# Patient Record
Sex: Male | Born: 1940 | Race: White | Hispanic: No | Marital: Married | Smoking: Former smoker
Health system: Southern US, Community
[De-identification: ages and names within clinical notes are randomized; demographics above are authoritative.]

## PROBLEM LIST (undated history)

## (undated) DIAGNOSIS — D751 Secondary polycythemia: Secondary | ICD-10-CM

## (undated) DIAGNOSIS — R600 Localized edema: Secondary | ICD-10-CM

## (undated) DIAGNOSIS — E785 Hyperlipidemia, unspecified: Secondary | ICD-10-CM

## (undated) DIAGNOSIS — E7800 Pure hypercholesterolemia, unspecified: Secondary | ICD-10-CM

## (undated) DIAGNOSIS — R238 Other skin changes: Secondary | ICD-10-CM

## (undated) DIAGNOSIS — I10 Essential (primary) hypertension: Secondary | ICD-10-CM

## (undated) DIAGNOSIS — I701 Atherosclerosis of renal artery: Secondary | ICD-10-CM

## (undated) DIAGNOSIS — C9110 Chronic lymphocytic leukemia of B-cell type not having achieved remission: Secondary | ICD-10-CM

## (undated) DIAGNOSIS — R609 Edema, unspecified: Secondary | ICD-10-CM

## (undated) DIAGNOSIS — R350 Frequency of micturition: Secondary | ICD-10-CM

## (undated) DIAGNOSIS — I219 Acute myocardial infarction, unspecified: Secondary | ICD-10-CM

## (undated) DIAGNOSIS — I509 Heart failure, unspecified: Secondary | ICD-10-CM

## (undated) DIAGNOSIS — I209 Angina pectoris, unspecified: Secondary | ICD-10-CM

## (undated) DIAGNOSIS — Z951 Presence of aortocoronary bypass graft: Secondary | ICD-10-CM

## (undated) DIAGNOSIS — M1990 Unspecified osteoarthritis, unspecified site: Secondary | ICD-10-CM

## (undated) DIAGNOSIS — Z89519 Acquired absence of unspecified leg below knee: Secondary | ICD-10-CM

## (undated) DIAGNOSIS — I739 Peripheral vascular disease, unspecified: Secondary | ICD-10-CM

## (undated) DIAGNOSIS — N400 Enlarged prostate without lower urinary tract symptoms: Secondary | ICD-10-CM

## (undated) DIAGNOSIS — I2510 Atherosclerotic heart disease of native coronary artery without angina pectoris: Secondary | ICD-10-CM

## (undated) DIAGNOSIS — G4700 Insomnia, unspecified: Secondary | ICD-10-CM

## (undated) DIAGNOSIS — F419 Anxiety disorder, unspecified: Secondary | ICD-10-CM

## (undated) DIAGNOSIS — R233 Spontaneous ecchymoses: Secondary | ICD-10-CM

## (undated) DIAGNOSIS — N183 Chronic kidney disease, stage 3 unspecified: Secondary | ICD-10-CM

## (undated) DIAGNOSIS — I4891 Unspecified atrial fibrillation: Secondary | ICD-10-CM

## (undated) DIAGNOSIS — G629 Polyneuropathy, unspecified: Secondary | ICD-10-CM

## (undated) DIAGNOSIS — Z9049 Acquired absence of other specified parts of digestive tract: Secondary | ICD-10-CM

## (undated) DIAGNOSIS — B029 Zoster without complications: Secondary | ICD-10-CM

## (undated) HISTORY — DX: Unspecified osteoarthritis, unspecified site: M19.90

## (undated) HISTORY — DX: Chronic lymphocytic leukemia of B-cell type not having achieved remission: C91.10

## (undated) HISTORY — DX: Pure hypercholesterolemia, unspecified: E78.00

## (undated) HISTORY — PX: PR VEIN BYPASS GRAFT,AORTO-FEM-POP: 35551

## (undated) HISTORY — DX: Heart failure, unspecified: I50.9

## (undated) HISTORY — PX: ESOPHAGOGASTRODUODENOSCOPY: SHX1529

## (undated) HISTORY — DX: Polyneuropathy, unspecified: G62.9

## (undated) HISTORY — DX: Atherosclerosis of renal artery: I70.1

## (undated) HISTORY — DX: Chronic kidney disease, stage 3 (moderate): N18.3

## (undated) HISTORY — DX: Chronic kidney disease, stage 3 unspecified: N18.30

## (undated) HISTORY — PX: TENDON REPAIR: SHX5111

## (undated) HISTORY — DX: Hyperlipidemia, unspecified: E78.5

## (undated) HISTORY — DX: Peripheral vascular disease, unspecified: I73.9

## (undated) HISTORY — DX: Unspecified atrial fibrillation: I48.91

## (undated) HISTORY — PX: CHOLECYSTECTOMY: SHX55

## (undated) HISTORY — DX: Secondary polycythemia: D75.1

## (undated) HISTORY — PX: TOTAL KNEE ARTHROPLASTY: SHX125

## (undated) HISTORY — PX: BELOW KNEE LEG AMPUTATION: SUR23

## (undated) HISTORY — PX: COLONOSCOPY: SHX174

---

## 1989-04-14 DIAGNOSIS — I219 Acute myocardial infarction, unspecified: Secondary | ICD-10-CM

## 1989-04-14 HISTORY — DX: Acute myocardial infarction, unspecified: I21.9

## 1991-04-15 HISTORY — PX: CORONARY ARTERY BYPASS GRAFT: SHX141

## 1992-04-14 HISTORY — PX: CORONARY ARTERY BYPASS GRAFT: SHX141

## 1996-04-14 HISTORY — PX: JOINT REPLACEMENT: SHX530

## 1997-12-04 ENCOUNTER — Ambulatory Visit (HOSPITAL_COMMUNITY): Admission: RE | Admit: 1997-12-04 | Discharge: 1997-12-04 | Payer: Self-pay | Admitting: Internal Medicine

## 1999-03-14 ENCOUNTER — Encounter: Payer: Self-pay | Admitting: Specialist

## 1999-03-14 ENCOUNTER — Ambulatory Visit (HOSPITAL_COMMUNITY): Admission: RE | Admit: 1999-03-14 | Discharge: 1999-03-14 | Payer: Self-pay | Admitting: Specialist

## 1999-03-26 ENCOUNTER — Encounter: Payer: Self-pay | Admitting: Specialist

## 1999-03-26 ENCOUNTER — Ambulatory Visit (HOSPITAL_COMMUNITY): Admission: RE | Admit: 1999-03-26 | Discharge: 1999-03-26 | Payer: Self-pay | Admitting: Specialist

## 1999-04-29 ENCOUNTER — Ambulatory Visit: Admission: RE | Admit: 1999-04-29 | Discharge: 1999-04-29 | Payer: Self-pay | Admitting: Internal Medicine

## 1999-06-03 ENCOUNTER — Ambulatory Visit (HOSPITAL_COMMUNITY): Admission: RE | Admit: 1999-06-03 | Discharge: 1999-06-03 | Payer: Self-pay | Admitting: *Deleted

## 1999-06-19 ENCOUNTER — Encounter: Payer: Self-pay | Admitting: *Deleted

## 1999-06-19 ENCOUNTER — Encounter: Admission: RE | Admit: 1999-06-19 | Discharge: 1999-06-19 | Payer: Self-pay | Admitting: *Deleted

## 2000-10-05 ENCOUNTER — Encounter: Admission: RE | Admit: 2000-10-05 | Discharge: 2000-10-05 | Payer: Self-pay | Admitting: Internal Medicine

## 2000-10-05 ENCOUNTER — Encounter: Payer: Self-pay | Admitting: Internal Medicine

## 2001-03-15 ENCOUNTER — Ambulatory Visit (HOSPITAL_COMMUNITY): Admission: RE | Admit: 2001-03-15 | Discharge: 2001-03-15 | Payer: Self-pay | Admitting: Cardiology

## 2001-03-15 ENCOUNTER — Encounter: Payer: Self-pay | Admitting: Cardiology

## 2001-04-14 HISTORY — PX: DISTAL CLAVICLE EXCISION: SHX1463

## 2001-04-14 HISTORY — PX: RENAL ARTERY STENT: SHX2321

## 2001-05-07 ENCOUNTER — Encounter: Payer: Self-pay | Admitting: Specialist

## 2001-05-07 ENCOUNTER — Ambulatory Visit (HOSPITAL_COMMUNITY): Admission: RE | Admit: 2001-05-07 | Discharge: 2001-05-07 | Payer: Self-pay | Admitting: Specialist

## 2001-08-10 ENCOUNTER — Ambulatory Visit (HOSPITAL_COMMUNITY): Admission: RE | Admit: 2001-08-10 | Discharge: 2001-08-10 | Payer: Self-pay | Admitting: Cardiology

## 2001-08-27 ENCOUNTER — Ambulatory Visit (HOSPITAL_COMMUNITY): Admission: RE | Admit: 2001-08-27 | Discharge: 2001-08-27 | Payer: Self-pay | Admitting: Specialist

## 2001-09-19 ENCOUNTER — Encounter: Payer: Self-pay | Admitting: Orthopaedic Surgery

## 2001-09-19 ENCOUNTER — Inpatient Hospital Stay (HOSPITAL_COMMUNITY): Admission: EM | Admit: 2001-09-19 | Discharge: 2001-09-23 | Payer: Self-pay | Admitting: Specialist

## 2001-09-23 ENCOUNTER — Encounter: Payer: Self-pay | Admitting: Specialist

## 2002-04-14 HISTORY — PX: CORONARY ANGIOPLASTY WITH STENT PLACEMENT: SHX49

## 2002-06-24 ENCOUNTER — Encounter: Payer: Self-pay | Admitting: Emergency Medicine

## 2002-06-24 ENCOUNTER — Inpatient Hospital Stay (HOSPITAL_COMMUNITY): Admission: EM | Admit: 2002-06-24 | Discharge: 2002-06-28 | Payer: Self-pay | Admitting: Emergency Medicine

## 2003-11-21 ENCOUNTER — Ambulatory Visit (HOSPITAL_COMMUNITY): Admission: RE | Admit: 2003-11-21 | Discharge: 2003-11-21 | Payer: Self-pay | Admitting: Internal Medicine

## 2003-11-30 ENCOUNTER — Ambulatory Visit (HOSPITAL_COMMUNITY): Admission: RE | Admit: 2003-11-30 | Discharge: 2003-11-30 | Payer: Self-pay | Admitting: Vascular Surgery

## 2003-12-12 ENCOUNTER — Ambulatory Visit (HOSPITAL_COMMUNITY): Admission: RE | Admit: 2003-12-12 | Discharge: 2003-12-12 | Payer: Self-pay | Admitting: Vascular Surgery

## 2003-12-21 ENCOUNTER — Ambulatory Visit (HOSPITAL_COMMUNITY): Admission: RE | Admit: 2003-12-21 | Discharge: 2003-12-21 | Payer: Self-pay | Admitting: Vascular Surgery

## 2004-03-03 ENCOUNTER — Ambulatory Visit: Payer: Self-pay | Admitting: Oncology

## 2004-04-14 HISTORY — PX: CARDIAC CATHETERIZATION: SHX172

## 2004-04-29 ENCOUNTER — Ambulatory Visit: Payer: Self-pay | Admitting: Oncology

## 2004-08-01 ENCOUNTER — Ambulatory Visit: Payer: Self-pay | Admitting: Oncology

## 2004-09-26 ENCOUNTER — Ambulatory Visit: Payer: Self-pay | Admitting: Oncology

## 2004-11-21 ENCOUNTER — Ambulatory Visit: Payer: Self-pay | Admitting: Oncology

## 2005-01-29 ENCOUNTER — Inpatient Hospital Stay (HOSPITAL_COMMUNITY): Admission: EM | Admit: 2005-01-29 | Discharge: 2005-01-31 | Payer: Self-pay | Admitting: Emergency Medicine

## 2005-02-21 ENCOUNTER — Ambulatory Visit: Payer: Self-pay | Admitting: Oncology

## 2005-03-28 ENCOUNTER — Ambulatory Visit (HOSPITAL_COMMUNITY): Admission: RE | Admit: 2005-03-28 | Discharge: 2005-03-28 | Payer: Self-pay | Admitting: Cardiovascular Disease

## 2005-04-10 ENCOUNTER — Ambulatory Visit: Payer: Self-pay | Admitting: Oncology

## 2005-05-08 ENCOUNTER — Ambulatory Visit: Payer: Self-pay | Admitting: Oncology

## 2005-05-24 ENCOUNTER — Inpatient Hospital Stay (HOSPITAL_COMMUNITY): Admission: EM | Admit: 2005-05-24 | Discharge: 2005-05-27 | Payer: Self-pay | Admitting: Emergency Medicine

## 2005-05-26 ENCOUNTER — Encounter: Payer: Self-pay | Admitting: Cardiovascular Disease

## 2005-06-23 ENCOUNTER — Ambulatory Visit: Payer: Self-pay | Admitting: Oncology

## 2005-08-04 LAB — CBC WITH DIFFERENTIAL/PLATELET
BASO%: 0.5 % (ref 0.0–2.0)
HCT: 39.2 % (ref 38.7–49.9)
MCHC: 30.4 g/dL — ABNORMAL LOW (ref 32.0–35.9)
MONO#: 1.1 10*3/uL — ABNORMAL HIGH (ref 0.1–0.9)
NEUT%: 80.7 % — ABNORMAL HIGH (ref 40.0–75.0)
RBC: 5.6 10*6/uL (ref 4.20–5.71)
RDW: 19.4 % — ABNORMAL HIGH (ref 11.2–14.6)
WBC: 11.5 10*3/uL — ABNORMAL HIGH (ref 4.0–10.0)
lymph#: 0.8 10*3/uL — ABNORMAL LOW (ref 0.9–3.3)

## 2005-09-15 ENCOUNTER — Ambulatory Visit: Payer: Self-pay | Admitting: Oncology

## 2005-09-15 LAB — CBC WITH DIFFERENTIAL/PLATELET
BASO%: 0.6 % (ref 0.0–2.0)
LYMPH%: 4.7 % — ABNORMAL LOW (ref 14.0–48.0)
MCHC: 30.3 g/dL — ABNORMAL LOW (ref 32.0–35.9)
MONO#: 1.3 10*3/uL — ABNORMAL HIGH (ref 0.1–0.9)
Platelets: 364 10*3/uL (ref 145–400)
RBC: 5.55 10*6/uL (ref 4.20–5.71)
WBC: 14.2 10*3/uL — ABNORMAL HIGH (ref 4.0–10.0)
lymph#: 0.7 10*3/uL — ABNORMAL LOW (ref 0.9–3.3)

## 2005-10-13 ENCOUNTER — Encounter: Admission: RE | Admit: 2005-10-13 | Discharge: 2005-10-13 | Payer: Self-pay | Admitting: Internal Medicine

## 2005-10-16 ENCOUNTER — Encounter: Admission: RE | Admit: 2005-10-16 | Discharge: 2005-10-16 | Payer: Self-pay | Admitting: Internal Medicine

## 2005-10-17 ENCOUNTER — Ambulatory Visit (HOSPITAL_COMMUNITY): Admission: RE | Admit: 2005-10-17 | Discharge: 2005-10-17 | Payer: Self-pay | Admitting: Internal Medicine

## 2005-10-27 ENCOUNTER — Ambulatory Visit: Payer: Self-pay | Admitting: Oncology

## 2005-10-27 LAB — CBC WITH DIFFERENTIAL/PLATELET
BASO%: 0.7 % (ref 0.0–2.0)
HCT: 42.2 % (ref 38.7–49.9)
HGB: 12.9 g/dL — ABNORMAL LOW (ref 13.0–17.1)
LYMPH%: 6.7 % — ABNORMAL LOW (ref 14.0–48.0)
MONO%: 11.2 % (ref 0.0–13.0)
NEUT#: 9.7 10*3/uL — ABNORMAL HIGH (ref 1.5–6.5)
RBC: 5.85 10*6/uL — ABNORMAL HIGH (ref 4.20–5.71)
WBC: 12.2 10*3/uL — ABNORMAL HIGH (ref 4.0–10.0)

## 2005-12-02 ENCOUNTER — Ambulatory Visit: Payer: Self-pay | Admitting: Oncology

## 2005-12-08 LAB — CBC WITH DIFFERENTIAL/PLATELET
BASO%: 0.1 % (ref 0.0–2.0)
EOS%: 2.2 % (ref 0.0–7.0)
HCT: 41.4 % (ref 38.7–49.9)
MCH: 22.6 pg — ABNORMAL LOW (ref 28.0–33.4)
MCHC: 31 g/dL — ABNORMAL LOW (ref 32.0–35.9)
MONO#: 1.4 10*3/uL — ABNORMAL HIGH (ref 0.1–0.9)
NEUT%: 85 % — ABNORMAL HIGH (ref 40.0–75.0)
RBC: 5.7 10*6/uL (ref 4.20–5.71)
RDW: 19.2 % — ABNORMAL HIGH (ref 11.2–14.6)
WBC: 17.1 10*3/uL — ABNORMAL HIGH (ref 4.0–10.0)
lymph#: 0.7 10*3/uL — ABNORMAL LOW (ref 0.9–3.3)

## 2006-01-12 ENCOUNTER — Inpatient Hospital Stay (HOSPITAL_COMMUNITY): Admission: EM | Admit: 2006-01-12 | Discharge: 2006-01-18 | Payer: Self-pay | Admitting: Emergency Medicine

## 2006-01-15 ENCOUNTER — Ambulatory Visit: Payer: Self-pay | Admitting: Oncology

## 2006-01-23 ENCOUNTER — Inpatient Hospital Stay (HOSPITAL_COMMUNITY): Admission: EM | Admit: 2006-01-23 | Discharge: 2006-01-25 | Payer: Self-pay | Admitting: Emergency Medicine

## 2006-01-24 ENCOUNTER — Encounter (INDEPENDENT_AMBULATORY_CARE_PROVIDER_SITE_OTHER): Payer: Self-pay | Admitting: *Deleted

## 2006-03-20 ENCOUNTER — Ambulatory Visit: Payer: Self-pay

## 2006-03-26 ENCOUNTER — Ambulatory Visit (HOSPITAL_COMMUNITY): Admission: RE | Admit: 2006-03-26 | Discharge: 2006-03-26 | Payer: Self-pay | Admitting: Vascular Surgery

## 2006-03-31 ENCOUNTER — Ambulatory Visit: Payer: Self-pay | Admitting: Oncology

## 2006-04-03 LAB — CBC WITH DIFFERENTIAL/PLATELET
BASO%: 0.1 % (ref 0.0–2.0)
EOS%: 4.5 % (ref 0.0–7.0)
LYMPH%: 7.2 % — ABNORMAL LOW (ref 14.0–48.0)
MCH: 22.1 pg — ABNORMAL LOW (ref 28.0–33.4)
MCHC: 30.5 g/dL — ABNORMAL LOW (ref 32.0–35.9)
MONO#: 1.3 10*3/uL — ABNORMAL HIGH (ref 0.1–0.9)
Platelets: 379 10*3/uL (ref 145–400)
RBC: 6.15 10*6/uL — ABNORMAL HIGH (ref 4.20–5.71)
WBC: 13.9 10*3/uL — ABNORMAL HIGH (ref 4.0–10.0)

## 2006-06-02 ENCOUNTER — Ambulatory Visit: Payer: Self-pay | Admitting: Oncology

## 2006-06-05 LAB — COMPREHENSIVE METABOLIC PANEL
ALT: 16 U/L (ref 0–53)
AST: 15 U/L (ref 0–37)
Albumin: 4.1 g/dL (ref 3.5–5.2)
CO2: 29 mEq/L (ref 19–32)
Calcium: 9.5 mg/dL (ref 8.4–10.5)
Chloride: 98 mEq/L (ref 96–112)
Creatinine, Ser: 2.17 mg/dL — ABNORMAL HIGH (ref 0.40–1.50)
Potassium: 4.1 mEq/L (ref 3.5–5.3)
Total Protein: 7.2 g/dL (ref 6.0–8.3)

## 2006-06-05 LAB — CBC WITH DIFFERENTIAL/PLATELET
Basophils Absolute: 0 10*3/uL (ref 0.0–0.1)
Eosinophils Absolute: 0.4 10*3/uL (ref 0.0–0.5)
HCT: 42.1 % (ref 38.7–49.9)
HGB: 13 g/dL (ref 13.0–17.1)
MONO#: 1.1 10*3/uL — ABNORMAL HIGH (ref 0.1–0.9)
NEUT#: 11.2 10*3/uL — ABNORMAL HIGH (ref 1.5–6.5)
NEUT%: 83.5 % — ABNORMAL HIGH (ref 40.0–75.0)
WBC: 13.4 10*3/uL — ABNORMAL HIGH (ref 4.0–10.0)
lymph#: 0.7 10*3/uL — ABNORMAL LOW (ref 0.9–3.3)

## 2006-09-02 ENCOUNTER — Ambulatory Visit: Payer: Self-pay | Admitting: Oncology

## 2006-09-04 LAB — CBC WITH DIFFERENTIAL/PLATELET
BASO%: 1.1 % (ref 0.0–2.0)
Basophils Absolute: 0.1 10*3/uL (ref 0.0–0.1)
EOS%: 2.6 % (ref 0.0–7.0)
Eosinophils Absolute: 0.3 10*3/uL (ref 0.0–0.5)
HCT: 42.9 % (ref 38.7–49.9)
HGB: 13.3 g/dL (ref 13.0–17.1)
LYMPH%: 4.9 % — ABNORMAL LOW (ref 14.0–48.0)
MCH: 21.9 pg — ABNORMAL LOW (ref 28.0–33.4)
MCHC: 31 g/dL — ABNORMAL LOW (ref 32.0–35.9)
MCV: 70.7 fL — ABNORMAL LOW (ref 81.6–98.0)
MONO#: 1.5 10*3/uL — ABNORMAL HIGH (ref 0.1–0.9)
MONO%: 11.5 % (ref 0.0–13.0)
NEUT#: 10.5 10*3/uL — ABNORMAL HIGH (ref 1.5–6.5)
NEUT%: 79.9 % — ABNORMAL HIGH (ref 40.0–75.0)
Platelets: 326 10*3/uL (ref 145–400)
RBC: 6.06 10*6/uL — ABNORMAL HIGH (ref 4.20–5.71)
RDW: 20.1 % — ABNORMAL HIGH (ref 11.2–14.6)
WBC: 13.2 10*3/uL — ABNORMAL HIGH (ref 4.0–10.0)
lymph#: 0.6 10*3/uL — ABNORMAL LOW (ref 0.9–3.3)

## 2006-09-09 ENCOUNTER — Encounter: Admission: RE | Admit: 2006-09-09 | Discharge: 2006-09-09 | Payer: Self-pay | Admitting: Internal Medicine

## 2006-09-16 ENCOUNTER — Encounter: Admission: RE | Admit: 2006-09-16 | Discharge: 2006-09-16 | Payer: Self-pay | Admitting: Cardiovascular Disease

## 2006-09-16 ENCOUNTER — Ambulatory Visit: Payer: Self-pay | Admitting: Vascular Surgery

## 2006-12-02 ENCOUNTER — Ambulatory Visit: Payer: Self-pay | Admitting: Oncology

## 2006-12-04 LAB — CBC WITH DIFFERENTIAL/PLATELET
Basophils Absolute: 0.1 10*3/uL (ref 0.0–0.1)
EOS%: 2.3 % (ref 0.0–7.0)
HCT: 46.8 % (ref 38.7–49.9)
HGB: 14.5 g/dL (ref 13.0–17.1)
MCH: 22.2 pg — ABNORMAL LOW (ref 28.0–33.4)
MONO#: 1.5 10*3/uL — ABNORMAL HIGH (ref 0.1–0.9)
NEUT#: 11.9 10*3/uL — ABNORMAL HIGH (ref 1.5–6.5)
NEUT%: 81.2 % — ABNORMAL HIGH (ref 40.0–75.0)
RDW: 19.4 % — ABNORMAL HIGH (ref 11.2–14.6)
WBC: 14.6 10*3/uL — ABNORMAL HIGH (ref 4.0–10.0)
lymph#: 0.9 10*3/uL (ref 0.9–3.3)

## 2007-03-02 ENCOUNTER — Ambulatory Visit: Payer: Self-pay | Admitting: Oncology

## 2007-03-04 LAB — CBC WITH DIFFERENTIAL/PLATELET
Basophils Absolute: 0 10*3/uL (ref 0.0–0.1)
Eosinophils Absolute: 0.3 10*3/uL (ref 0.0–0.5)
HCT: 41.5 % (ref 38.7–49.9)
HGB: 13 g/dL (ref 13.0–17.1)
MCH: 21.7 pg — ABNORMAL LOW (ref 28.0–33.4)
MCV: 69.6 fL — ABNORMAL LOW (ref 81.6–98.0)
MONO%: 8.9 % (ref 0.0–13.0)
NEUT#: 13.6 10*3/uL — ABNORMAL HIGH (ref 1.5–6.5)
NEUT%: 85.7 % — ABNORMAL HIGH (ref 40.0–75.0)
RDW: 19.7 % — ABNORMAL HIGH (ref 11.2–14.6)
lymph#: 0.6 10*3/uL — ABNORMAL LOW (ref 0.9–3.3)

## 2007-04-21 ENCOUNTER — Encounter: Admission: RE | Admit: 2007-04-21 | Discharge: 2007-04-21 | Payer: Self-pay | Admitting: Orthopedic Surgery

## 2007-06-01 ENCOUNTER — Ambulatory Visit: Payer: Self-pay | Admitting: Oncology

## 2007-06-02 LAB — CBC WITH DIFFERENTIAL/PLATELET
BASO%: 0 % (ref 0.0–2.0)
LYMPH%: 2.9 % — ABNORMAL LOW (ref 14.0–48.0)
MCH: 22.8 pg — ABNORMAL LOW (ref 28.0–33.4)
MCHC: 31.5 g/dL — ABNORMAL LOW (ref 32.0–35.9)
MCV: 72.4 fL — ABNORMAL LOW (ref 81.6–98.0)
MONO%: 10.2 % (ref 0.0–13.0)
Platelets: 596 10*3/uL — ABNORMAL HIGH (ref 145–400)
RBC: 6.41 10*6/uL — ABNORMAL HIGH (ref 4.20–5.71)

## 2007-06-29 ENCOUNTER — Inpatient Hospital Stay (HOSPITAL_COMMUNITY): Admission: EM | Admit: 2007-06-29 | Discharge: 2007-07-02 | Payer: Self-pay | Admitting: Emergency Medicine

## 2007-07-09 ENCOUNTER — Ambulatory Visit: Payer: Self-pay | Admitting: Oncology

## 2007-07-13 LAB — CBC WITH DIFFERENTIAL/PLATELET
BASO%: 0.5 % (ref 0.0–2.0)
Basophils Absolute: 0.1 10*3/uL (ref 0.0–0.1)
EOS%: 1.3 % (ref 0.0–7.0)
Eosinophils Absolute: 0.2 10*3/uL (ref 0.0–0.5)
HCT: 39.2 % (ref 38.7–49.9)
HGB: 12.2 g/dL — ABNORMAL LOW (ref 13.0–17.1)
LYMPH%: 3.5 % — ABNORMAL LOW (ref 14.0–48.0)
MCH: 23.6 pg — ABNORMAL LOW (ref 28.0–33.4)
MCHC: 31.3 g/dL — ABNORMAL LOW (ref 32.0–35.9)
MCV: 75.3 fL — ABNORMAL LOW (ref 81.6–98.0)
MONO#: 1.5 10*3/uL — ABNORMAL HIGH (ref 0.1–0.9)
MONO%: 8.4 % (ref 0.0–13.0)
NEUT#: 14.9 10*3/uL — ABNORMAL HIGH (ref 1.5–6.5)
NEUT%: 86.3 % — ABNORMAL HIGH (ref 40.0–75.0)
Platelets: 541 10*3/uL — ABNORMAL HIGH (ref 145–400)
RBC: 5.2 10*6/uL (ref 4.20–5.71)
RDW: 20.2 % — ABNORMAL HIGH (ref 11.2–14.6)
WBC: 17.2 10*3/uL — ABNORMAL HIGH (ref 4.0–10.0)
lymph#: 0.6 10*3/uL — ABNORMAL LOW (ref 0.9–3.3)

## 2007-07-20 ENCOUNTER — Ambulatory Visit: Payer: Self-pay | Admitting: Vascular Surgery

## 2007-08-31 ENCOUNTER — Ambulatory Visit: Payer: Self-pay | Admitting: Oncology

## 2007-09-02 LAB — CBC WITH DIFFERENTIAL/PLATELET
Basophils Absolute: 0 10*3/uL (ref 0.0–0.1)
Eosinophils Absolute: 0.5 10*3/uL (ref 0.0–0.5)
HGB: 13.5 g/dL (ref 13.0–17.1)
MCV: 70.7 fL — ABNORMAL LOW (ref 81.6–98.0)
MONO%: 7.5 % (ref 0.0–13.0)
NEUT#: 18.4 10*3/uL — ABNORMAL HIGH (ref 1.5–6.5)
Platelets: 268 10*3/uL (ref 145–400)
RDW: 19.8 % — ABNORMAL HIGH (ref 11.2–14.6)

## 2007-12-31 ENCOUNTER — Ambulatory Visit: Payer: Self-pay | Admitting: Oncology

## 2008-01-03 ENCOUNTER — Ambulatory Visit: Payer: Self-pay | Admitting: Vascular Surgery

## 2008-01-04 LAB — CBC WITH DIFFERENTIAL/PLATELET
Basophils Absolute: 0.1 10*3/uL (ref 0.0–0.1)
EOS%: 2.3 % (ref 0.0–7.0)
Eosinophils Absolute: 0.4 10*3/uL (ref 0.0–0.5)
HGB: 14.1 g/dL (ref 13.0–17.1)
MONO#: 1.3 10*3/uL — ABNORMAL HIGH (ref 0.1–0.9)
NEUT#: 15.6 10*3/uL — ABNORMAL HIGH (ref 1.5–6.5)
RDW: 19.9 % — ABNORMAL HIGH (ref 11.2–14.6)
WBC: 17.9 10*3/uL — ABNORMAL HIGH (ref 4.0–10.0)
lymph#: 0.6 10*3/uL — ABNORMAL LOW (ref 0.9–3.3)

## 2008-01-18 ENCOUNTER — Ambulatory Visit: Payer: Self-pay | Admitting: Vascular Surgery

## 2008-02-29 ENCOUNTER — Ambulatory Visit: Payer: Self-pay | Admitting: Oncology

## 2008-03-02 LAB — CBC WITH DIFFERENTIAL/PLATELET
BASO%: 0.2 % (ref 0.0–2.0)
EOS%: 2.1 % (ref 0.0–7.0)
MCH: 21.6 pg — ABNORMAL LOW (ref 28.0–33.4)
MCHC: 29.8 g/dL — ABNORMAL LOW (ref 32.0–35.9)
RDW: 20.2 % — ABNORMAL HIGH (ref 11.2–14.6)
lymph#: 0.7 10*3/uL — ABNORMAL LOW (ref 0.9–3.3)

## 2008-05-30 ENCOUNTER — Ambulatory Visit: Payer: Self-pay | Admitting: Oncology

## 2008-06-02 LAB — CBC WITH DIFFERENTIAL/PLATELET
Basophils Absolute: 0.1 10*3/uL (ref 0.0–0.1)
Eosinophils Absolute: 0.3 10*3/uL (ref 0.0–0.5)
HGB: 13.5 g/dL (ref 13.0–17.1)
LYMPH%: 3.9 % — ABNORMAL LOW (ref 14.0–49.0)
MCV: 77.9 fL — ABNORMAL LOW (ref 79.3–98.0)
MONO%: 9.2 % (ref 0.0–14.0)
NEUT#: 13.7 10*3/uL — ABNORMAL HIGH (ref 1.5–6.5)
Platelets: 156 10*3/uL (ref 140–400)

## 2008-07-14 LAB — CBC WITH DIFFERENTIAL/PLATELET
Basophils Absolute: 0 10*3/uL (ref 0.0–0.1)
Eosinophils Absolute: 0.3 10*3/uL (ref 0.0–0.5)
HGB: 13.7 g/dL (ref 13.0–17.1)
MONO#: 1.6 10*3/uL — ABNORMAL HIGH (ref 0.1–0.9)
NEUT#: 13.2 10*3/uL — ABNORMAL HIGH (ref 1.5–6.5)
RDW: 18.7 % — ABNORMAL HIGH (ref 11.0–14.6)
lymph#: 0.9 10*3/uL (ref 0.9–3.3)

## 2008-08-25 ENCOUNTER — Ambulatory Visit: Payer: Self-pay | Admitting: Oncology

## 2008-08-29 LAB — CBC WITH DIFFERENTIAL/PLATELET
BASO%: 0.2 % (ref 0.0–2.0)
Basophils Absolute: 0 10*3/uL (ref 0.0–0.1)
EOS%: 0.8 % (ref 0.0–7.0)
HGB: 15.8 g/dL (ref 13.0–17.1)
MCH: 22.4 pg — ABNORMAL LOW (ref 27.2–33.4)
RDW: 19.2 % — ABNORMAL HIGH (ref 11.0–14.6)
lymph#: 0.7 10*3/uL — ABNORMAL LOW (ref 0.9–3.3)

## 2008-09-20 LAB — CBC WITH DIFFERENTIAL/PLATELET
BASO%: 0.6 % (ref 0.0–2.0)
EOS%: 1.2 % (ref 0.0–7.0)
LYMPH%: 4.8 % — ABNORMAL LOW (ref 14.0–49.0)
MCH: 23 pg — ABNORMAL LOW (ref 27.2–33.4)
MCHC: 29.2 g/dL — ABNORMAL LOW (ref 32.0–36.0)
MCV: 78.5 fL — ABNORMAL LOW (ref 79.3–98.0)
MONO%: 8.7 % (ref 0.0–14.0)
Platelets: 312 10*3/uL (ref 140–400)
RBC: 6.23 10*6/uL — ABNORMAL HIGH (ref 4.20–5.82)
nRBC: 0 % (ref 0–0)

## 2008-10-02 ENCOUNTER — Ambulatory Visit: Payer: Self-pay | Admitting: Oncology

## 2008-10-04 LAB — CBC WITH DIFFERENTIAL/PLATELET
BASO%: 0.5 % (ref 0.0–2.0)
EOS%: 1.1 % (ref 0.0–7.0)
HCT: 44 % (ref 38.4–49.9)
MCH: 23.1 pg — ABNORMAL LOW (ref 27.2–33.4)
MCHC: 29.3 g/dL — ABNORMAL LOW (ref 32.0–36.0)
NEUT%: 85.8 % — ABNORMAL HIGH (ref 39.0–75.0)
RBC: 5.59 10*6/uL (ref 4.20–5.82)
RDW: 18.5 % — ABNORMAL HIGH (ref 11.0–14.6)
lymph#: 0.6 10*3/uL — ABNORMAL LOW (ref 0.9–3.3)

## 2008-10-18 LAB — CBC WITH DIFFERENTIAL/PLATELET
Basophils Absolute: 0.1 10*3/uL (ref 0.0–0.1)
EOS%: 1.7 % (ref 0.0–7.0)
Eosinophils Absolute: 0.3 10*3/uL (ref 0.0–0.5)
HGB: 11.7 g/dL — ABNORMAL LOW (ref 13.0–17.1)
LYMPH%: 4.8 % — ABNORMAL LOW (ref 14.0–49.0)
MCH: 22.4 pg — ABNORMAL LOW (ref 27.2–33.4)
MCV: 77.8 fL — ABNORMAL LOW (ref 79.3–98.0)
MONO%: 7.3 % (ref 0.0–14.0)
NEUT#: 14.9 10*3/uL — ABNORMAL HIGH (ref 1.5–6.5)
Platelets: 264 10*3/uL (ref 140–400)

## 2008-12-19 ENCOUNTER — Ambulatory Visit: Payer: Self-pay | Admitting: Oncology

## 2008-12-21 LAB — CBC WITH DIFFERENTIAL/PLATELET
BASO%: 0 % (ref 0.0–2.0)
Basophils Absolute: 0 10*3/uL (ref 0.0–0.1)
EOS%: 1.7 % (ref 0.0–7.0)
HCT: 40.7 % (ref 38.4–49.9)
HGB: 12 g/dL — ABNORMAL LOW (ref 13.0–17.1)
LYMPH%: 5.1 % — ABNORMAL LOW (ref 14.0–49.0)
MCH: 21.4 pg — ABNORMAL LOW (ref 27.2–33.4)
MCHC: 29.4 g/dL — ABNORMAL LOW (ref 32.0–36.0)
MCV: 72.9 fL — ABNORMAL LOW (ref 79.3–98.0)
MONO%: 5.7 % (ref 0.0–14.0)
NEUT%: 87.5 % — ABNORMAL HIGH (ref 39.0–75.0)
lymph#: 0.9 10*3/uL (ref 0.9–3.3)

## 2009-02-05 ENCOUNTER — Inpatient Hospital Stay (HOSPITAL_COMMUNITY): Admission: EM | Admit: 2009-02-05 | Discharge: 2009-02-08 | Payer: Self-pay | Admitting: Emergency Medicine

## 2009-05-04 ENCOUNTER — Ambulatory Visit: Payer: Self-pay | Admitting: Oncology

## 2009-05-04 LAB — CBC WITH DIFFERENTIAL/PLATELET
Basophils Absolute: 0 10*3/uL (ref 0.0–0.1)
Eosinophils Absolute: 0.4 10*3/uL (ref 0.0–0.5)
HCT: 46.3 % (ref 38.4–49.9)
HGB: 14 g/dL (ref 13.0–17.1)
LYMPH%: 3.2 % — ABNORMAL LOW (ref 14.0–49.0)
MONO#: 1.8 10*3/uL — ABNORMAL HIGH (ref 0.1–0.9)
NEUT#: 22.7 10*3/uL — ABNORMAL HIGH (ref 1.5–6.5)
NEUT%: 88.3 % — ABNORMAL HIGH (ref 39.0–75.0)
Platelets: 349 10*3/uL (ref 140–400)
WBC: 25.7 10*3/uL — ABNORMAL HIGH (ref 4.0–10.3)
lymph#: 0.8 10*3/uL — ABNORMAL LOW (ref 0.9–3.3)

## 2009-05-31 LAB — CBC WITH DIFFERENTIAL/PLATELET
BASO%: 0 % (ref 0.0–2.0)
Basophils Absolute: 0 10*3/uL (ref 0.0–0.1)
EOS%: 1.5 % (ref 0.0–7.0)
HCT: 47.5 % (ref 38.4–49.9)
HGB: 14.1 g/dL (ref 13.0–17.1)
MCH: 23.1 pg — ABNORMAL LOW (ref 27.2–33.4)
MCHC: 29.7 g/dL — ABNORMAL LOW (ref 32.0–36.0)
MCV: 77.9 fL — ABNORMAL LOW (ref 79.3–98.0)
MONO%: 5 % (ref 0.0–14.0)
NEUT%: 91.1 % — ABNORMAL HIGH (ref 39.0–75.0)
lymph#: 0.7 10*3/uL — ABNORMAL LOW (ref 0.9–3.3)

## 2009-06-26 ENCOUNTER — Ambulatory Visit: Payer: Self-pay | Admitting: Oncology

## 2009-06-28 LAB — CBC WITH DIFFERENTIAL/PLATELET
BASO%: 0.1 % (ref 0.0–2.0)
Basophils Absolute: 0 10*3/uL (ref 0.0–0.1)
EOS%: 1.6 % (ref 0.0–7.0)
HCT: 46.3 % (ref 38.4–49.9)
HGB: 14 g/dL (ref 13.0–17.1)
LYMPH%: 4.2 % — ABNORMAL LOW (ref 14.0–49.0)
MCH: 24.3 pg — ABNORMAL LOW (ref 27.2–33.4)
MCHC: 30.1 g/dL — ABNORMAL LOW (ref 32.0–36.0)
MONO#: 1.3 10*3/uL — ABNORMAL HIGH (ref 0.1–0.9)
NEUT%: 87.2 % — ABNORMAL HIGH (ref 39.0–75.0)
Platelets: 231 10*3/uL (ref 140–400)

## 2009-07-17 ENCOUNTER — Inpatient Hospital Stay (HOSPITAL_COMMUNITY): Admission: RE | Admit: 2009-07-17 | Discharge: 2009-07-24 | Payer: Self-pay | Admitting: Orthopedic Surgery

## 2009-09-24 ENCOUNTER — Encounter: Admission: RE | Admit: 2009-09-24 | Discharge: 2009-09-24 | Payer: Self-pay | Admitting: Internal Medicine

## 2009-09-26 ENCOUNTER — Ambulatory Visit: Payer: Self-pay | Admitting: Vascular Surgery

## 2009-10-05 ENCOUNTER — Ambulatory Visit (HOSPITAL_COMMUNITY): Admission: RE | Admit: 2009-10-05 | Discharge: 2009-10-05 | Payer: Self-pay | Admitting: Vascular Surgery

## 2009-10-05 ENCOUNTER — Ambulatory Visit: Payer: Self-pay | Admitting: Vascular Surgery

## 2009-10-08 ENCOUNTER — Encounter: Admission: RE | Admit: 2009-10-08 | Discharge: 2009-10-08 | Payer: Self-pay | Admitting: Orthopedic Surgery

## 2009-10-10 ENCOUNTER — Ambulatory Visit: Payer: Self-pay | Admitting: Vascular Surgery

## 2009-10-12 ENCOUNTER — Encounter: Payer: Self-pay | Admitting: Vascular Surgery

## 2009-10-12 ENCOUNTER — Inpatient Hospital Stay (HOSPITAL_COMMUNITY): Admission: RE | Admit: 2009-10-12 | Discharge: 2009-10-17 | Payer: Self-pay | Admitting: Vascular Surgery

## 2009-10-16 ENCOUNTER — Ambulatory Visit: Payer: Self-pay | Admitting: Physical Medicine & Rehabilitation

## 2009-10-17 ENCOUNTER — Inpatient Hospital Stay (HOSPITAL_COMMUNITY)
Admission: RE | Admit: 2009-10-17 | Discharge: 2009-10-27 | Payer: Self-pay | Admitting: Physical Medicine & Rehabilitation

## 2009-10-31 ENCOUNTER — Ambulatory Visit: Payer: Self-pay | Admitting: Vascular Surgery

## 2009-11-06 ENCOUNTER — Inpatient Hospital Stay (HOSPITAL_COMMUNITY): Admission: EM | Admit: 2009-11-06 | Discharge: 2009-11-19 | Payer: Self-pay | Admitting: Emergency Medicine

## 2009-11-07 ENCOUNTER — Ambulatory Visit: Payer: Self-pay | Admitting: Surgery

## 2009-11-09 ENCOUNTER — Ambulatory Visit: Payer: Self-pay | Admitting: Infectious Diseases

## 2009-12-03 ENCOUNTER — Encounter
Admission: RE | Admit: 2009-12-03 | Discharge: 2010-03-03 | Payer: Self-pay | Admitting: Physical Medicine & Rehabilitation

## 2009-12-06 ENCOUNTER — Ambulatory Visit: Payer: Self-pay | Admitting: Vascular Surgery

## 2009-12-20 ENCOUNTER — Ambulatory Visit: Payer: Self-pay | Admitting: Vascular Surgery

## 2010-01-03 ENCOUNTER — Ambulatory Visit: Payer: Self-pay | Admitting: Vascular Surgery

## 2010-01-24 ENCOUNTER — Ambulatory Visit: Payer: Self-pay | Admitting: Vascular Surgery

## 2010-03-06 ENCOUNTER — Encounter
Admission: RE | Admit: 2010-03-06 | Discharge: 2010-04-10 | Payer: Self-pay | Source: Home / Self Care | Attending: Vascular Surgery | Admitting: Vascular Surgery

## 2010-04-03 ENCOUNTER — Encounter: Admission: RE | Admit: 2010-04-03 | Payer: Self-pay | Source: Home / Self Care | Admitting: Vascular Surgery

## 2010-04-15 ENCOUNTER — Encounter
Admission: RE | Admit: 2010-04-15 | Discharge: 2010-05-14 | Payer: Self-pay | Source: Home / Self Care | Attending: Vascular Surgery | Admitting: Vascular Surgery

## 2010-05-13 ENCOUNTER — Encounter: Admit: 2010-05-13 | Payer: Self-pay | Admitting: Vascular Surgery

## 2010-05-13 ENCOUNTER — Encounter: Admit: 2010-05-13 | Discharge: 2010-05-14 | Payer: Self-pay | Attending: Vascular Surgery | Admitting: Vascular Surgery

## 2010-05-15 ENCOUNTER — Ambulatory Visit: Payer: MEDICARE | Attending: Vascular Surgery | Admitting: Physical Therapy

## 2010-05-15 DIAGNOSIS — G20A1 Parkinson's disease without dyskinesia, without mention of fluctuations: Secondary | ICD-10-CM | POA: Insufficient documentation

## 2010-05-15 DIAGNOSIS — G20 Parkinson's disease: Secondary | ICD-10-CM | POA: Insufficient documentation

## 2010-05-15 DIAGNOSIS — IMO0001 Reserved for inherently not codable concepts without codable children: Secondary | ICD-10-CM | POA: Insufficient documentation

## 2010-05-15 DIAGNOSIS — R269 Unspecified abnormalities of gait and mobility: Secondary | ICD-10-CM | POA: Insufficient documentation

## 2010-05-20 ENCOUNTER — Ambulatory Visit: Payer: MEDICARE | Admitting: Physical Therapy

## 2010-05-22 ENCOUNTER — Ambulatory Visit: Payer: MEDICARE | Admitting: Physical Therapy

## 2010-05-27 ENCOUNTER — Ambulatory Visit: Payer: MEDICARE | Admitting: Physical Therapy

## 2010-05-29 ENCOUNTER — Encounter: Payer: Self-pay | Admitting: Physical Therapy

## 2010-06-03 ENCOUNTER — Encounter: Payer: Self-pay | Admitting: Physical Therapy

## 2010-06-05 ENCOUNTER — Encounter: Payer: Self-pay | Admitting: Physical Therapy

## 2010-06-28 LAB — BASIC METABOLIC PANEL
BUN: 31 mg/dL — ABNORMAL HIGH (ref 6–23)
BUN: 37 mg/dL — ABNORMAL HIGH (ref 6–23)
BUN: 39 mg/dL — ABNORMAL HIGH (ref 6–23)
CO2: 29 mEq/L (ref 19–32)
Calcium: 9 mg/dL (ref 8.4–10.5)
Calcium: 9.1 mg/dL (ref 8.4–10.5)
Chloride: 96 mEq/L (ref 96–112)
Chloride: 96 mEq/L (ref 96–112)
Creatinine, Ser: 1.38 mg/dL (ref 0.4–1.5)
Creatinine, Ser: 1.5 mg/dL (ref 0.4–1.5)
Creatinine, Ser: 1.74 mg/dL — ABNORMAL HIGH (ref 0.4–1.5)
GFR calc Af Amer: 56 mL/min — ABNORMAL LOW (ref >60)
GFR calc Af Amer: >60 mL/min (ref >60)
GFR calc non Af Amer: 51 mL/min — ABNORMAL LOW (ref >60)

## 2010-06-28 LAB — MAGNESIUM: Magnesium: 2.1 mg/dL (ref 1.5–2.5)

## 2010-06-28 LAB — GLUCOSE, CAPILLARY
Glucose-Capillary: 101 mg/dL — ABNORMAL HIGH (ref 70–99)
Glucose-Capillary: 113 mg/dL — ABNORMAL HIGH (ref 70–99)
Glucose-Capillary: 116 mg/dL — ABNORMAL HIGH (ref 70–99)
Glucose-Capillary: 120 mg/dL — ABNORMAL HIGH (ref 70–99)
Glucose-Capillary: 122 mg/dL — ABNORMAL HIGH (ref 70–99)
Glucose-Capillary: 130 mg/dL — ABNORMAL HIGH (ref 70–99)
Glucose-Capillary: 133 mg/dL — ABNORMAL HIGH (ref 70–99)
Glucose-Capillary: 135 mg/dL — ABNORMAL HIGH (ref 70–99)
Glucose-Capillary: 136 mg/dL — ABNORMAL HIGH (ref 70–99)
Glucose-Capillary: 142 mg/dL — ABNORMAL HIGH (ref 70–99)
Glucose-Capillary: 149 mg/dL — ABNORMAL HIGH (ref 70–99)
Glucose-Capillary: 159 mg/dL — ABNORMAL HIGH (ref 70–99)
Glucose-Capillary: 160 mg/dL — ABNORMAL HIGH (ref 70–99)
Glucose-Capillary: 161 mg/dL — ABNORMAL HIGH (ref 70–99)
Glucose-Capillary: 163 mg/dL — ABNORMAL HIGH (ref 70–99)
Glucose-Capillary: 164 mg/dL — ABNORMAL HIGH (ref 70–99)
Glucose-Capillary: 165 mg/dL — ABNORMAL HIGH (ref 70–99)
Glucose-Capillary: 192 mg/dL — ABNORMAL HIGH (ref 70–99)
Glucose-Capillary: 195 mg/dL — ABNORMAL HIGH (ref 70–99)
Glucose-Capillary: 198 mg/dL — ABNORMAL HIGH (ref 70–99)
Glucose-Capillary: 227 mg/dL — ABNORMAL HIGH (ref 70–99)

## 2010-06-28 LAB — COMPREHENSIVE METABOLIC PANEL
ALT: 11 U/L (ref 0–53)
AST: 15 U/L (ref 0–37)
Albumin: 2.1 g/dL — ABNORMAL LOW (ref 3.5–5.2)
CO2: 32 mEq/L (ref 19–32)
Calcium: 8.8 mg/dL (ref 8.4–10.5)
Creatinine, Ser: 1.5 mg/dL (ref 0.4–1.5)
GFR calc Af Amer: 56 mL/min — ABNORMAL LOW (ref >60)
GFR calc non Af Amer: 46 mL/min — ABNORMAL LOW (ref >60)
Sodium: 137 mEq/L (ref 135–145)
Total Protein: 5.7 g/dL — ABNORMAL LOW (ref 6.0–8.3)

## 2010-06-28 LAB — WOUND CULTURE

## 2010-06-28 LAB — CBC
Hemoglobin: 10.8 g/dL — ABNORMAL LOW (ref 13.0–17.0)
MCH: 21.5 pg — ABNORMAL LOW (ref 26.0–34.0)
MCHC: 27.8 g/dL — ABNORMAL LOW (ref 30.0–36.0)
Platelets: 408 10*3/uL — ABNORMAL HIGH (ref 150–400)
Platelets: 437 10*3/uL — ABNORMAL HIGH (ref 150–400)
RBC: 4.97 MIL/uL (ref 4.22–5.81)
RDW: 19.1 % — ABNORMAL HIGH (ref 11.5–15.5)
RDW: 19.3 % — ABNORMAL HIGH (ref 11.5–15.5)
WBC: 22.1 10*3/uL — ABNORMAL HIGH (ref 4.0–10.5)

## 2010-06-28 LAB — TISSUE CULTURE

## 2010-06-28 LAB — TYPE AND SCREEN

## 2010-06-29 LAB — DIFFERENTIAL
Basophils Absolute: 0 10*3/uL (ref 0.0–0.1)
Basophils Absolute: 0 10*3/uL (ref 0.0–0.1)
Eosinophils Absolute: 0.3 10*3/uL (ref 0.0–0.7)
Eosinophils Absolute: 0.6 10*3/uL (ref 0.0–0.7)
Eosinophils Relative: 2 % (ref 0–5)
Lymphocytes Relative: 3 % — ABNORMAL LOW (ref 12–46)
Monocytes Absolute: 2.2 10*3/uL — ABNORMAL HIGH (ref 0.1–1.0)
Monocytes Relative: 6 % (ref 3–12)
Neutro Abs: 23.3 10*3/uL — ABNORMAL HIGH (ref 1.7–7.7)
Neutrophils Relative %: 88 % — ABNORMAL HIGH (ref 43–77)
Neutrophils Relative %: 90 % — ABNORMAL HIGH (ref 43–77)

## 2010-06-29 LAB — BASIC METABOLIC PANEL
BUN: 24 mg/dL — ABNORMAL HIGH (ref 6–23)
BUN: 36 mg/dL — ABNORMAL HIGH (ref 6–23)
CO2: 28 mEq/L (ref 19–32)
Calcium: 8.5 mg/dL (ref 8.4–10.5)
Calcium: 8.7 mg/dL (ref 8.4–10.5)
Calcium: 9.2 mg/dL (ref 8.4–10.5)
Chloride: 94 mEq/L — ABNORMAL LOW (ref 96–112)
Chloride: 95 mEq/L — ABNORMAL LOW (ref 96–112)
Chloride: 97 mEq/L (ref 96–112)
Creatinine, Ser: 1.46 mg/dL (ref 0.4–1.5)
Creatinine, Ser: 1.55 mg/dL — ABNORMAL HIGH (ref 0.4–1.5)
Creatinine, Ser: 1.66 mg/dL — ABNORMAL HIGH (ref 0.4–1.5)
GFR calc Af Amer: 50 mL/min — ABNORMAL LOW (ref >60)
GFR calc Af Amer: 54 mL/min — ABNORMAL LOW (ref >60)
GFR calc Af Amer: 58 mL/min — ABNORMAL LOW (ref >60)
GFR calc Af Amer: >60 mL/min (ref >60)
GFR calc non Af Amer: 41 mL/min — ABNORMAL LOW (ref >60)
GFR calc non Af Amer: 44 mL/min — ABNORMAL LOW (ref >60)
GFR calc non Af Amer: 55 mL/min — ABNORMAL LOW (ref >60)
Glucose, Bld: 125 mg/dL — ABNORMAL HIGH (ref 70–99)
Glucose, Bld: 127 mg/dL — ABNORMAL HIGH (ref 70–99)
Sodium: 136 mEq/L (ref 135–145)

## 2010-06-29 LAB — GLUCOSE, CAPILLARY
Glucose-Capillary: 120 mg/dL — ABNORMAL HIGH (ref 70–99)
Glucose-Capillary: 121 mg/dL — ABNORMAL HIGH (ref 70–99)
Glucose-Capillary: 122 mg/dL — ABNORMAL HIGH (ref 70–99)
Glucose-Capillary: 127 mg/dL — ABNORMAL HIGH (ref 70–99)
Glucose-Capillary: 131 mg/dL — ABNORMAL HIGH (ref 70–99)
Glucose-Capillary: 134 mg/dL — ABNORMAL HIGH (ref 70–99)
Glucose-Capillary: 136 mg/dL — ABNORMAL HIGH (ref 70–99)
Glucose-Capillary: 143 mg/dL — ABNORMAL HIGH (ref 70–99)
Glucose-Capillary: 145 mg/dL — ABNORMAL HIGH (ref 70–99)
Glucose-Capillary: 159 mg/dL — ABNORMAL HIGH (ref 70–99)
Glucose-Capillary: 167 mg/dL — ABNORMAL HIGH (ref 70–99)
Glucose-Capillary: 169 mg/dL — ABNORMAL HIGH (ref 70–99)
Glucose-Capillary: 172 mg/dL — ABNORMAL HIGH (ref 70–99)
Glucose-Capillary: 86 mg/dL (ref 70–99)
Glucose-Capillary: 87 mg/dL (ref 70–99)
Glucose-Capillary: 99 mg/dL (ref 70–99)

## 2010-06-29 LAB — CBC
Hemoglobin: 10.3 g/dL — ABNORMAL LOW (ref 13.0–17.0)
Hemoglobin: 11.3 g/dL — ABNORMAL LOW (ref 13.0–17.0)
MCH: 22.6 pg — ABNORMAL LOW (ref 26.0–34.0)
MCHC: 30.2 g/dL (ref 30.0–36.0)
MCV: 75.4 fL — ABNORMAL LOW (ref 78.0–100.0)
MCV: 75.6 fL — ABNORMAL LOW (ref 78.0–100.0)
Platelets: 413 10*3/uL — ABNORMAL HIGH (ref 150–400)
Platelets: 419 10*3/uL — ABNORMAL HIGH (ref 150–400)
Platelets: 453 10*3/uL — ABNORMAL HIGH (ref 150–400)
Platelets: 456 10*3/uL — ABNORMAL HIGH (ref 150–400)
RBC: 5.01 MIL/uL (ref 4.22–5.81)
RBC: 5.12 MIL/uL (ref 4.22–5.81)
RDW: 21.1 % — ABNORMAL HIGH (ref 11.5–15.5)
RDW: 22 % — ABNORMAL HIGH (ref 11.5–15.5)
WBC: 26 10*3/uL — ABNORMAL HIGH (ref 4.0–10.5)
WBC: 27.3 10*3/uL — ABNORMAL HIGH (ref 4.0–10.5)

## 2010-06-29 LAB — URINE CULTURE: Culture: NO GROWTH

## 2010-06-29 LAB — CULTURE, BLOOD (ROUTINE X 2)
Culture: NO GROWTH
Culture: NO GROWTH

## 2010-06-29 LAB — WOUND CULTURE

## 2010-06-29 LAB — URINALYSIS, ROUTINE W REFLEX MICROSCOPIC
Nitrite: NEGATIVE
Specific Gravity, Urine: 1.021 (ref 1.005–1.030)
Urobilinogen, UA: 1 mg/dL (ref 0.0–1.0)

## 2010-06-29 LAB — URINE MICROSCOPIC-ADD ON

## 2010-06-29 LAB — CROSSMATCH
ABO/RH(D): A POS
Antibody Screen: NEGATIVE

## 2010-06-29 LAB — PROTIME-INR
INR: 1.19 (ref 0.00–1.49)
Prothrombin Time: 15 seconds (ref 11.6–15.2)

## 2010-06-30 LAB — BASIC METABOLIC PANEL
BUN: 31 mg/dL — ABNORMAL HIGH (ref 6–23)
BUN: 52 mg/dL — ABNORMAL HIGH (ref 6–23)
BUN: 54 mg/dL — ABNORMAL HIGH (ref 6–23)
BUN: 58 mg/dL — ABNORMAL HIGH (ref 6–23)
CO2: 34 mEq/L — ABNORMAL HIGH (ref 19–32)
CO2: 35 mEq/L — ABNORMAL HIGH (ref 19–32)
CO2: 37 mEq/L — ABNORMAL HIGH (ref 19–32)
CO2: 37 mEq/L — ABNORMAL HIGH (ref 19–32)
Calcium: 8.5 mg/dL (ref 8.4–10.5)
Calcium: 8.5 mg/dL (ref 8.4–10.5)
Chloride: 87 mEq/L — ABNORMAL LOW (ref 96–112)
Chloride: 92 mEq/L — ABNORMAL LOW (ref 96–112)
Chloride: 94 mEq/L — ABNORMAL LOW (ref 96–112)
Chloride: 98 mEq/L (ref 96–112)
Creatinine, Ser: 1.44 mg/dL (ref 0.4–1.5)
Creatinine, Ser: 1.47 mg/dL (ref 0.4–1.5)
Creatinine, Ser: 1.55 mg/dL — ABNORMAL HIGH (ref 0.4–1.5)
GFR calc Af Amer: 57 mL/min — ABNORMAL LOW (ref >60)
GFR calc Af Amer: 59 mL/min — ABNORMAL LOW (ref >60)
GFR calc non Af Amer: 40 mL/min — ABNORMAL LOW (ref >60)
GFR calc non Af Amer: 46 mL/min — ABNORMAL LOW (ref >60)
Glucose, Bld: 111 mg/dL — ABNORMAL HIGH (ref 70–99)
Glucose, Bld: 130 mg/dL — ABNORMAL HIGH (ref 70–99)
Glucose, Bld: 136 mg/dL — ABNORMAL HIGH (ref 70–99)
Potassium: 3.1 mEq/L — ABNORMAL LOW (ref 3.5–5.1)
Potassium: 3.7 mEq/L (ref 3.5–5.1)
Potassium: 3.8 mEq/L (ref 3.5–5.1)
Potassium: 3.8 mEq/L (ref 3.5–5.1)
Sodium: 133 mEq/L — ABNORMAL LOW (ref 135–145)
Sodium: 134 mEq/L — ABNORMAL LOW (ref 135–145)
Sodium: 135 mEq/L (ref 135–145)
Sodium: 139 mEq/L (ref 135–145)
Sodium: 140 mEq/L (ref 135–145)

## 2010-06-30 LAB — GLUCOSE, CAPILLARY
Glucose-Capillary: 101 mg/dL — ABNORMAL HIGH (ref 70–99)
Glucose-Capillary: 105 mg/dL — ABNORMAL HIGH (ref 70–99)
Glucose-Capillary: 107 mg/dL — ABNORMAL HIGH (ref 70–99)
Glucose-Capillary: 107 mg/dL — ABNORMAL HIGH (ref 70–99)
Glucose-Capillary: 108 mg/dL — ABNORMAL HIGH (ref 70–99)
Glucose-Capillary: 110 mg/dL — ABNORMAL HIGH (ref 70–99)
Glucose-Capillary: 115 mg/dL — ABNORMAL HIGH (ref 70–99)
Glucose-Capillary: 119 mg/dL — ABNORMAL HIGH (ref 70–99)
Glucose-Capillary: 120 mg/dL — ABNORMAL HIGH (ref 70–99)
Glucose-Capillary: 124 mg/dL — ABNORMAL HIGH (ref 70–99)
Glucose-Capillary: 125 mg/dL — ABNORMAL HIGH (ref 70–99)
Glucose-Capillary: 128 mg/dL — ABNORMAL HIGH (ref 70–99)
Glucose-Capillary: 129 mg/dL — ABNORMAL HIGH (ref 70–99)
Glucose-Capillary: 129 mg/dL — ABNORMAL HIGH (ref 70–99)
Glucose-Capillary: 130 mg/dL — ABNORMAL HIGH (ref 70–99)
Glucose-Capillary: 134 mg/dL — ABNORMAL HIGH (ref 70–99)
Glucose-Capillary: 134 mg/dL — ABNORMAL HIGH (ref 70–99)
Glucose-Capillary: 136 mg/dL — ABNORMAL HIGH (ref 70–99)
Glucose-Capillary: 139 mg/dL — ABNORMAL HIGH (ref 70–99)
Glucose-Capillary: 139 mg/dL — ABNORMAL HIGH (ref 70–99)
Glucose-Capillary: 146 mg/dL — ABNORMAL HIGH (ref 70–99)
Glucose-Capillary: 159 mg/dL — ABNORMAL HIGH (ref 70–99)
Glucose-Capillary: 160 mg/dL — ABNORMAL HIGH (ref 70–99)
Glucose-Capillary: 161 mg/dL — ABNORMAL HIGH (ref 70–99)
Glucose-Capillary: 161 mg/dL — ABNORMAL HIGH (ref 70–99)
Glucose-Capillary: 161 mg/dL — ABNORMAL HIGH (ref 70–99)
Glucose-Capillary: 168 mg/dL — ABNORMAL HIGH (ref 70–99)
Glucose-Capillary: 171 mg/dL — ABNORMAL HIGH (ref 70–99)
Glucose-Capillary: 174 mg/dL — ABNORMAL HIGH (ref 70–99)
Glucose-Capillary: 177 mg/dL — ABNORMAL HIGH (ref 70–99)
Glucose-Capillary: 197 mg/dL — ABNORMAL HIGH (ref 70–99)
Glucose-Capillary: 219 mg/dL — ABNORMAL HIGH (ref 70–99)
Glucose-Capillary: 302 mg/dL — ABNORMAL HIGH (ref 70–99)
Glucose-Capillary: 35 mg/dL — CL (ref 70–99)
Glucose-Capillary: 60 mg/dL — ABNORMAL LOW (ref 70–99)
Glucose-Capillary: 61 mg/dL — ABNORMAL LOW (ref 70–99)
Glucose-Capillary: 65 mg/dL — ABNORMAL LOW (ref 70–99)
Glucose-Capillary: 84 mg/dL (ref 70–99)
Glucose-Capillary: 89 mg/dL (ref 70–99)
Glucose-Capillary: 91 mg/dL (ref 70–99)

## 2010-06-30 LAB — URINE CULTURE
Colony Count: 4000
Special Requests: NEGATIVE

## 2010-06-30 LAB — DIFFERENTIAL
Basophils Absolute: 0 10*3/uL (ref 0.0–0.1)
Basophils Relative: 0 % (ref 0–1)
Basophils Relative: 0 % (ref 0–1)
Eosinophils Relative: 1 % (ref 0–5)
Eosinophils Relative: 1 % (ref 0–5)
Lymphocytes Relative: 2 % — ABNORMAL LOW (ref 12–46)
Lymphs Abs: 0.8 10*3/uL (ref 0.7–4.0)
Lymphs Abs: 0.9 10*3/uL (ref 0.7–4.0)
Monocytes Absolute: 3.2 10*3/uL — ABNORMAL HIGH (ref 0.1–1.0)
Monocytes Relative: 10 % (ref 3–12)
Neutro Abs: 35.7 10*3/uL — ABNORMAL HIGH (ref 1.7–7.7)
Neutro Abs: 40.2 10*3/uL — ABNORMAL HIGH (ref 1.7–7.7)

## 2010-06-30 LAB — URINALYSIS, MICROSCOPIC ONLY
Bilirubin Urine: NEGATIVE
Hgb urine dipstick: NEGATIVE
Ketones, ur: NEGATIVE mg/dL
Nitrite: NEGATIVE
Protein, ur: NEGATIVE mg/dL
Urobilinogen, UA: 1 mg/dL (ref 0.0–1.0)

## 2010-06-30 LAB — CBC
HCT: 37.2 % — ABNORMAL LOW (ref 39.0–52.0)
HCT: 41.3 % (ref 39.0–52.0)
Hemoglobin: 11.4 g/dL — ABNORMAL LOW (ref 13.0–17.0)
Hemoglobin: 11.4 g/dL — ABNORMAL LOW (ref 13.0–17.0)
Hemoglobin: 11.9 g/dL — ABNORMAL LOW (ref 13.0–17.0)
MCH: 23.9 pg — ABNORMAL LOW (ref 26.0–34.0)
MCH: 24.1 pg — ABNORMAL LOW (ref 26.0–34.0)
MCHC: 30.8 g/dL (ref 30.0–36.0)
MCHC: 31.3 g/dL (ref 30.0–36.0)
MCV: 78.6 fL (ref 78.0–100.0)
MCV: 79.5 fL (ref 78.0–100.0)
Platelets: 380 10*3/uL (ref 150–400)
Platelets: 438 10*3/uL — ABNORMAL HIGH (ref 150–400)
RBC: 4.73 MIL/uL (ref 4.22–5.81)
RBC: 4.79 MIL/uL (ref 4.22–5.81)
RDW: 21.3 % — ABNORMAL HIGH (ref 11.5–15.5)

## 2010-06-30 LAB — COMPREHENSIVE METABOLIC PANEL
ALT: 18 U/L (ref 0–53)
AST: 24 U/L (ref 0–37)
Albumin: 2.6 g/dL — ABNORMAL LOW (ref 3.5–5.2)
CO2: 36 mEq/L — ABNORMAL HIGH (ref 19–32)
Calcium: 8.4 mg/dL (ref 8.4–10.5)
GFR calc Af Amer: 47 mL/min — ABNORMAL LOW (ref >60)
GFR calc non Af Amer: 39 mL/min — ABNORMAL LOW (ref >60)
Sodium: 129 mEq/L — ABNORMAL LOW (ref 135–145)

## 2010-07-01 LAB — POCT I-STAT, CHEM 8
Chloride: 97 mEq/L (ref 96–112)
Glucose, Bld: 121 mg/dL — ABNORMAL HIGH (ref 70–99)
HCT: 51 % (ref 39.0–52.0)
Hemoglobin: 17.3 g/dL — ABNORMAL HIGH (ref 13.0–17.0)
Potassium: 3.4 mEq/L — ABNORMAL LOW (ref 3.5–5.1)
Sodium: 139 mEq/L (ref 135–145)

## 2010-07-01 LAB — GLUCOSE, CAPILLARY
Glucose-Capillary: 103 mg/dL — ABNORMAL HIGH (ref 70–99)
Glucose-Capillary: 108 mg/dL — ABNORMAL HIGH (ref 70–99)

## 2010-07-01 LAB — SURGICAL PCR SCREEN
MRSA, PCR: NEGATIVE
Staphylococcus aureus: NEGATIVE

## 2010-07-01 LAB — POCT I-STAT 4, (NA,K, GLUC, HGB,HCT): Glucose, Bld: 96 mg/dL (ref 70–99)

## 2010-07-03 LAB — GLUCOSE, CAPILLARY
Glucose-Capillary: 101 mg/dL — ABNORMAL HIGH (ref 70–99)
Glucose-Capillary: 107 mg/dL — ABNORMAL HIGH (ref 70–99)
Glucose-Capillary: 111 mg/dL — ABNORMAL HIGH (ref 70–99)
Glucose-Capillary: 121 mg/dL — ABNORMAL HIGH (ref 70–99)
Glucose-Capillary: 121 mg/dL — ABNORMAL HIGH (ref 70–99)
Glucose-Capillary: 136 mg/dL — ABNORMAL HIGH (ref 70–99)
Glucose-Capillary: 138 mg/dL — ABNORMAL HIGH (ref 70–99)
Glucose-Capillary: 148 mg/dL — ABNORMAL HIGH (ref 70–99)
Glucose-Capillary: 149 mg/dL — ABNORMAL HIGH (ref 70–99)
Glucose-Capillary: 167 mg/dL — ABNORMAL HIGH (ref 70–99)
Glucose-Capillary: 170 mg/dL — ABNORMAL HIGH (ref 70–99)
Glucose-Capillary: 172 mg/dL — ABNORMAL HIGH (ref 70–99)
Glucose-Capillary: 234 mg/dL — ABNORMAL HIGH (ref 70–99)
Glucose-Capillary: 91 mg/dL (ref 70–99)
Glucose-Capillary: 93 mg/dL (ref 70–99)
Glucose-Capillary: 94 mg/dL (ref 70–99)

## 2010-07-03 LAB — WOUND CULTURE

## 2010-07-03 LAB — BASIC METABOLIC PANEL
CO2: 32 mEq/L (ref 19–32)
CO2: 34 mEq/L — ABNORMAL HIGH (ref 19–32)
Calcium: 10.1 mg/dL (ref 8.4–10.5)
Calcium: 9 mg/dL (ref 8.4–10.5)
Chloride: 89 mEq/L — ABNORMAL LOW (ref 96–112)
Chloride: 91 mEq/L — ABNORMAL LOW (ref 96–112)
Chloride: 92 mEq/L — ABNORMAL LOW (ref 96–112)
Creatinine, Ser: 1.74 mg/dL — ABNORMAL HIGH (ref 0.4–1.5)
GFR calc Af Amer: 36 mL/min — ABNORMAL LOW (ref >60)
GFR calc Af Amer: 47 mL/min — ABNORMAL LOW (ref >60)
GFR calc Af Amer: 53 mL/min — ABNORMAL LOW (ref >60)
GFR calc non Af Amer: 39 mL/min — ABNORMAL LOW (ref >60)
Glucose, Bld: 147 mg/dL — ABNORMAL HIGH (ref 70–99)
Potassium: 3.9 mEq/L (ref 3.5–5.1)
Potassium: 4.1 mEq/L (ref 3.5–5.1)
Sodium: 137 mEq/L (ref 135–145)
Sodium: 138 mEq/L (ref 135–145)

## 2010-07-03 LAB — CBC
HCT: 44.5 % (ref 39.0–52.0)
Hemoglobin: 13.9 g/dL (ref 13.0–17.0)
MCHC: 31.1 g/dL (ref 30.0–36.0)
RBC: 5.4 MIL/uL (ref 4.22–5.81)
RDW: 26 % — ABNORMAL HIGH (ref 11.5–15.5)

## 2010-07-03 LAB — ANAEROBIC CULTURE

## 2010-07-03 LAB — DIFFERENTIAL
Basophils Relative: 0 % (ref 0–1)
Eosinophils Absolute: 0.4 10*3/uL (ref 0.0–0.7)
Eosinophils Relative: 1 % (ref 0–5)
Monocytes Absolute: 1.1 10*3/uL — ABNORMAL HIGH (ref 0.1–1.0)
Neutro Abs: 33.6 10*3/uL — ABNORMAL HIGH (ref 1.7–7.7)
Neutrophils Relative %: 94 % — ABNORMAL HIGH (ref 43–77)

## 2010-07-03 LAB — HEMOGLOBIN AND HEMATOCRIT, BLOOD
HCT: 39.9 % (ref 39.0–52.0)
Hemoglobin: 12.3 g/dL — ABNORMAL LOW (ref 13.0–17.0)

## 2010-07-03 LAB — GRAM STAIN

## 2010-07-03 LAB — VANCOMYCIN, TROUGH: Vancomycin Tr: 13.9 ug/mL (ref 10.0–20.0)

## 2010-07-09 ENCOUNTER — Encounter (HOSPITAL_COMMUNITY): Payer: Self-pay | Admitting: Radiology

## 2010-07-09 ENCOUNTER — Emergency Department (HOSPITAL_COMMUNITY): Payer: Medicare Other

## 2010-07-09 ENCOUNTER — Inpatient Hospital Stay (HOSPITAL_COMMUNITY)
Admission: EM | Admit: 2010-07-09 | Discharge: 2010-07-22 | DRG: 074 | Disposition: A | Discharge status: Skilled Nursing Facility | Payer: Medicare Other | Source: Ambulatory Visit | Attending: Internal Medicine | Admitting: Internal Medicine

## 2010-07-09 DIAGNOSIS — I4891 Unspecified atrial fibrillation: Secondary | ICD-10-CM | POA: Diagnosis present

## 2010-07-09 DIAGNOSIS — N400 Enlarged prostate without lower urinary tract symptoms: Secondary | ICD-10-CM | POA: Diagnosis present

## 2010-07-09 DIAGNOSIS — I509 Heart failure, unspecified: Secondary | ICD-10-CM | POA: Diagnosis present

## 2010-07-09 DIAGNOSIS — Z951 Presence of aortocoronary bypass graft: Secondary | ICD-10-CM

## 2010-07-09 DIAGNOSIS — F329 Major depressive disorder, single episode, unspecified: Secondary | ICD-10-CM | POA: Diagnosis present

## 2010-07-09 DIAGNOSIS — I2510 Atherosclerotic heart disease of native coronary artery without angina pectoris: Secondary | ICD-10-CM | POA: Diagnosis present

## 2010-07-09 DIAGNOSIS — E1142 Type 2 diabetes mellitus with diabetic polyneuropathy: Secondary | ICD-10-CM | POA: Diagnosis present

## 2010-07-09 DIAGNOSIS — E1149 Type 2 diabetes mellitus with other diabetic neurological complication: Secondary | ICD-10-CM | POA: Diagnosis present

## 2010-07-09 DIAGNOSIS — C9110 Chronic lymphocytic leukemia of B-cell type not having achieved remission: Secondary | ICD-10-CM | POA: Diagnosis present

## 2010-07-09 DIAGNOSIS — Z66 Do not resuscitate: Secondary | ICD-10-CM | POA: Diagnosis present

## 2010-07-09 DIAGNOSIS — Z794 Long term (current) use of insulin: Secondary | ICD-10-CM

## 2010-07-09 DIAGNOSIS — M7989 Other specified soft tissue disorders: Secondary | ICD-10-CM

## 2010-07-09 DIAGNOSIS — M79609 Pain in unspecified limb: Secondary | ICD-10-CM

## 2010-07-09 DIAGNOSIS — F3289 Other specified depressive episodes: Secondary | ICD-10-CM | POA: Diagnosis present

## 2010-07-09 DIAGNOSIS — S88119A Complete traumatic amputation at level between knee and ankle, unspecified lower leg, initial encounter: Secondary | ICD-10-CM

## 2010-07-09 DIAGNOSIS — I739 Peripheral vascular disease, unspecified: Secondary | ICD-10-CM | POA: Diagnosis present

## 2010-07-09 DIAGNOSIS — Z79899 Other long term (current) drug therapy: Secondary | ICD-10-CM

## 2010-07-09 DIAGNOSIS — E876 Hypokalemia: Secondary | ICD-10-CM | POA: Diagnosis present

## 2010-07-09 DIAGNOSIS — G541 Lumbosacral plexus disorders: Principal | ICD-10-CM | POA: Diagnosis present

## 2010-07-09 DIAGNOSIS — D72829 Elevated white blood cell count, unspecified: Secondary | ICD-10-CM | POA: Diagnosis present

## 2010-07-09 DIAGNOSIS — M109 Gout, unspecified: Secondary | ICD-10-CM | POA: Diagnosis not present

## 2010-07-09 DIAGNOSIS — N183 Chronic kidney disease, stage 3 unspecified: Secondary | ICD-10-CM | POA: Diagnosis present

## 2010-07-09 DIAGNOSIS — D45 Polycythemia vera: Secondary | ICD-10-CM | POA: Diagnosis present

## 2010-07-09 DIAGNOSIS — R5381 Other malaise: Secondary | ICD-10-CM | POA: Diagnosis present

## 2010-07-09 DIAGNOSIS — I129 Hypertensive chronic kidney disease with stage 1 through stage 4 chronic kidney disease, or unspecified chronic kidney disease: Secondary | ICD-10-CM | POA: Diagnosis present

## 2010-07-09 HISTORY — DX: Atherosclerotic heart disease of native coronary artery without angina pectoris: I25.10

## 2010-07-09 HISTORY — DX: Acquired absence of unspecified leg below knee: Z89.519

## 2010-07-09 HISTORY — DX: Essential (primary) hypertension: I10

## 2010-07-09 HISTORY — DX: Acquired absence of other specified parts of digestive tract: Z90.49

## 2010-07-09 HISTORY — DX: Presence of aortocoronary bypass graft: Z95.1

## 2010-07-09 HISTORY — DX: Acute myocardial infarction, unspecified: I21.9

## 2010-07-09 LAB — BASIC METABOLIC PANEL
CO2: 32 mEq/L (ref 19–32)
Chloride: 99 mEq/L (ref 96–112)
Creatinine, Ser: 1.86 mg/dL — ABNORMAL HIGH (ref 0.4–1.5)
GFR calc Af Amer: 44 mL/min — ABNORMAL LOW (ref >60)
Potassium: 3.4 mEq/L — ABNORMAL LOW (ref 3.5–5.1)

## 2010-07-09 LAB — APTT: aPTT: 35 seconds (ref 24–37)

## 2010-07-09 LAB — DIFFERENTIAL
Basophils Absolute: 0 10*3/uL (ref 0.0–0.1)
Basophils Relative: 0 % (ref 0–1)
Eosinophils Absolute: 0.3 10*3/uL (ref 0.0–0.7)
Lymphs Abs: 0.8 10*3/uL (ref 0.7–4.0)
Monocytes Relative: 5 % (ref 3–12)
Neutro Abs: 25.4 10*3/uL — ABNORMAL HIGH (ref 1.7–7.7)

## 2010-07-09 LAB — CBC
HCT: 44.9 % (ref 39.0–52.0)
MCH: 19.3 pg — ABNORMAL LOW (ref 26.0–34.0)
MCV: 70 fL — ABNORMAL LOW (ref 78.0–100.0)
Platelets: 412 10*3/uL — ABNORMAL HIGH (ref 150–400)
RBC: 6.41 MIL/uL — ABNORMAL HIGH (ref 4.22–5.81)
WBC: 27.9 10*3/uL — ABNORMAL HIGH (ref 4.0–10.5)

## 2010-07-09 LAB — URINALYSIS, ROUTINE W REFLEX MICROSCOPIC
Glucose, UA: NEGATIVE mg/dL
Hgb urine dipstick: NEGATIVE
Protein, ur: NEGATIVE mg/dL
Specific Gravity, Urine: 1.013 (ref 1.005–1.030)
pH: 7.5 (ref 5.0–8.0)

## 2010-07-10 ENCOUNTER — Other Ambulatory Visit (HOSPITAL_COMMUNITY): Payer: MEDICARE

## 2010-07-10 LAB — CBC
HCT: 46.7 % (ref 39.0–52.0)
MCHC: 28.1 g/dL — ABNORMAL LOW (ref 30.0–36.0)
Platelets: 413 10*3/uL — ABNORMAL HIGH (ref 150–400)
RDW: 19.6 % — ABNORMAL HIGH (ref 11.5–15.5)
WBC: 38.8 10*3/uL — ABNORMAL HIGH (ref 4.0–10.5)

## 2010-07-10 LAB — DIFFERENTIAL
Basophils Absolute: 0 10*3/uL (ref 0.0–0.1)
Basophils Relative: 0 % (ref 0–1)
Eosinophils Absolute: 0 10*3/uL (ref 0.0–0.7)
Lymphocytes Relative: 2 % — ABNORMAL LOW (ref 12–46)
Monocytes Absolute: 3.9 10*3/uL — ABNORMAL HIGH (ref 0.1–1.0)
Neutrophils Relative %: 88 % — ABNORMAL HIGH (ref 43–77)

## 2010-07-10 LAB — COMPREHENSIVE METABOLIC PANEL
ALT: 17 U/L (ref 0–53)
AST: 26 U/L (ref 0–37)
CO2: 20 mEq/L (ref 19–32)
Chloride: 110 mEq/L (ref 96–112)
Creatinine, Ser: 2.03 mg/dL — ABNORMAL HIGH (ref 0.4–1.5)
GFR calc Af Amer: 39 mL/min — ABNORMAL LOW (ref >60)
GFR calc non Af Amer: 33 mL/min — ABNORMAL LOW (ref >60)
Glucose, Bld: 108 mg/dL — ABNORMAL HIGH (ref 70–99)
Sodium: 152 mEq/L — ABNORMAL HIGH (ref 135–145)
Total Bilirubin: 2.4 mg/dL — ABNORMAL HIGH (ref 0.3–1.2)

## 2010-07-10 LAB — CARDIAC PANEL(CRET KIN+CKTOT+MB+TROPI)
CK, MB: 6.3 ng/mL (ref 0.3–4.0)
Relative Index: INVALID (ref 0.0–2.5)
Troponin I: 0.13 ng/mL — ABNORMAL HIGH (ref 0.00–0.06)

## 2010-07-10 LAB — MAGNESIUM: Magnesium: 2.2 mg/dL (ref 1.5–2.5)

## 2010-07-10 LAB — GLUCOSE, CAPILLARY
Glucose-Capillary: 138 mg/dL — ABNORMAL HIGH (ref 70–99)
Glucose-Capillary: 137 mg/dL — ABNORMAL HIGH (ref 70–99)
Glucose-Capillary: 122 mg/dL — ABNORMAL HIGH (ref 70–99)

## 2010-07-10 LAB — PHOSPHORUS: Phosphorus: 3.5 mg/dL (ref 2.3–4.6)

## 2010-07-10 LAB — URINE CULTURE: Culture  Setup Time: 201203270021

## 2010-07-10 LAB — MRSA PCR SCREENING: MRSA by PCR: NEGATIVE

## 2010-07-10 LAB — LIPID PANEL: Cholesterol: 120 mg/dL (ref 0–200)

## 2010-07-11 LAB — GLUCOSE, CAPILLARY
Glucose-Capillary: 133 mg/dL — ABNORMAL HIGH (ref 70–99)
Glucose-Capillary: 186 mg/dL — ABNORMAL HIGH (ref 70–99)

## 2010-07-11 LAB — CBC
HCT: 43.5 % (ref 39.0–52.0)
Hemoglobin: 11.9 g/dL — ABNORMAL LOW (ref 13.0–17.0)
MCH: 19.6 pg — ABNORMAL LOW (ref 26.0–34.0)
MCHC: 27.4 g/dL — ABNORMAL LOW (ref 30.0–36.0)
MCV: 71.5 fL — ABNORMAL LOW (ref 78.0–100.0)

## 2010-07-11 LAB — C-REACTIVE PROTEIN: CRP: 12.3 mg/dL — ABNORMAL HIGH (ref <0.6)

## 2010-07-11 LAB — BASIC METABOLIC PANEL
CO2: 28 mEq/L (ref 19–32)
Calcium: 9.1 mg/dL (ref 8.4–10.5)
GFR calc Af Amer: 42 mL/min — ABNORMAL LOW (ref >60)
Potassium: 3.9 mEq/L (ref 3.5–5.1)
Sodium: 142 mEq/L (ref 135–145)

## 2010-07-11 LAB — URIC ACID: Uric Acid, Serum: 9.1 mg/dL — ABNORMAL HIGH (ref 4.0–7.8)

## 2010-07-12 ENCOUNTER — Inpatient Hospital Stay (HOSPITAL_COMMUNITY): Payer: Medicare Other

## 2010-07-12 LAB — GLUCOSE, CAPILLARY
Glucose-Capillary: 187 mg/dL — ABNORMAL HIGH (ref 70–99)
Glucose-Capillary: 184 mg/dL — ABNORMAL HIGH (ref 70–99)
Glucose-Capillary: 165 mg/dL — ABNORMAL HIGH (ref 70–99)

## 2010-07-13 LAB — BASIC METABOLIC PANEL
BUN: 49 mg/dL — ABNORMAL HIGH (ref 6–23)
Calcium: 9.1 mg/dL (ref 8.4–10.5)
Chloride: 102 mEq/L (ref 96–112)
Creatinine, Ser: 1.73 mg/dL — ABNORMAL HIGH (ref 0.4–1.5)
GFR calc Af Amer: 47 mL/min — ABNORMAL LOW (ref >60)

## 2010-07-13 LAB — CBC
Hemoglobin: 12.7 g/dL — ABNORMAL LOW (ref 13.0–17.0)
MCH: 19.8 pg — ABNORMAL LOW (ref 26.0–34.0)
MCHC: 28 g/dL — ABNORMAL LOW (ref 30.0–36.0)
Platelets: 439 10*3/uL — ABNORMAL HIGH (ref 150–400)
RDW: 19.9 % — ABNORMAL HIGH (ref 11.5–15.5)

## 2010-07-13 LAB — ALDOLASE: Aldolase: 14.1 U/L — ABNORMAL HIGH (ref <=8.1)

## 2010-07-13 LAB — GLUCOSE, CAPILLARY
Glucose-Capillary: 148 mg/dL — ABNORMAL HIGH (ref 70–99)
Glucose-Capillary: 243 mg/dL — ABNORMAL HIGH (ref 70–99)

## 2010-07-14 LAB — RENAL FUNCTION PANEL
CO2: 29 mEq/L (ref 19–32)
Calcium: 8.6 mg/dL (ref 8.4–10.5)
Chloride: 100 mEq/L (ref 96–112)
Creatinine, Ser: 1.7 mg/dL — ABNORMAL HIGH (ref 0.4–1.5)
GFR calc Af Amer: 48 mL/min — ABNORMAL LOW (ref >60)
GFR calc non Af Amer: 40 mL/min — ABNORMAL LOW (ref >60)
Glucose, Bld: 190 mg/dL — ABNORMAL HIGH (ref 70–99)

## 2010-07-14 LAB — GLUCOSE, CAPILLARY
Glucose-Capillary: 175 mg/dL — ABNORMAL HIGH (ref 70–99)
Glucose-Capillary: 255 mg/dL — ABNORMAL HIGH (ref 70–99)

## 2010-07-15 LAB — BASIC METABOLIC PANEL
CO2: 31 mEq/L (ref 19–32)
Calcium: 8.7 mg/dL (ref 8.4–10.5)
Creatinine, Ser: 1.64 mg/dL — ABNORMAL HIGH (ref 0.4–1.5)
GFR calc non Af Amer: 42 mL/min — ABNORMAL LOW (ref >60)
Glucose, Bld: 166 mg/dL — ABNORMAL HIGH (ref 70–99)

## 2010-07-15 LAB — GLUCOSE, CAPILLARY
Glucose-Capillary: 188 mg/dL — ABNORMAL HIGH (ref 70–99)
Glucose-Capillary: 198 mg/dL — ABNORMAL HIGH (ref 70–99)

## 2010-07-15 LAB — CBC
HCT: 46.4 % (ref 39.0–52.0)
Hemoglobin: 13 g/dL (ref 13.0–17.0)
MCH: 19.7 pg — ABNORMAL LOW (ref 26.0–34.0)
MCHC: 28 g/dL — ABNORMAL LOW (ref 30.0–36.0)
RDW: 19.7 % — ABNORMAL HIGH (ref 11.5–15.5)

## 2010-07-16 DIAGNOSIS — G9519 Other vascular myelopathies: Secondary | ICD-10-CM

## 2010-07-16 LAB — CULTURE, BLOOD (ROUTINE X 2)
Culture  Setup Time: 201203280836
Culture: NO GROWTH
Culture: NO GROWTH

## 2010-07-16 LAB — BASIC METABOLIC PANEL
Chloride: 100 mEq/L (ref 96–112)
Creatinine, Ser: 1.66 mg/dL — ABNORMAL HIGH (ref 0.4–1.5)
GFR calc Af Amer: 50 mL/min — ABNORMAL LOW (ref >60)
Potassium: 3.5 mEq/L (ref 3.5–5.1)
Sodium: 142 mEq/L (ref 135–145)

## 2010-07-16 LAB — MAGNESIUM: Magnesium: 2.3 mg/dL (ref 1.5–2.5)

## 2010-07-16 LAB — GLUCOSE, CAPILLARY
Glucose-Capillary: 212 mg/dL — ABNORMAL HIGH (ref 70–99)
Glucose-Capillary: 218 mg/dL — ABNORMAL HIGH (ref 70–99)

## 2010-07-16 LAB — VITAMIN B12: Vitamin B-12: 1587 pg/mL — ABNORMAL HIGH (ref 211–911)

## 2010-07-17 ENCOUNTER — Inpatient Hospital Stay (HOSPITAL_COMMUNITY): Payer: Medicare Other

## 2010-07-17 LAB — GLUCOSE, CAPILLARY
Glucose-Capillary: 296 mg/dL — ABNORMAL HIGH (ref 70–99)
Glucose-Capillary: 382 mg/dL — ABNORMAL HIGH (ref 70–99)

## 2010-07-18 LAB — CULTURE, BLOOD (ROUTINE X 2)
Culture: NO GROWTH
Culture: NO GROWTH

## 2010-07-18 LAB — BASIC METABOLIC PANEL
BUN: 30 mg/dL — ABNORMAL HIGH (ref 6–23)
BUN: 32 mg/dL — ABNORMAL HIGH (ref 6–23)
CO2: 31 mEq/L (ref 19–32)
Calcium: 8.7 mg/dL (ref 8.4–10.5)
Chloride: 93 mEq/L — ABNORMAL LOW (ref 96–112)
Chloride: 94 mEq/L — ABNORMAL LOW (ref 96–112)
Chloride: 96 mEq/L (ref 96–112)
Creatinine, Ser: 2.21 mg/dL — ABNORMAL HIGH (ref 0.4–1.5)
Creatinine, Ser: 2.5 mg/dL — ABNORMAL HIGH (ref 0.4–1.5)
GFR calc Af Amer: 31 mL/min — ABNORMAL LOW (ref >60)
GFR calc non Af Amer: 28 mL/min — ABNORMAL LOW (ref >60)
GFR calc non Af Amer: 30 mL/min — ABNORMAL LOW (ref >60)
Glucose, Bld: 112 mg/dL — ABNORMAL HIGH (ref 70–99)
Glucose, Bld: 147 mg/dL — ABNORMAL HIGH (ref 70–99)
Potassium: 3.2 mEq/L — ABNORMAL LOW (ref 3.5–5.1)
Potassium: 3.7 mEq/L (ref 3.5–5.1)
Sodium: 135 mEq/L (ref 135–145)

## 2010-07-18 LAB — DIFFERENTIAL
Basophils Absolute: 0.6 10*3/uL — ABNORMAL HIGH (ref 0.0–0.1)
Basophils Relative: 0 % (ref 0–1)
Basophils Relative: 3 % — ABNORMAL HIGH (ref 0–1)
Eosinophils Absolute: 0.2 10*3/uL (ref 0.0–0.7)
Eosinophils Relative: 1 % (ref 0–5)
Lymphocytes Relative: 3 % — ABNORMAL LOW (ref 12–46)
Lymphocytes Relative: 4 % — ABNORMAL LOW (ref 12–46)
Lymphs Abs: 0.5 10*3/uL — ABNORMAL LOW (ref 0.7–4.0)
Lymphs Abs: 0.8 10*3/uL (ref 0.7–4.0)
Monocytes Absolute: 2.3 10*3/uL — ABNORMAL HIGH (ref 0.1–1.0)
Monocytes Relative: 10 % (ref 3–12)
Monocytes Relative: 11 % (ref 3–12)
Neutro Abs: 15 10*3/uL — ABNORMAL HIGH (ref 1.7–7.7)
Neutro Abs: 16.8 10*3/uL — ABNORMAL HIGH (ref 1.7–7.7)
Neutrophils Relative %: 81 % — ABNORMAL HIGH (ref 43–77)

## 2010-07-18 LAB — GLUCOSE, CAPILLARY
Glucose-Capillary: 111 mg/dL — ABNORMAL HIGH (ref 70–99)
Glucose-Capillary: 120 mg/dL — ABNORMAL HIGH (ref 70–99)
Glucose-Capillary: 128 mg/dL — ABNORMAL HIGH (ref 70–99)
Glucose-Capillary: 140 mg/dL — ABNORMAL HIGH (ref 70–99)
Glucose-Capillary: 146 mg/dL — ABNORMAL HIGH (ref 70–99)
Glucose-Capillary: 149 mg/dL — ABNORMAL HIGH (ref 70–99)
Glucose-Capillary: 161 mg/dL — ABNORMAL HIGH (ref 70–99)
Glucose-Capillary: 168 mg/dL — ABNORMAL HIGH (ref 70–99)
Glucose-Capillary: 78 mg/dL (ref 70–99)
Glucose-Capillary: 339 mg/dL — ABNORMAL HIGH (ref 70–99)

## 2010-07-18 LAB — CBC
HCT: 39 % (ref 39.0–52.0)
HCT: 40 % (ref 39.0–52.0)
HCT: 40.5 % (ref 39.0–52.0)
Hemoglobin: 12 g/dL — ABNORMAL LOW (ref 13.0–17.0)
Hemoglobin: 13.7 g/dL (ref 13.0–17.0)
MCHC: 30.1 g/dL (ref 30.0–36.0)
MCV: 73.5 fL — ABNORMAL LOW (ref 78.0–100.0)
MCV: 73.6 fL — ABNORMAL LOW (ref 78.0–100.0)
MCV: 74.2 fL — ABNORMAL LOW (ref 78.0–100.0)
Platelets: 367 10*3/uL (ref 150–400)
Platelets: 383 10*3/uL (ref 150–400)
RBC: 6.2 MIL/uL — ABNORMAL HIGH (ref 4.22–5.81)
RDW: 21.1 % — ABNORMAL HIGH (ref 11.5–15.5)
WBC: 17.4 10*3/uL — ABNORMAL HIGH (ref 4.0–10.5)
WBC: 23.5 10*3/uL — ABNORMAL HIGH (ref 4.0–10.5)

## 2010-07-18 NOTE — Progress Notes (Signed)
NAME:  Donald Dudley, Donald Dudley NO.:  [id number]  MEDICAL RECORD NO.:  [id number]           PATIENT TYPE:  I  LOCATION:  6705                         FACILITY:  [facility]  PHYSICIAN:  Rosanna Randy, MDDATE OF BIRTH:  Aug 14, 1940                                PROGRESS NOTE   PRIMARY CARE PHYSICIAN: Antony Madura, MD  CHIEF COMPLAINT: Reason for the patient to being admitted was right lower extremity pain, numbness, and paresthesia.  HISTORY OF PRESENT ILLNESS: The patient is a 70 year old gentleman with a multiple medical problems including left BKA, coronary artery disease, who has apparently been doing fine until last couple of days prior to admission when he started having pain, especially around his calf region.  The pain has persisted and radiated now all the way up to his groin.  The patient denies any back pain particularly, but mainly in the lower extremity.  The pain is up to 10 of 10.  The patient reports that the pain is worsened with any slight movement or even touch.  Nothing relieved his pain at home.  The patient denies any nausea, vomiting, or abdominal pain.  He denies any recent infection.  The patient denies any significant swelling or change in color of his leg.  He used to have low back pain for which he used to have a steroid injection in his back in the past, but he has not had that in a while.  While in the emergency department, the patient had spiked a fever of 101.2 rectally.  He denies any fever at home.  Denies any chills, chest pain, cough, shortness of breath, or dysuria.  The patient was admitted into the hospital with concerns for suspected cellulitis, initially received treatment with antibiotics, but findings and physical exam demonstrated a negative workup for infection.  So far the patient has elevated CRP.  The patient has elevated uric acid, positive rheumatoid factor, and elevated aldolase.  He has also a negative ANA and  was started empirically on steroids and Lyrica.  The patient stated that he had 30% improvement after steroids and Lyrica were initiated.  The patient is using IV Dilaudid and also p.o. Vicodin with just mild response.  The patient denies any back pain and radicular pain or pain shooting down his legs, but has been also describing now after hospitalization numbness intermittently on his right lower extremity and also in his right side abdomen, thoracic vertebral eight and down.  The patient without any history of stroke, TIA, or seizures. The patient reports mild peripheral neuropathy and describes paresthesias in his fingertips as well bilaterally.  CURRENT DIAGNOSES: 1. Right lower extremity pain and paresthesia. 2. 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 Elnita Maxwell as an outpatient. 3. Chronic kidney disease with a creatinine at 1.5 to 1.6 baseline is     a stage III. 4. Hypokalemia, secondary to use of diuretics, resolved after     repletion. 5. Gout. 6. Benign prostatic hypertrophy. 7. Depression. 8. Hypertension. 9. Physical deconditioning.  CURRENT MEDICATIONS: At the moment of this dictation includes,  1. Allopurinol 100 mg by mouth daily. 2. Aspirin 81 mg by mouth daily. 3. Atenolol 100 mg by mouth daily. 4. Celexa 20 mg by mouth daily. 5. Colace 100 mg by mouth twice a day. 6. Lovenox shots 40 mg subcutaneously daily. 7. HCTZ 12.5 mg by mouth daily. 8. Hydroxyurea 500 mg by mouth Monday, Wednesday, and Friday. 9. The patient is on sliding scale insulin sensitives and on Novolin 8     units subcutaneously twice a day. 10.The patient is taking Imdur 30 mg by mouth at bedtime. 11.Potassium 20 mEq by mouth daily. 12.Prednisone 60 mg by mouth daily. 13.Lyrica 75 mg by mouth twice a day. 14.Flomax 0.4 mg by mouth daily. 15.Torsemide 20 mg by mouth daily. 16.He is also on p.r.n. Dilaudid and Vicodin.  LABORATORY  DATA: Pertinent laboratory data and images during this hospitalization, the patient had a x-ray of his lumbar spine on the moment of admission July 09, 2010 that demonstrated no acute findings.  There is no evidence of fracture, subluxation.  There is no intervertebral disk space abnormality.  CT of the head done on July 09, 2010, demonstrated no acute intracranial abnormality.  A chest x-ray done on July 09, 2010, demonstrated cardiomegaly without edema, hyperexpansion, and atelectasis with any other acute cardiopulmonary process.  The patient also had a two-view x-ray of his right knee that demonstrated progressive degenerative changes in the medial compartment without acute findings. There is a pending MRI of his back at this moment.  Regarding laboratory data significant test includes, BMET on admission showing a sodium of 140, potassium 3.4, chloride 99, bicarb 32, glucose 162, BUN 44, creatinine 1.86.  CBC with a white blood cells of 28,000, hemoglobin 12.4, platelets 412.  Urinalysis negative.  MRSA PCR screening negative. Lipid profile with a total cholesterol of 120, triglycerides 93, HDL 31, LDL 70.  Cardiac markers done on admission demonstrated just mild elevation of the troponin, but later on decreased almost likely associated with his chronic renal failure in a patient without chest pain, no abnormalities on telemetry and negative ischemic changes on EKG.  The patient had a magnesium of 2.2, phosphorus 3.5, hemoglobin A1c was 6.1, TSH 4.69.  ESR was 2, uric acid 9.1, CRP 12.3, aldolase 14.1, negative ANA, elevated rheumatoid factor at 18, vitamin B12 of 1587.  CONSULTATIONS: Neurology were consulted during this hospitalization in order to investigate any other sources for this patient's paresthesia on the right side of his body.  The patient also had on admission lower extremity Doppler and ABI and arterial Doppler, which were negative with good flow.  No obstruction of  vascular abnormality on his right lower extremity.  HOSPITAL COURSE: 1. Right lower extremity pain and paresthesia with just minimal     response at this moment with the use of steroids and Lyrica.  After     consulting with Neurology and reviewing possible causes for this,     the decision of getting an MRI of thoracic and lumbar spine were     decided in order to rule out that the patient is not having any     spinal cord compression lesions, any infection, any compression     versus making just the diagnosis of diabetic or diabetic lumbar     radiculitis versus degenerative compressive lumbar spine disease.     We are going to follow the results of the MRI.  We are going to     continue steroids.  We are going to continue  Lyrica, p.r.n.     Dilaudid, and also Vicodin.  Continue physical therapy at this     point after first assessment.  Recommendation for physical therapy     is for the patient to being discharged to inpatient rehab at the     moment that his medical evaluation and treatment is completed in     order for the patient to continue having rehab prior to going back     home. 2. Leukocytosis, most likely secondary to CLL.  He is already     following by Dr. Elnita Maxwell as an outpatient.  Plan is to continue his     hydroxyurea while he is in the hospital and to allow Dr. Elnita Maxwell to     reevaluate the patient and adjust medications as needed. 3. Chronic kidney disease, which is currently at baseline with a     creatinine of 1.6.  Plan is to continue to monitorize, increase     serum creatinine time to time. 4. Hypokalemia secondary to the use of diuretics, which have been     resolved after daily maintenance dose of potassium. 5. Gout.  He had the history of gout, had gouty infiltrate on his left     pinky medial phalangeal area and also elevated uric acid in the     setting of right knee pain.  We are going to continue at this point     with allopurinol and he is currently  receiving prednisone, which     will also take care of pain and inflammation. 6. BPH.  Plan is to continue Flomax. 7. Depression.  We are going to continue Celexa. 8. Hypertension.  Plan is to continue using current regimen. 9. Physical deconditioning.  Plan is for the patient to continue     physical therapy, occupational therapy, and at the moment that he     is ready to being discharged.  He will be going to inpatient rehab     in order to have more aggressive therapy prior to going home in     order to improve his strength and stability as well.  PHYSICAL EXAMINATION: VITAL SIGNS:  At the moment of this dictation, the patient's vital signs temperature 98.7, heart rate 58, respiratory rate 18, blood pressure 149/70, oxygen saturation 99% on room air. GENERAL:  The patient was in no acute distress. RESPIRATORY:  Clear to auscultation bilaterally. HEART:  Irregular, but no murmur auscultated. ABDOMEN:  Soft, nontender, nondistended, positive bowel sounds.  There is a positive paresthesia and decreased sensation to light touch on his right side T8 down. EXTREMITIES:  Decreased proprioception, positive paresthesia and 1+ pedal edema in the right lower extremity.  There is a left lower extremity BKA. NEUROLOGIC:  The patient was alert, awake, and oriented x3 and there was no other specific focal deficits.     Rosanna Randy, MD     CEM/MEDQ  D:  07/16/2010  T:  07/16/2010  Job:  [id number]  Electronically Signed by Vassie Loll MD on 07/18/2010 04:39:27 PM

## 2010-07-19 LAB — GLUCOSE, CAPILLARY

## 2010-07-20 LAB — DIFFERENTIAL
Basophils Relative: 0 % (ref 0–1)
Eosinophils Relative: 2 % (ref 0–5)
Lymphocytes Relative: 3 % — ABNORMAL LOW (ref 12–46)
Monocytes Relative: 7 % (ref 3–12)
Neutro Abs: 33 10*3/uL — ABNORMAL HIGH (ref 1.7–7.7)

## 2010-07-20 LAB — CBC
HCT: 44.8 % (ref 39.0–52.0)
Hemoglobin: 12.5 g/dL — ABNORMAL LOW (ref 13.0–17.0)
MCH: 19.2 pg — ABNORMAL LOW (ref 26.0–34.0)
MCV: 68.8 fL — ABNORMAL LOW (ref 78.0–100.0)
RBC: 6.51 MIL/uL — ABNORMAL HIGH (ref 4.22–5.81)

## 2010-07-20 LAB — BASIC METABOLIC PANEL
BUN: 51 mg/dL — ABNORMAL HIGH (ref 6–23)
CO2: 33 mEq/L — ABNORMAL HIGH (ref 19–32)
Chloride: 95 mEq/L — ABNORMAL LOW (ref 96–112)
Creatinine, Ser: 1.67 mg/dL — ABNORMAL HIGH (ref 0.4–1.5)

## 2010-07-20 LAB — GLUCOSE, CAPILLARY: Glucose-Capillary: 162 mg/dL — ABNORMAL HIGH (ref 70–99)

## 2010-07-21 LAB — MAGNESIUM: Magnesium: 2.1 mg/dL (ref 1.5–2.5)

## 2010-07-21 LAB — GLUCOSE, CAPILLARY
Glucose-Capillary: 109 mg/dL — ABNORMAL HIGH (ref 70–99)
Glucose-Capillary: 113 mg/dL — ABNORMAL HIGH (ref 70–99)
Glucose-Capillary: 256 mg/dL — ABNORMAL HIGH (ref 70–99)
Glucose-Capillary: 183 mg/dL — ABNORMAL HIGH (ref 70–99)

## 2010-07-21 LAB — BASIC METABOLIC PANEL
Chloride: 96 mEq/L (ref 96–112)
Creatinine, Ser: 1.7 mg/dL — ABNORMAL HIGH (ref 0.4–1.5)
GFR calc Af Amer: 48 mL/min — ABNORMAL LOW (ref >60)
GFR calc non Af Amer: 40 mL/min — ABNORMAL LOW (ref >60)
Potassium: 3.2 mEq/L — ABNORMAL LOW (ref 3.5–5.1)

## 2010-07-22 LAB — GLUCOSE, CAPILLARY: Glucose-Capillary: 209 mg/dL — ABNORMAL HIGH (ref 70–99)

## 2010-07-22 LAB — BASIC METABOLIC PANEL
BUN: 57 mg/dL — ABNORMAL HIGH (ref 6–23)
CO2: 31 mEq/L (ref 19–32)
GFR calc non Af Amer: 41 mL/min — ABNORMAL LOW (ref >60)
Glucose, Bld: 170 mg/dL — ABNORMAL HIGH (ref 70–99)
Potassium: 3.6 mEq/L (ref 3.5–5.1)

## 2010-07-22 NOTE — Consult Note (Signed)
NAME:  Donald Dudley, Donald Dudley NO.:  [id number]  MEDICAL RECORD NO.:  [id number]           PATIENT TYPE:  I  LOCATION:  6705                         FACILITY:  [facility]  PHYSICIAN:  Ollen Gross, M.D.    DATE OF BIRTH:  1941/01/14  DATE OF CONSULTATION:  07/17/2010 DATE OF DISCHARGE:                                CONSULTATION   PHYSICIAN REQUESTING CONSULTATION:  [facility] hospitalist, Dr. Howell Rucks.  REASON FOR CONSULTATION:  Left small finger pain and swelling, rule out sepsis and right knee swelling.  HISTORY OF PRESENT ILLNESS:  Donald Dudley is a 70 year old male, multiple medical problems, who was admitted to the hospitalist service on March 27 due to acute onset of right lower extremity pain and paresthesia.  He had severe pain and also had a fever in the emergency room.  He has had a thorough evaluation of this.  Neurology had seen him and did not feel that it was any kind of acute neurologic event.  He had MRIs of the lumbar thoracic spine and did not have any acute lesions that would account for his right lower extremity symptoms.  During hospitalization he also was noted to have a uric acid level of 9.1 and had swelling in the knee as well as in the small finger.  There was concern of the small finger because he had a similar problem with his right index finger a year ago and it spontaneously drained and Dr. Melvyn Novas had to perform surgery on the finger.  The patient is having some discomfort in the small finger.  He said that the discomfort in his knee is better.  He is still able to move the finger.  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  He is not having any drainage at this time.  He has not had any recent fevers in the past couple of days.  His medical history is notable for peripheral vascular disease, coronary artery disease status post CABG, type 2 diabetes, status post left below- knee amputation, atrial  fibrillation, prostate hypertrophy, hypertension, congestive heart failure, bilateral renal stenosis with stents, history of CLL, history of gout and hypertension.  PHYSICAL EXAMINATION:  GENERAL:  Well-developed male, frail, oriented and alert, in no apparent distress. MUSCULOSKELETAL:  Evaluation of his right knee shows no effusion.  There is no warmth about the knee.  He is nontender about the knee tonight. His left hand shows that there are hypertrophic changes in all his PIP joints consistent with arthritis.  His left small finger has some swelling adjacent to the PIP joint.  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  He has no significant warmth about the finger.  His range of motion is about 10-60 in the PIP joint.  There is some tenderness to palpation just over the swollen area.  This does not appear to be an abscess.  Notable labs include a white count of 51, but he has chronic leukocytosis.  He also uric acid of 9.1.  X-rays:  No x-rays were taken of the left hand but have been ordered.  ASSESSMENT:  Left small finger proximal interphalangeal joint swelling. 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  I doubt this is a septic joint given the lack of surrounding erythema and his ability to move the proximal interphalangeal even within his limited range of motion.  We will order x-rays to rule out any erosive arthritis.  I do not feel this requires any kind of surgical drainage.  I will notify Dr. Melvyn Novas about this and see if he will be able to assess it further.  As of now we will just obtain the x-rays and hold off any further treatment.     Ollen Gross, M.D.     FA/MEDQ  D:  07/17/2010  T:  07/18/2010  Job:  [id number]  Electronically Signed by Ollen Gross M.D. on 07/22/2010 06:39:52 AM

## 2010-07-29 NOTE — Discharge Summary (Signed)
NAME:  Donald Dudley, Donald Dudley NO.:  [id number]  MEDICAL RECORD NO.:  [id number]           PATIENT TYPE:  I  LOCATION:  6705                         FACILITY:  [facility]  PHYSICIAN:  Rock Nephew, MD       DATE OF BIRTH:  11-03-1940  DATE OF ADMISSION:  07/09/2010 DATE OF DISCHARGE:  07/22/2010                        DISCHARGE SUMMARY - REFERRING   PRIMARY CARE PHYSICIAN:  Dr. Burton Apley.  ORTHOPEDICS CONSULT:  Dr. Ollen Gross.  NEUROLOGY CONSULT:  Dr. Delia Heady.  HEMATOLOGY/ONCOLOGY:  Dr. [name].  DISCHARGE DIAGNOSES: 1. Right leg numbness, lumbar plexopathy, right knee pain resolved,     left pinky finger gouty arthropathy improved. 2. Hypertension, controlled. 3. Diabetes mellitus. 4. 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 5. Chronic kidney disease stage III. 6. Atrial fibrillation, not a candidate for Coumadin. 7. History of left below-the-knee amputation.  DISCHARGE MEDICATIONS: 1. Allopurinol 100 mg by mouth daily. 2. Aspirin 81 mg p.o. daily. 3. Citalopram 20 mg p.o. daily. 4. Hydralazine 30 mg by mouth 3 times a day. 5. NovoLog 1-9 units subcutaneously 3 times a day with meals. 6. Insulin 70/30 insulin 15 units subcutaneously daily before     breakfast. 7. Insulin 70/30 insulin 10 units subcutaneously daily before supper. 8. MiraLax 17 g p.o. daily as needed. 9. Potassium chloride 20 mEq by mouth daily. 10.Lyrica 75 mg by mouth 3 times a day. 11.Atenolol 100 mg by mouth p.o. every morning. 12.Flomax 0.4 mg p.o. every morning. 13.Vicodin 10/500 mg 1 tablet by mouth every 4 hours as needed for     pain. 14.Hydroxyurea 500 mg by mouth on Monday, Wednesday, and Friday. 15.Isosorbide mononitrate XR 1 tablet by mouth every evening. 16.Torsemide 20 mg p.o. every morning. 17.Tylenol 1-2 tablets by mouth every 6 hours as needed. 18.[employer] stool softener 1 tablet by mouth daily at bedtime as   needed. 19.Zolpidem 10 mg 1 tablet p.o. daily at bedtime as needed. 20.Prednisone 30 mg p.o. daily for 2 days on April 09 and April 10,     prednisone 20 mg daily for April 11 and April 12, and prednisone 10     mg p.o. daily for April 13 and April 14 and then stopping.  DIET:  The patient's diet should be carbohydrate modified.  PROCEDURES PERFORMED:  The patient's procedures performed are as follows.  The patient had a CT of the head without contrast which showed no acute intracranial abnormality.  The patient had a one-view chest x- ray on July 09, 2010, which showed cardiomegaly without edema, hyperexpansion, and/or atelectasis.  The patient's right knee x-ray showed progressive degenerative changes in the medial compartment without acute findings.  The patient had an MRI of the T-spine which showed decreased signal intensity of the bone marrow diffusely.  Limited imaging suggests splenomegaly.  Question of infiltrative process such as lymphoma or possibly resultant anemia.  Clinical laboratory correlation recommended, atherosclerotic-type changes, aorta incompletely evaluated on the present exam.  Bilateral pleural effusions greater on the right. Cervical spondylitic changes with spinal stenosis noted but incompletely evaluated.  Altered signal intensity  within the right aspect of the cord extending from T3-T4, etiology is indeterminate.  T8-T9 small left paracentral disk protrusion with slight cord flattening.  MRI of the L- spine showed degenerative changes most notable at L2-L3 as discussed above.  The patient had x-ray of the left hand which showed degenerative changes of the left hand and wrist.  Presumed degenerative cyst in the first metacarpal bone.  No erosive changes identified.  The patient also had ABI of the right lower extremity which showed normal arterial flow in the right lower extremity.  The patient also had a venous Doppler which showed no evidence of deep  vein or superficial thrombus involving the right lower extremity.  FOLLOWUP:  The patient should follow up with the following physicians. The patient should follow up with Dr. Burton Apley his PCP in 2-3 days after discharge.  The patient should follow up with Dr. Delia Heady in 2-3 weeks for outpatient EMG nerve conduction study.  The patient should also follow up with Dr. [name] for further evaluation of the patient's polycythemia vera as well as the chronic lymphocytic leukemia. The patient should also follow up with Dr. Homero Fellers Aluisio as needed.  BRIEF HISTORY OF PRESENT ILLNESS:  Chief complaint is right lower extremity pain, numbness, paresthesias.  This is a 70 year old gentleman with history multiple medical problems including the left below-the-knee amputation, coronary artery disease, was apparently doing fine until the last couple of days when he started having pain especially around his calf region.  In the right lower extremity, the pain persisted and radiated now all the way to his groin.  The patient came to the hospital for further evaluation.  Right leg numbness and weakness.  The patient came to the hospital.  The patient was seen by Neurology.  The patient had multiple radiographic images which showed no definite structural lesion were found.  The patient has been slowly improving on his own. He is taking Lyrica for pain control.  DIAGNOSES: 1. The patient has diagnosis of lumbar plexopathy.  The patient will     need an outpatient EMG nerve conduction study, follow up with     Neurology. 2. Right knee pain.  The patient had some right knee pain.  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 3. Left pinky gouty arthropathy.  The patient had complained of left     pinky pain.  The patient was seen by Dr. Lequita Halt, was thought to be     a left pinky gouty arthropathy.  The patient improved with     steroids. 4.  Hypertension.  The patient's blood pressure has been controlled.     The patient's hypertensives were slightly switched.  The patient's     Dyazide was discontinued.  The patient was placed on hydralazine as     well as Imdur.  The patient was also getting hydrochlorothiazide.     The patient is getting also torsemide 20 mg p.o. every morning. 5. Diabetes mellitus.  The patient received 70/30 insulin on sliding     scale which the patient is still receiving. 6. Chronic lymphocytic leukemia versus polycythemia.  The patient has     been seen by Dr. [name] in the past, and I believe the     patient has received phlebotomies.  The patient has extensive     leukocytosis with no source for infection at 37.5 with the  patient's lymphocyte count is only 3%.  The patient's neutrophils     are 88.  The patient to continue follow up with Dr. [name].     The patient's hemoglobin is only 12.5; hematocrit is 44.8. 7. Chronic kidney disease stage III.  The patient has chronic kidney     disease stage III which is stable. 8. Atrial fibrillation.  The patient has a history paroxysmal atrial     fibrillation.  The patient takes atenolol.  The patient is not a     candidate for anticoagulation. 9. For DVT prophylaxis, the patient has been receiving enoxaparin.  DISPOSITION:  Skilled nursing facility.     Rock Nephew, MD     NH/MEDQ  D:  07/22/2010  T:  07/22/2010  Job:  [id number]  cc:   Ollen Gross, M.D. Pramod P. Pearlean Brownie, MD Jillyn Hidden Dr. Washita Bing, M.D.  Electronically Signed by Rock Nephew MD on 07/29/2010 09:47:32 PM

## 2010-07-30 NOTE — H&P (Signed)
NAME:  DIMAS, SCHECK NO.:  [id number]  MEDICAL RECORD NO.:  [id number]           PATIENT TYPE:  O  LOCATION:  6705                         FACILITY:  [facility]  PHYSICIAN:  Lonia Blood, M.D.      DATE OF BIRTH:  07-27-1940  DATE OF ADMISSION:  07/09/2010 DATE OF DISCHARGE:                             HISTORY & PHYSICAL   PRIMARY CARE PHYSICIAN:  Antony Madura, MD  PRESENTING COMPLAINT:  Right lower extremity pain, numbness, paresthesias.  HISTORY OF PRESENT ILLNESS:  The patient is a 70 year old gentleman with multiple medical problems including left below-knee amputation and coronary artery disease who has apparently been doing fine until the last couple of days when he started having pain especially around his calf region.  The pain has persisted and radiated and now all the way to his groin.  He denied any back pain particularly but mainly in the lower extremity, pain is up to 10/10.  Worsened with any slight movement or even touch.  Nothing relieves his pain at home.  He denied any nausea or vomiting or abdominal pain.  Denied any recent infection.  Denied any significant swelling or change in color.  He used to have low back pain where he used to have steroid injection in his back, but he has not had that in awhile.  While in the ED, the patient had spiked a fever of 101.2 rectally.  He denied fever at home, denied any chills at home.  No chest pain.  No cough, no shortness of breath, no dysuria.  PAST MEDICAL HISTORY:  Significant for: 1. Diabetes, type 2. 2. Coronary artery disease status post CABG. 3. History of peripheral vascular disease status post left below-knee     amputation. 4. Atrial fibrillation. 5. BPH. 6. Hypertension. 7. Congestive heart failure. 8. History of bilateral renal stenosis with stents. 9. History of polycythemia. 10.History of gout. 11.Hypertension.  ALLERGIES:  Multiple drugs including AMIODARONE, COLCHICINE,  CRESTOR, KLOR-CON, MEVACOR, MICARDIS, NEURONTIN, and NORVASC.  No clear indications as to what these cause.  SOCIAL HISTORY:  The patient is married, lives with his family.  Denied tobacco, alcohol, or IV drug use.  FAMILY HISTORY:  Noncontributory.  REVIEW OF SYSTEMS:  All systems reviewed are currently negative except per HPI.  PHYSICAL EXAMINATION:  VITAL SIGNS:  Temperature 101.2 rectally, blood pressure initially 179/83 with a pulse of 74, respiratory rate 16, sats 100% on room air. GENERAL:  He is awake, alert, pleasant man.  He is in mild distress with every movement due to pain. HEENT:  PERL.  EOMI.  No pallor, no jaundice.  No rhinorrhea. NECK:  Supple.  No JVD, no lymphadenopathy, no thyromegaly. RESPIRATORY:  He has good air entry bilaterally.  No wheezes, no rales, no crackles. CARDIOVASCULAR:  He has S1 and S2.  No audible murmurs. ABDOMEN:  Soft, full, nontender with positive bowel sounds. EXTREMITIES:  Left lower extremity status post BKA.  On the right, he has no visible edema, cyanosis, or clubbing but he has no change in color on his calf, slightly warm at the calf and also tender. MUSCULOSKELETAL:  Again,  status post BKA on the left.  No visible joint swelling or tenderness on the right lower extremity as well as upper extremities. NEUROLOGIC:  Cranial nerves II through XII seems to be intact.  The patient has significant sensory loss in his right lower extremity.  He has paresthesias.  His right lower extremity is very tender to touch, even to the finest touch, and this extends all the way to the inguinal region.  LABORATORY DATA:  PT 14.8, INR 1.4, PTT of 35.  Sodium is 140, potassium 3.4, chloride 99, CO2 is 32, glucose 162, BUN 44, creatinine 1.86, and calcium 9.8.  His white count is 27.9 thousand with left shift, ANC of 25.4, hemoglobin 12.4, and platelets 412.  Urinalysis is negative.  BNP is 629.  His chest x-ray showed cardiomegaly without edema.   There is hyperexpansion and atelectasis. CT head without contrast showed no acute intracranial abnormality. Spinal x-ray showed no acute findings.  ASSESSMENT: 1. This is a 70 year old gentleman presenting with severe left lower     extremity pain, tenderness, sounds like radicular pain but also     spiking a fever here.  This is worrisome for possible deep tissue     infection.  We need to be on the lookout for possible necrotizing     fasciitis although there is no clinically evidence at this point.     He has an elevated white count but again the patient has     chronically elevated white counts, the lowest I have seen was     20,000.  This is coming from his polycythemia.  We will need to     keep an eye on him in case his white count continues to rise and he     continues to have symptoms, we may have to get a CT scan of his     right lower extremity. 2. Febrile illness.  Again, this could be early cellulitis, could be     gout.  There is no evidence on his chest x-ray or urinalysis for     the source of infection.  We will keep an eye to see if anything     changes.  In the meantime, I will empirically start him on     vancomycin, Zosyn until we know what is happening. 3. Diabetes.  Continue sliding scale insulin while in the hospital and     also his 70/30 insulin. 4. Coronary artery disease.  The patient is stable.  We will cycle his     enzymes but put him on tele. 5. Peripheral vascular disease.  The patient has good DP pulses on the     right.  There is no significant color change, I doubt this is     vascular at this point. 6. Hypokalemia.  We will replete his potassium as much as possible. 7. Benign prostatic hypertrophy.  Continue with Flomax. 8. Hypertension.  Blood pressure is elevated now.  We will continue     with his home medications.     Lonia Blood, M.D.    Verlin Grills  D:  07/10/2010  T:  07/10/2010  Job:  [id number]  Electronically Signed by Lonia Blood M.D.  on 07/30/2010 04:07:17 PM

## 2010-08-06 NOTE — Consult Note (Signed)
NAME:  Donald Dudley, NOSBISCH NO.:  [id number]  MEDICAL RECORD NO.:  [id number]           PATIENT TYPE:  I  LOCATION:  6705                         FACILITY:  [facility]  PHYSICIAN:  Tyaisha Cullom P. Pearlean Brownie, MD    DATE OF BIRTH:  July 07, 1940  DATE OF CONSULTATION: DATE OF DISCHARGE:                                CONSULTATION   REFERRING PHYSICIAN:  [facility].  REASON FOR REFERRAL:  Right leg pain, numbness, and weakness.  HISTORY OF PRESENT ILLNESS:  Mr. Figiel is a 70 year old Caucasian gentleman who developed sudden onset of pain around his knee and cough as well as numbness and weakness a week ago.  His pain and numbness have persisted.  He has been treated for suspected cellulitis and infection with antibiotics, but all workup has been negative; and he has been also treated for gout as well as evaluated for peripheral vascular disease. All workup has been negative.  He has been treated with injectable pain medications as well as Lyrica 25 twice a day.  He states he has obtained only about 30% improvement after prednisone has been started.  Dilaudid injection provides him the best relief, and he is also on Vicodin which has not been quite effective as well.  He denies any back pain, radicular pain, or pain shooting down his legs, but describes pain mainly in his calf and leg and radiating up, actually even up to his lower abdomen to his ribcage.  He describes numbness from ribcage down only on the right side.  He denies similar episode in the past.  He does have history of chronic back pain and has been treated with epidural steroids in the past, but he states that pain was in the spine and not in the legs.  He has no prior history of stroke, TIA, seizures, but he does have mild peripheral neuropathy and describes paresthesias in his fingertips as well as legs.  PAST MEDICAL HISTORY:  Significant for chronic diabetes with diabetes cellulitis affecting left leg  requiring below-knee amputation.  History of peripheral vascular disease, atrial fibrillation, benign prostatic hypertrophy, congestive heart failure, polycythemia, hypertension, and renal artery stenosis, status post stents.  PAST SURGICAL HISTORY:  Below-knee amputation on the left, left knee replacement.  MEDICATIONS ALLERGIES:  COLCHICINE, AMIODARONE, POTASSIUM, CRESTOR, MEVACOR, NEURONTIN, MICARDIS, NORVASC.  SOCIAL HISTORY:  Married.  Lives with his family.  Does not smoke or drink currently.  FAMILY HISTORY:  Noncontributory.  REVIEW OF SYSTEMS:  As stated above in the history of present illness.  PHYSICAL EXAMINATION:  GENERAL:  A pleasant elderly Caucasian gentleman who is currently today not in distress. VITAL SIGNS:  Temperature 97.5, pulse rate 58 per minute and regular, blood pressure 194/78, respiratory rate 20 per minute, oxygen sats 97% on room air. HEENT:  Head is nontraumatic.  Hearing appears normal. NECK:  Supple.  There is no bruit. CARDIAC:  No murmur or gallop. LUNGS:  Clear to auscultation. EXTREMITIES:  He has left below-knee amputation.  There is trace pedal edema.  Good distal pulses in the right foot.  He has minimum tenderness to touch in the right thigh and  knee, but he can passively move without the patient being in severe pain. NEUROLOGIC:  He is awake, alert, and oriented to time, place, and person.  Eye movements are full range without nystagmus.  Visual acuity and fields seem adequate.  Face is symmetric.  Palatal movements normal. Tongue is midline.  Motor system exam reveals no upper or lower extremity drift.  He has no focal weakness in the upper extremities. There is mild weakness 4/5 on the right hip flexors, hip abductors. There is 4+/5 strength in the right knee and 5/5 strength in the right ankle.  He has no weakness in the left leg.  Deep tendon reflexes are brisk on the right.  Right plantar is equivocal.  He has  subjective decreased touch to pinprick sensation in the right abdomen from the __________ down involving the entire right lower extremity.  He does not split the midline.  Position vibration absent at the right ankle.  DATA REVIEWED:  Admission labs significant for elevated white count of 27.9 with leftward shift with all workup for infection as negative.  UA is negative.  Electrolytes significant only for creatinine of 1.86, BUN of 44, potassium of 3.4.  IMPRESSION:  A 70 year old gentleman with right lower extremity and lower abdomen pain, numbness, paresthesias, and weakness of sudden onset.  Possibilities include spinal cord lesion, ischemic versus compressive, rule this out by doing MRI scan.  Other possibilities include diabetic lumbar radiculitis versus degenerative compressive lumbar spine disease.  He does have mild underlying diabetic neuropathy, but the presentation is unusual for that.  PLAN:  I agree with continuing short course of steroids for now, increase Lyrica to 75 twice a day.  Check MRI scan of the thoracic and lumbar spine.  Dr. Thana Farr from [facility] will follow in the morning.  Kindly call for questions.   Jakwan Sally P. Pearlean Brownie, MD     PPS/MEDQ  D:  07/15/2010  T:  07/16/2010  Job:  [id number]  Electronically Signed by Delia Heady MD on 08/06/2010 11:25:25 AM

## 2010-08-09 ENCOUNTER — Inpatient Hospital Stay (HOSPITAL_COMMUNITY)
Admission: EM | Admit: 2010-08-09 | Discharge: 2010-08-12 | DRG: 554 | Disposition: A | Discharge status: Skilled Nursing Facility | Payer: Medicare Other | Attending: Internal Medicine | Admitting: Internal Medicine

## 2010-08-09 ENCOUNTER — Emergency Department (HOSPITAL_COMMUNITY): Payer: Medicare Other

## 2010-08-09 DIAGNOSIS — C9110 Chronic lymphocytic leukemia of B-cell type not having achieved remission: Secondary | ICD-10-CM | POA: Diagnosis present

## 2010-08-09 DIAGNOSIS — N179 Acute kidney failure, unspecified: Secondary | ICD-10-CM | POA: Diagnosis present

## 2010-08-09 DIAGNOSIS — I129 Hypertensive chronic kidney disease with stage 1 through stage 4 chronic kidney disease, or unspecified chronic kidney disease: Secondary | ICD-10-CM | POA: Diagnosis present

## 2010-08-09 DIAGNOSIS — N400 Enlarged prostate without lower urinary tract symptoms: Secondary | ICD-10-CM | POA: Diagnosis present

## 2010-08-09 DIAGNOSIS — IMO0002 Reserved for concepts with insufficient information to code with codable children: Secondary | ICD-10-CM

## 2010-08-09 DIAGNOSIS — Z8249 Family history of ischemic heart disease and other diseases of the circulatory system: Secondary | ICD-10-CM

## 2010-08-09 DIAGNOSIS — I2510 Atherosclerotic heart disease of native coronary artery without angina pectoris: Secondary | ICD-10-CM | POA: Diagnosis present

## 2010-08-09 DIAGNOSIS — M109 Gout, unspecified: Principal | ICD-10-CM | POA: Diagnosis present

## 2010-08-09 DIAGNOSIS — I4891 Unspecified atrial fibrillation: Secondary | ICD-10-CM | POA: Diagnosis present

## 2010-08-09 DIAGNOSIS — S88119A Complete traumatic amputation at level between knee and ankle, unspecified lower leg, initial encounter: Secondary | ICD-10-CM

## 2010-08-09 DIAGNOSIS — Z888 Allergy status to other drugs, medicaments and biological substances status: Secondary | ICD-10-CM

## 2010-08-09 DIAGNOSIS — Z794 Long term (current) use of insulin: Secondary | ICD-10-CM

## 2010-08-09 DIAGNOSIS — Z79899 Other long term (current) drug therapy: Secondary | ICD-10-CM

## 2010-08-09 DIAGNOSIS — Z96659 Presence of unspecified artificial knee joint: Secondary | ICD-10-CM

## 2010-08-09 DIAGNOSIS — E669 Obesity, unspecified: Secondary | ICD-10-CM | POA: Diagnosis present

## 2010-08-09 DIAGNOSIS — N183 Chronic kidney disease, stage 3 unspecified: Secondary | ICD-10-CM | POA: Diagnosis present

## 2010-08-09 DIAGNOSIS — E119 Type 2 diabetes mellitus without complications: Secondary | ICD-10-CM | POA: Diagnosis present

## 2010-08-09 DIAGNOSIS — Z833 Family history of diabetes mellitus: Secondary | ICD-10-CM

## 2010-08-09 LAB — CK TOTAL AND CKMB (NOT AT ARMC)
CK, MB: 3.6 ng/mL (ref 0.3–4.0)
Relative Index: INVALID (ref 0.0–2.5)
Total CK: 47 U/L (ref 7–232)

## 2010-08-09 LAB — URINALYSIS, ROUTINE W REFLEX MICROSCOPIC
Bilirubin Urine: NEGATIVE
Ketones, ur: NEGATIVE mg/dL
Nitrite: NEGATIVE
Specific Gravity, Urine: 1.015 (ref 1.005–1.030)
Urobilinogen, UA: 2 mg/dL — ABNORMAL HIGH (ref 0.0–1.0)
pH: 5.5 (ref 5.0–8.0)

## 2010-08-09 LAB — CBC
MCH: 19.2 pg — ABNORMAL LOW (ref 26.0–34.0)
MCHC: 27.5 g/dL — ABNORMAL LOW (ref 30.0–36.0)
MCV: 69.7 fL — ABNORMAL LOW (ref 78.0–100.0)
Platelets: 387 10*3/uL (ref 150–400)
RDW: 21.2 % — ABNORMAL HIGH (ref 11.5–15.5)

## 2010-08-09 LAB — SEDIMENTATION RATE: Sed Rate: 5 mm/hr (ref 0–16)

## 2010-08-09 LAB — COMPREHENSIVE METABOLIC PANEL
AST: 21 U/L (ref 0–37)
CO2: 29 mEq/L (ref 19–32)
Calcium: 8.7 mg/dL (ref 8.4–10.5)
Chloride: 106 mEq/L (ref 96–112)
Creatinine, Ser: 1.68 mg/dL — ABNORMAL HIGH (ref 0.4–1.5)
GFR calc Af Amer: 49 mL/min — ABNORMAL LOW (ref >60)
GFR calc non Af Amer: 40 mL/min — ABNORMAL LOW (ref >60)
Glucose, Bld: 92 mg/dL (ref 70–99)
Total Bilirubin: 1.8 mg/dL — ABNORMAL HIGH (ref 0.3–1.2)

## 2010-08-09 LAB — DIFFERENTIAL
Basophils Absolute: 0.2 10*3/uL — ABNORMAL HIGH (ref 0.0–0.1)
Eosinophils Absolute: 0.4 10*3/uL (ref 0.0–0.7)
Lymphs Abs: 0.6 10*3/uL — ABNORMAL LOW (ref 0.7–4.0)
Monocytes Absolute: 2.1 10*3/uL — ABNORMAL HIGH (ref 0.1–1.0)
Monocytes Relative: 10 % (ref 3–12)
Neutro Abs: 17.5 10*3/uL — ABNORMAL HIGH (ref 1.7–7.7)

## 2010-08-09 LAB — GLUCOSE, CAPILLARY: Glucose-Capillary: 128 mg/dL — ABNORMAL HIGH (ref 70–99)

## 2010-08-09 LAB — PROTIME-INR: Prothrombin Time: 15.3 seconds — ABNORMAL HIGH (ref 11.6–15.2)

## 2010-08-09 LAB — APTT: aPTT: 37 seconds (ref 24–37)

## 2010-08-10 LAB — CK TOTAL AND CKMB (NOT AT ARMC)
CK, MB: 2.2 ng/mL (ref 0.3–4.0)
CK, MB: 2.5 ng/mL (ref 0.3–4.0)
Relative Index: INVALID (ref 0.0–2.5)
Relative Index: INVALID (ref 0.0–2.5)
Total CK: 41 U/L (ref 7–232)
Total CK: 67 U/L (ref 7–232)

## 2010-08-10 LAB — BASIC METABOLIC PANEL
Calcium: 8.4 mg/dL (ref 8.4–10.5)
Creatinine, Ser: 1.87 mg/dL — ABNORMAL HIGH (ref 0.4–1.5)
GFR calc Af Amer: 43 mL/min — ABNORMAL LOW (ref >60)
GFR calc non Af Amer: 36 mL/min — ABNORMAL LOW (ref >60)
Sodium: 142 mEq/L (ref 135–145)

## 2010-08-10 LAB — GLUCOSE, CAPILLARY
Glucose-Capillary: 89 mg/dL (ref 70–99)
Glucose-Capillary: 128 mg/dL — ABNORMAL HIGH (ref 70–99)

## 2010-08-10 LAB — URIC ACID: Uric Acid, Serum: 8.2 mg/dL — ABNORMAL HIGH (ref 4.0–7.8)

## 2010-08-11 ENCOUNTER — Inpatient Hospital Stay (HOSPITAL_COMMUNITY): Payer: Medicare Other

## 2010-08-11 LAB — GLUCOSE, CAPILLARY
Glucose-Capillary: 150 mg/dL — ABNORMAL HIGH (ref 70–99)
Glucose-Capillary: 253 mg/dL — ABNORMAL HIGH (ref 70–99)
Glucose-Capillary: 312 mg/dL — ABNORMAL HIGH (ref 70–99)

## 2010-08-11 LAB — URINE CULTURE
Colony Count: 1000
Culture  Setup Time: 201204281135

## 2010-08-11 LAB — BASIC METABOLIC PANEL
CO2: 29 mEq/L (ref 19–32)
Chloride: 100 mEq/L (ref 96–112)
Creatinine, Ser: 2 mg/dL — ABNORMAL HIGH (ref 0.4–1.5)
GFR calc Af Amer: 40 mL/min — ABNORMAL LOW (ref >60)
Potassium: 2.9 mEq/L — ABNORMAL LOW (ref 3.5–5.1)

## 2010-08-11 LAB — CBC
HCT: 40.4 % (ref 39.0–52.0)
MCH: 18.9 pg — ABNORMAL LOW (ref 26.0–34.0)
MCV: 68.9 fL — ABNORMAL LOW (ref 78.0–100.0)
Platelets: 348 10*3/uL (ref 150–400)
RBC: 5.86 MIL/uL — ABNORMAL HIGH (ref 4.22–5.81)
RDW: 20.7 % — ABNORMAL HIGH (ref 11.5–15.5)
WBC: 24.1 10*3/uL — ABNORMAL HIGH (ref 4.0–10.5)

## 2010-08-12 LAB — BASIC METABOLIC PANEL
BUN: 35 mg/dL — ABNORMAL HIGH (ref 6–23)
Chloride: 99 mEq/L (ref 96–112)
GFR calc non Af Amer: 40 mL/min — ABNORMAL LOW (ref >60)
Glucose, Bld: 254 mg/dL — ABNORMAL HIGH (ref 70–99)
Potassium: 3.2 mEq/L — ABNORMAL LOW (ref 3.5–5.1)
Sodium: 139 mEq/L (ref 135–145)

## 2010-08-12 LAB — GLUCOSE, CAPILLARY
Glucose-Capillary: 291 mg/dL — ABNORMAL HIGH (ref 70–99)
Glucose-Capillary: 270 mg/dL — ABNORMAL HIGH (ref 70–99)

## 2010-08-12 LAB — URINE CULTURE
Colony Count: NO GROWTH
Culture: NO GROWTH

## 2010-08-12 LAB — MAGNESIUM: Magnesium: 2.4 mg/dL (ref 1.5–2.5)

## 2010-08-13 NOTE — Discharge Summary (Signed)
NAME:  Donald Dudley, Donald Dudley NO.:  [id number]  MEDICAL RECORD NO.:  [id number]           PATIENT TYPE:  I  LOCATION:  6738                         FACILITY:  [facility]  PHYSICIAN:  Marinda Elk, M.D.DATE OF BIRTH:  09/11/1940  DATE OF ADMISSION:  08/09/2010 DATE OF DISCHARGE:                        DISCHARGE SUMMARY - REFERRING   PRIMARY CARE DOCTOR:  Antony Madura, MD  HEMATOLOGIST:  Ladene Artist, MD  CARDIOLOGIST:  [name], MD  NEPHROLOGIST:  Terrial Rhodes, MD  DISCHARGE DIAGNOSES: 1. Acute gout flare with multiple joint involvements. 2. Leukocytosis, probably secondary to chronic lymphocytic leukemia. 3. Hypertension. 4. Diabetes type 2. 5. Acute kidney injury.  DISCHARGE MEDICATIONS:  Please refer to dictation medication list for further details.  This is a 70 year old gentleman with past medical history of CLL, chronic kidney disease stage III, coronary artery disease, hypertension, diabetes, left below-the-knee amputation, also chronic gout, who comes to the emergency room complaining of swelling of her lower extremities and joint as well and as well as hand bilaterally and joint swelling bilaterally.  The patient denies any fever, nausea, or vomiting.  Please relate to dictation from August 09, 2010, by Marthann Schiller for details.  ASSESSMENT AND PLAN: 1. Acute gout flare involving multiple joints.  His knee was very     swollen.  Arthrocentesis was done.  His uric acid was 8.2.  His ESR     was 5.  The patient does have a history of gout.  One paracentesis     was done.  There was no pus or it was just clear.  He was started     on steroids.  His joint involvement improved.  He was able to     ambulate with physical therapy.  They recommended that he has been     going on for a week, so he will benefit probably from temporary     skilled nursing facility.  The patient agreed and he was discharged     on steroid taper.  He  was started on Uloric as he does not tolerate     colchicine.  He will follow up with his primary care doctor as an     outpatient. 2. Chronic lymphocytic leukemia.  On admission, his white count was     20, is much improved from before which was when he was in the     hospital was around 30.  Overnight, he was started on Levaquin.     This was stopped.  He has developed no fevers. 3. Hypertension, currently well controlled.  No changes were made. 4. Diabetes type 2.  His glucose has been high.  It is probably     secondary to the steroids.  He was titrated up.  He will go back to     a nursing home with insulin 70/30.  He will only go on steroids for     less than a week and this should be corrected. 5. Acute kidney injury.  It is probably secondary to over diuresis.     His diuretics were stopped.  He was given about a liter  of IV     fluids, and started to trend down.  He seems much improved.  He is     able to walk without any difficulties.  His creatinine has come     down from a peak of 2-1.7, his current baseline is 1.6.  So, we     will resume diuretics in 1-2 days.  On the day of discharge, his temperature is 97, pulse is 58, respirations 18, blood pressure 114/68, he was an 93% on room air.  Labs on the day of discharge showed a mag of 2.4, sodium 139, potassium 3.2 which was repleted, chloride 89, bicarb of 30, glucose of 254, BUN of 35, his creatinine of 1.7, calcium of 8.6.     Marinda Elk, M.D.     AF/MEDQ  D:  08/12/2010  T:  08/12/2010  Job:  [id number]  Electronically Signed by Marinda Elk M.D. on 08/13/2010 12:13:19 PM

## 2010-08-13 NOTE — Consult Note (Signed)
  NAME:  Donald Dudley, RAUCCI NO.:  [id number]  MEDICAL RECORD NO.:  [id number]           PATIENT TYPE:  I  LOCATION:  6738                         FACILITY:  [facility]  PHYSICIAN:  Nadara Mustard, MD     DATE OF BIRTH:  Nov 28, 1940  DATE OF CONSULTATION:  08/10/2010 DATE OF DISCHARGE:                                CONSULTATION   HISTORY OF PRESENT ILLNESS:  The patient is a 70 year old gentleman who has had multiple admissions due to painful, swollen red joints.  He was initially admitted on July 10, 2010, with painful swelling of the right knee.  The patient also complains of swelling in the joints of his hands.  The patient underwent treatment for possible infection, was placed on 2 weeks of antibiotics, was discharged to skilled nursing.  He was in skilled nursing for about 2 weeks, and after being discharged to home, the patient immediately returned back to emergency room complaining of persistent pain and swelling in the right knee.  The patient was admitted, was immediately started on Levaquin at 5 o'clock this morning.  I am seeing him in consultation at approximately 1 o'clock in the afternoon.  PAST MEDICAL HISTORY:  Significant for hypertension, peripheral arterial disease, status post amputation and revision amputation for left transtibial amputation, type 2 diabetes, chronic lymphocytic leukemia, chronic kidney disease, end-stage renal disease, coronary artery disease, congestive heart failure, polycythemia.  SOCIAL HISTORY:  The patient lives on his own.  MEDICATIONS:  Include 1. Lyrica. 2. Celexa. 3. Allopurinol 100 mg daily. 4. Hydrea. 5. Ambien. 6. Atenolol. 7. Colace. 8. Hydralazine. 9. Insulin. 10.Torsemide. 11.Isosorbide mononitrate. 12.Vicodin. 13.Potassium. 14.Flomax.  The patient has multiple other drug allergies.  The patient was seen on his left examination by Dr. Ollen Gross and Dr. Melvyn Novas, Orthopedics, for swelling of his  fingers.  OBJECTIVE EXAMINATION:  The patient has multiple swelling of the IP joints in both hands with redness and swelling.  The patient has redness and swelling of the right knee.  He states it has been this way for over a month.  He has a stable left transtibial amputation.  Examination of the right knee, there is no cellulitis.  There is no induration to the skin, but there is warmth and redness.  After informed consent and sterile prepping, the right knee was aspirated from the superolateral portal, 10 mL of clear synovial fluid was aspirated.  This was sent for crystals as well as cell count and cultures.  I doubt the cultures will be beneficial due to his recently restarting antibiotics.  ASSESSMENT:  Chronic gout, right knee.  PLAN:  The patient was started on Colcrys 0.6 mg p.o. b.i.d.  His labs are pending.  There is no purulence within the joint.  No signs of a septic joint.  The patient has had joint pain and swelling for over a month.     Nadara Mustard, MD     MVD/MEDQ  D:  08/10/2010  T:  08/11/2010  Job:  [id number]  Electronically Signed by Aldean Baker MD on 08/13/2010 04:29:44 PM

## 2010-08-14 LAB — BODY FLUID CULTURE: Culture: NO GROWTH

## 2010-08-16 LAB — CULTURE, BLOOD (ROUTINE X 2)
Culture  Setup Time: 201204281134
Culture  Setup Time: 201204281134
Culture: NO GROWTH

## 2010-08-21 NOTE — H&P (Signed)
NAME:  Donald Dudley, Donald Dudley NO.:  [id number]  MEDICAL RECORD NO.:  [id number]           PATIENT TYPE:  E  LOCATION:  [facility]                         FACILITY:  [facility]  PHYSICIAN:  Altha Harm, MDDATE OF BIRTH:  1941-02-18  DATE OF ADMISSION:  08/09/2010 DATE OF DISCHARGE:                             HISTORY & PHYSICAL   CHIEF COMPLAINT:  Swelling of bilateral lower extremities and bilateral upper extremities with joint pain.  HISTORY OF PRESENT ILLNESS:  Mr. Bachmeier is a 70 year old gentleman with a history of chronic lymphocytic leukemia, chronic kidney disease stage III, coronary artery disease, hypertension, diabetes, left below- knee amputation, who presents to the emergency room with complaints of swelling in the bilateral lower extremities with joint pains as well as the bilateral hands with pains in the joints of the hands.  The patient denies any fever or chills.  He denies any nausea, vomiting, or diarrhea.  The patient does not know his dry weight.  He is unclear as to whether or not he has had any weight gain recently.  He denies any PND, orthopnea, or shortness of breath.  Please note, however, the patient's O2 saturations are down to about 90% without oxygen.  He states that he had been in physical therapy and the therapist came over to see him today and noticed that he had swelling in the lower extremities and advised him to come to the emergency room immediately. We were asked to see the patient for further evaluation and management.  PAST MEDICAL HISTORY:  Significant for, 1. Hypertension. 2. Peripheral arterial disease. 3. Diabetes type 2. 4. Chronic lymphocytic leukemia. 5. Chronic kidney disease stage III. 6. Coronary artery disease. 7. History of congestive heart failure, type unspecified. 8. Atrial fibrillation, longstanding not a candidate for Coumadin by     the patient's admission and by prior records. 9. Benign prostatic  hypertrophy. 10.Bilateral renal artery stenosis status post stents. 11.Polycythemia. 12.Left total knee arthroplasty.  FAMILY HISTORY:  Significant for coronary artery disease in mother, father, and siblings and diabetes and hypertension in the daughter.  SOCIAL HISTORY:  The patient resides by himself.  There is no tobacco, alcohol, or drug use.  CURRENT MEDICATIONS:  Not yet available.  Pharmacy is to obtain the list; however, the patient does admit that he thinks he has had some changes to his medications since being in the nursing facility. Actually his medications are not available and they are as follows, 1. Lyrica 75 mg p.o. t.i.d. 2. Celexa 20 mg p.o. daily. 3. Allopurinol 100 mg p.o. daily. 4. Hydrea 500 mg p.o. Monday, Wednesday, and Friday. 5. Ambien 10 mg p.o. at bedtime p.r.n., insomnia. 6. Atenolol 100 mg p.o. daily. 7. Colace over-the-counter 1 capsule at bedtime p.r.n. 8. Hydralazine 30 mg p.o. t.i.d. 9. Insulin 70/30.  The patient does not have any particular regimen. 10.Torsemide 20 mg p.o. daily. 11.Isosorbide mononitrate 30 mg p.o. q.p.m. 12.Hydrocodone/APAP 10/500 one tablet p.o. q.4 h p.r.n. 13.Potassium chloride 20 mEq p.o. daily. 14.Flomax 0.4 mg p.o. q.a.m.  ALLERGIES:  MULTIPLE TO THE FOLLOWING STATINS. 1. COLCHICINE. 2. AMIODARONE. 3. AMLODIPINE. 4. GABAPENTIN. 5. MICARDIS.  6. CRESTOR. 7. MEVACOR.  PRIMARY CARE PHYSICIAN:  Antony Madura, MD.  ONCOLOGIST:  Ladene Artist, MD.  CARDIOLOGIST:  [name], MD.  NEPHROLOGIST:  Terrial Rhodes, MD  REVIEW OF SYSTEMS:  All other systems negative.  LABORATORY DATA:  Studies in the emergency room showed the following. White blood cell count of 20.8.  Please note that the last white blood cell count here by this patient was 38.8, which is likely related to his chronic lymphocytic leukemia.  Hemoglobin 12.2, hematocrit 44.3, platelet count 387.  Urinalysis is negative for any elements  consistent with urinary tract infection.  Sodium of 146, potassium 3.6, chloride 106, bicarb 29, BUN 31, creatinine 1.68, alkaline phosphatase is mildly elevated at 225 and a bilirubin at 1.8.  Fractionation not done in the emergency room.  PHYSICAL EXAMINATION:  GENERAL:  The patient is resting comfortably in bed.  He is in no acute distress. VITAL SIGNS:  Temperature is 98.7, heart rate 150/56, respiratory rate 18, O2 sats are 94% on room air.  Please note at the time of my evaluation, the patient sats were 90% on room air. HEENT:  He is normocephalic, atraumatic.  Pupils are equally round and reactive to light and accommodation.  Extraocular movements are intact. Oropharynx is moist.  No exudate, erythema, or lesions noted. NECK:  Trachea is midline.  No masses, no thyromegaly, no JVD, no carotid bruit. RESPIRATORY:  The patient has a normal respiratory effort, equal excursion bilaterally.  No wheezing or rhonchi noted. CARDIOVASCULAR:  He has got a normal S1 and S2.  No murmurs, rubs, or gallops are noted.  PMI is nondisplaced.  No heaves or thrills on palpation. ABDOMEN:  Obese, soft, nontender, and nondistended.  No masses.  No hepatosplenomegaly. LYMPH NODE:  There is no cervical, axillary, or inguinal lymphadenopathy noted. MUSCULOSKELETAL:  The patient has swelling of bilateral lower extremities.  He also has swelling of the bilateral upper extremities right around the joints.  There is no spinal tenderness noted. NEUROLOGICAL:  No focal neurological deficits noted.  Cranial nerves II through XII are grossly intact.  DTRs 2+ bilaterally in the upper and lower extremities. PSYCHIATRIC:  He is alert and oriented x3.  Good insight and cognition. Good recent and remote recall.  ASSESSMENT/PLAN:  This is a patient who presents with anasarca and polyarthralgia.  I question whether or not the patient's hydralazine may be contributing to his presentation.  This patient does have  extensive cardiac history; however, this cannot be ignored in light of the patient's presentation.  I will go ahead and dispense with getting the cardiac workup out of the phase with serial enzyme checks and check BNP. Also check 2-D echocardiogram.  However, I am going to hold the hydralazine at this time of the patient and substitute another agent to help with blood pressure controls.  I suspect that this may be implicating the patient's presentation.  We will check daily weights on the patient and monitor the patient's condition and progress based on the initial therapy.  I will go ahead and add some Lasix to the patient's regimen to help with some of the swelling and edema in the lower extremities as this appears to be rather uncomfortable for the patient.  In terms of his diabetes, the patient appears to be poorly educated on how he should be taking his medications.  We will go ahead and check hemoglobin A1c.  For right now, the patient will be treated with Lantus and  NovoLog on a bolus basis and we will make further assessments as to what dose of medications the patient should be taking on a chronic basis after initial titration.  In terms of his arthralgias, we will go ahead and treat that with narcotics with oxycodone and morphine sulfate.  At this time, I will stay away from any nonnarcotic analgesics in light of the fact that the patient does have known chronic kidney disease.  In terms of his leukocytosis, the patient shows no signs of infection.  I am not concerned about this as the patient's white blood cell count has actually improved over the last one, I suspect this is related to his chronic lymphocytic leukemia.  If of course, the patient shows any other signs of infection, then we will proceed accordingly.  The patient did not have a chest x-ray and we will get that here.  We will check a BNP, TSH, and I will hold the hydralazine on this patient.  The patient will be  given DVT prophylaxis with Lovenox and will be admitted to telemetry bed to [facility] team 4.  Total time for this including face-to-face time one hour.     Altha Harm, MD     MAM/MEDQ  D:  08/09/2010  T:  08/09/2010  Job:  [id number]  cc:   Antony Madura, M.D. Ladene Artist, M.D. [name], M.D. Terrial Rhodes, M.D.  Electronically Signed by Marthann Schiller MD on 08/21/2010 02:14:15 PM

## 2010-08-27 NOTE — H&P (Signed)
NAME:  Donald, Dudley NO.:  [id number]   MEDICAL RECORD NO.:  [id number]            PATIENT TYPE:   LOCATION:                                 FACILITY:   PHYSICIAN:  [name], M.D.      DATE OF BIRTH:   DATE OF ADMISSION:  DATE OF DISCHARGE:                              HISTORY & PHYSICAL   Donald Dudley is a 70 year old gentleman with a history of coronary  artery disease, coronary artery bypass grafting, peripheral vascular  disease, hypercholesterolemia and hypertension.  He was admitted for  heart catheterization after having recurrent episodes of chest pain.   The patient has a known history of coronary artery disease.  His native  right coronary artery and the saphenous vein graft to the right coronary  artery are both occluded and his distal right coronary artery fills via  collateral vessels.  He has a patent LIMA.  He has an occluded  second  diagonal vessel.  Over the past several days, Donald Dudley has had increasing  episodes of chest discomfort and chest pressure.  He describes it as a  5/10.  He took some tramadol with only partial relief.  We gave him a  nitroglycerin here in the office and he had fairly good relief.  We have  tried him on Ranexa in the past and he did not tolerate it for assorted  reasons.   CURRENT MEDICATIONS:  1. Atenolol 100 mg p.o. b.i.d.  2. Maxzide 75 mg/50 mg once a day.  3. Torsemide 20 mg twice a day.  4. Gemfibrozil 300 mg p.o. b.i.d.  5. Hydroxyurea 500 mg 3 times a week.  6. Insulin 70/30, 50 units in the morning with 40 units in the      evening.   He is intolerant to statins.  He is also intolerant to lisinopril,  amlodipine and Ranexa.   PAST MEDICAL HISTORY:  1. History of coronary artery disease.  He is status post coronary      artery bypass grafting with LIMA to the LAD and saphenous vein      graft to the right coronary artery.  2. Diabetes mellitus.  3. Hyperlipidemia.  4. Hypertension.   SOCIAL HISTORY:  The patient quit smoking in 1978.  He does not drink  alcohol to excess.   FAMILY HISTORY:  Unremarkable.   REVIEW OF SYSTEMS:  Negative except as noted in the HPI.   PHYSICAL EXAMINATION:  GENERAL:  He is an elderly gentleman in no acute  distress.  He is alert and oriented x 3 and his mood and affect are  normal.  VITAL SIGNS:  His weight is 200 pounds, blood pressure is 130/70 with  heart rate of 54.  HEENT:  Exam reveals 2+ carotids.  He has no bruits, no JVD, no  thyromegaly.  LUNGS:  Clear to auscultation.  HEART:  Regular rate, S1 and S2.  ABDOMEN:  Reveals good bowel sounds and is nontender.  EXTREMITIES:  He has no clubbing, cyanosis or edema.  NEUROLOGIC:  Exam is nonfocal.   His EKG reveals sinus bradycardia.  He has nonspecific ST/T wave  abnormalities.   Donald Dudley presents with some episodes of chest pain.  It was relieved with  sublingual nitroglycerin.  I have encouraged him to take nitroglycerin  and in fact I will start him on Imdur 30 mg a day.  I have asked him to  call if he has any worsening chest pains.  We will set him up for a cath  next week.  We will draw pre-cath labs as well as a troponin and CPK-MB.  We have discussed the risks, benefits and options of heart  catheterization.  He understands and agrees to proceed.           ______________________________  [name], M.D.     PJN/MEDQ  D:  03/19/2007  T:  03/19/2007  Job:  [id number]   cc:   Antony Madura, M.D.

## 2010-08-27 NOTE — Assessment & Plan Note (Signed)
OFFICE VISIT   Donald Dudley, Donald Dudley  DOB:  01-21-1941                                       01/18/2008  ZOXWR#:[id number]   The patient returns today for reevaluation of his renal artery blood  flow.  This gentleman with chronic renal insufficiency with a creatinine  of approximately 2.2 has been followed by [place] for several years.  He had  a left renal PTA and stent 2002 and then a right renal PTA and stent by  me in 2007.  Most recently in April of 2009 he did not show any evidence  of severe recurrent stenosis in either stent.  However, a test two weeks  ago reveals that the proximal right renal artery now appears to have a  recurrent severe stenosis with the left renal artery being widely  patent.  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   PHYSICAL EXAMINATION:  Vital signs:  On examination today blood pressure  152/67, heart rate is 52, respirations 14.  Carotid pulses are 3+ with a  harsh bruit on the left.  No bruit on the right.  Neurological:  Normal.  Chest:  Clear to auscultation.  Abdomen:  Soft, nontender with no masses  or bruits heard, 3+ femoral pulses palpable bilaterally.   Carotid duplex exam today was performed because of the bruit and the  fact that he has not had a study in about 12 years.  He has had a  previous right carotid endarterectomy.  The left internal carotid has a  mild to moderate stenosis in the 50% range and the left external carotid  has a severe stenosis.  He has no neurologic symptoms.   We will arrange for him to have angiography and probable angioplasty  within the right renal artery stent to be performed by Dr. Myra Gianotti on  Thursday, October 15.  He will take Mucomyst beginning the day prior and  have a bicarb drip the day of the procedure.  The risks and benefits  were thoroughly discussed with the patient who would like to proceed.   Quita Skye Hart Rochester, M.D.  Electronically Signed   JDL/MEDQ  D:  01/18/2008  T:  01/19/2008  Job:  [id number]   cc:   Terrial Rhodes, M.D.

## 2010-08-27 NOTE — Assessment & Plan Note (Signed)
OFFICE VISIT   Donald Dudley, Donald Dudley  DOB:  03/11/1941                                       09/26/2009  ZOXWR#:[id number]   CHIEF COMPLAINT:  Ulcer left foot.   HISTORY OF PRESENT ILLNESS:  Guarded but states that he does have a  history of diabetic neuropathy.  He has numbness in both feet.  He  denies a history of fever or chills.  He has had intermittent clearish  yellow drainage from the ulcer.  He denies any prior claudication  symptoms although he has been seen by one of my partners, Dr. Hart Rochester,  for peripheral arterial disease in the past.   Chronic medical problems include diabetes, hypertension, coronary artery  disease, congestive failure and polycythemia, all of which are currently  controlled and followed by Dr. Su Hilt.   Family History: brother who had coronary artery disease at a young age.   Full 12 point review of systems was performed on the patient today.  Please see intake referral form for details regarding this.   Several minutes were spent today reviewing his chart from previous  vascular procedures.  Dr. Hart Rochester has previously done bilateral renal  artery stents on him.  He has also previously had a right popliteal  angioplasty.  His arteriogram from 2005 was reviewed which showed a 50%  right common iliac artery stenosis and a 40%-50% left common femoral  stenosis, two vessel runoff via the anterior tibial and posterior tibial  arteries on the left.   MEDICATIONS:  1. Include atenolol 100 mg twice a day.  2. Maxzide 75/50 once daily.  3. Torsemide 20 mg once a day.  4. Hydroxyurea 500 mg once a day.  5. Insulin 70/30.  6. Isosorbide 30 mg.  7. Flomax 0.4 mg.  8. Vicodin.  9. Zolpidem.   ALLERGIES:  He has allergies listed to statins and side effects from  prednisone of increasing his glucose.   PHYSICAL EXAM:  Vital signs:  Blood pressure is 174/75 in the left arm,  heart rate 61 and regular.  Temperature is 98.3.   HEENT:  Unremarkable.  Neck:  Has 2+ carotid pulses.  There is a loud left carotid bruit.  Chest:  Clear to auscultation.  Cardiac:  Exam is regular rate and  rhythm without murmur.  Abdomen:  Soft, nontender, nondistended.  No  masses.  Extremities:  He has 2+ radial, 2+ femoral pulses bilaterally.  He has a 2+ right popliteal pulse.  He has 2+ dorsalis pedis and  posterior tibial pulse on the right.  He has absent popliteal and pedal  pulses on the left.  He has a 2 cm ulceration on the medial aspect of  the first metatarsal head left foot.  This has a pale base with some  clear yellowish drainage.  There is no surrounding erythema.  The left  foot is slightly edematous compared to the right.  Skin:  Has no other  obvious ulcers or rashes.  Neurologic:  Exam shows decreased sensation  in the feet bilaterally.  Upper extremity and lower extremity motor  strength is 5/5 and symmetric.  Musculoskeletal:  Exam shows no obvious  major joint deformities.  He does have a dressing on the right second  finger from his recent tenosynovitis.  Abdomen:  Soft, nontender,  nondistended.  No masses.   He  had bilateral ABIs performed today which were 1.11 on the right, 1.08  on the left.  However, the toe pressure on the left was 60 mmHg and the  waveforms in the femoral, popliteal and tibial vessels were all  monophasic suggesting that he may have some obstruction but calcified  vessels.   In summary, the patient has a nonhealing wound on his left foot.  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  His ABIs  although in the normal range were monophasic in character suggesting  obstruction and his previous arteriogram several years ago showed that  he did have lower extremity occlusive disease on the left side.  I  believe the best option for him at this point would be arteriogram and  lower extremity runoff to further define the left lower extremity,  possible angioplasty or  stent or possible bypass to try to heal up the  left foot wound.  He does have a history of renal insufficiency with  creatinine in the 2 range.  I did discuss with him today that we would  try to use carbon dioxide for most of the arteriogram but that some  contrast might need to be used and this could injure his kidneys  potentially putting him at risk for dialysis.  He understands and agrees  to proceed.  He would also like to have a Foley catheter placed prior to  the procedure as he has problems with urinating post procedure and inter  procedure.  He also would like to have a closure device placed in the  femoral artery and we will try honor all of these requests at the time  of this procedure.  Will have him placed with Foley catheter in short  stay prior to his procedure.  His arteriogram is scheduled for Friday  10/05/2009.  Risks, benefits, possible complications in addition to  contrast nephropathy were explained to the patient today and he  understands and agrees to proceed.  Also add on I ordered, reviewed and  interpreted his noninvasive vascular lab study today.     [name], MD  Electronically Signed   CEF/MEDQ  D:  09/26/2009  Dudley:  09/27/2009  Job:  [id number]   cc:   Antony Madura, M.D.

## 2010-08-27 NOTE — Assessment & Plan Note (Signed)
OFFICE VISIT   [name], Donald Dudley  DOB:  June 22, 1940                                       01/24/2010  EAVWU#:[id number]   The patient returns for followup today.  He underwent a left below knee  amputation revision in August of 2011.  I am pleased to report on exam  today that the below knee amputation is completely healed at this point.  He still complains of some swelling in his right foot.  His most recent  serum creatinine was 1.6.  I believe most likely the swelling in the  right leg is dependent in nature.   He had a right leg ABI today which showed the ABI was 0.96 compared to  1.10 on previous exam several months ago.   He was given a prescription for a shrinker for his left leg today.  He  will follow up with [employer] regarding prosthetic fitting and followup.  He will follow up with me on an as-needed basis.     [name], MD  Electronically Signed   CEF/MEDQ  D:  01/24/2010  T:  01/25/2010  Job:  [id number]   cc:   Terrial Rhodes, M.D.  [name], M.D.

## 2010-08-27 NOTE — Assessment & Plan Note (Signed)
OFFICE VISIT   ESGAR, BARNICK  DOB:  11-26-1940                                       07/20/2007  ZOXWR#:[id number]   The patient returns for followup regarding his renal PTA and stenting of  his right renal artery, which I performed December 2007 for some mild  renal insufficiency and uncontrolled hypertension.  Since that time, his  hypertension has been better controlled, his renal function has been  relatively stable.  His creatinine was 2.1 at that time, is now 2.28  with a BUN of 52.  He was recently hospitalized for hypokalemia by Dr.  Elease Hashimoto.  Currently, the patient complains of swelling in both lower  extremities, as well as dyspnea on exertion.  He does have bilateral  claudication symptoms, but is limited more by his dyspnea than his legs.   EXAM:  Blood pressure 134/66, heart rate 67, respirations 16.  His  carotid pulses are 3+ with no bruits.  Neurologic exam is normal.  Abdomen is soft and nontender with no palpable masses.  No bruits  audible.  He has 2+ femoral pulses bilaterally with no distal pulses  palpable.  Both feet are adequately perfused with 1 to 2+ edema.  Renal  scan today revealed no evidence of severe recurrent stenosis in the  right renal artery stent with velocities no higher than 138 cm per  second.  The left renal artery also appears widely patent.  He does have  some SMA and common iliac disease greater than 50%, which has been noted  in the past.   In general, I think he is quite stable and will see him back in 1 year  with a followup renal scan and ABI at that time.   Quita Skye Hart Rochester, M.D.  Electronically Signed   JDL/MEDQ  D:  07/20/2007  T:  07/21/2007  Job:  [id number]   cc:   [name], M.D.

## 2010-08-27 NOTE — Discharge Summary (Signed)
NAME:  Donald Dudley, Donald Dudley NO.:  [id number]   MEDICAL RECORD NO.:  [id number]          PATIENT TYPE:  INP   LOCATION:  3705                         FACILITY:  [facility]   PHYSICIAN:  [name], M.D. DATE OF BIRTH:  [birth year]   DATE OF ADMISSION:  06/29/2007  DATE OF DISCHARGE:  07/02/2007                               DISCHARGE SUMMARY   DISCHARGE DIAGNOSES:  1. Chest and back pain.  2. Profound electrolyte disturbances.  3. Diffuse coronary artery disease.  4. Chronic renal insufficiency.  5. Diabetes mellitus.  6. Dyslipidemia.  7. Atrial fibrillation/atrial flutter.  8. Pulmonary hypertension.  9. Elevated white blood cell count.  10.Peripheral vascular disease.   DISCHARGE MEDICATIONS:  1. Carvedilol 25 mg p.o. b.i.d.  2. Maxzide 37.5/25 mg once day.  3. NovoLog 70/30 fifty-two units twice day or as otherwise directed by      Dr. Su Hilt.  4. Imdur 30 mg a day.  5. Ultram 50 mg 3 times a day as needed.  6. Folic acid once a day.  7. Multivitamin once a day.  8. Potassium chloride 20 mEq a day.   DISPOSITION:  The patient will see Dr. Elease Hashimoto in the office on Tuesday  for an office visit and a basic metabolic profile.   HISTORY:  Donald Dudley is a 70 year old gentleman with multiple  medical problems as noted above.  He was admitted through the emergency  room with diffuse chest and back pain as well as profound fatigue.  Please see dictated H&P for further information.   HOSPITAL COURSE BY PROBLEMS:  1. Chest pain.  The patient presented with rather atypical chest pain.      He essentially ruled out for myocardial infarction.  He did have      some mildly evaluated troponin levels, but there was no pattern      consistent with myocardial infarction.  His symptoms resolved      completely, as we gave him normal saline and potassium      supplementation and I suspect that these pains were just due to his      profound electrolyte disturbances.  2. Hyponatremia.  The patient was admitted with a sodium of 127.  We      gave him several liters of normal saline and held his Demadex.  We      also cut his Maxzide in half.  His sodium slowly came up and at      that time of discharge, it is still slightly low at 134.  He feels      quite a bit better after receiving normal saline and I think, he      was actually volume depleted.  All of his symptoms resolved after      repleting his fluid balance, his sodium, and potassium.  3. Hypokalemia. The patient was admitted with the potassium of 2.3.      He got as low as 2.1.  We gave him multiple p.o. doses of potassium      as well as some runs of potassium.  The IV potassium caused a  lot      of pain, so we eventually had to discontinue those.  The patient      will be discharged on 20 mEq of potassium each day and we will      check it again in 4 days.  He has a history of renal insufficiency,      so we will need to be careful with his potassium supplementation.  4. Chronic kidney disease.  The patient was admitted with a creatinine      of 2.3 with a BUN of 50.  This improved with hydration.  His      creatinine is now 1.8.  His BUN has improved, BUN is 36.  We will      continue with his current medications.  He does have some      underlying kidney disease, but this certainly was helped with      increased fluids.  5. Diabetes mellitus - stable.  All of his other medical problems were      relatively stable and he will be followed by Dr. Elease Hashimoto and Dr.      Su Hilt.           ______________________________  [name], M.D.     PJN/MEDQ  D:  07/02/2007  T:  07/02/2007  Job:  [id number]   cc:   Antony Madura, M.D.

## 2010-08-27 NOTE — Assessment & Plan Note (Signed)
OFFICE VISIT   Donald Dudley, Donald Dudley  DOB:  [birth year]                                       12/06/2009  EAVWU#:[id number]   The patient returns for followup today.  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  He required revision of this for poor healing  and a previous infection in August 2011.  He presents today for further  followup.  He was discharged to home with a VAC dressing, and this was  discontinued yesterday as the wound has continued to shrink.   PHYSICAL EXAMINATION:  Today, blood pressure is 148/61 in the right arm,  heart rate 61 and regular.  Temperature is 98.4.  Left below knee  amputation still has some edema.  There are several open areas on the  skin edge, but these all have good granulation tissue at the base.  We  will continue to do normal saline wet-to-dry dressings twice daily, and  he will follow up with me in 2 weeks' time.  He will return sooner if  the wound has any deterioration.     [name], MD  Electronically Signed   CEF/MEDQ  D:  12/06/2009  T:  12/07/2009  Job:  [id number]   cc:   [name], M.D.  Antony Madura, M.D.

## 2010-08-27 NOTE — Assessment & Plan Note (Signed)
OFFICE VISIT   Donald Dudley, GRIEDER  DOB:  [birth year]                                       12/20/2009  EAVWU#:[id number]   The patient returns for followup today after his left below knee  amputation which required revision in August of 2011.  He previously had  a VAC dressing placed but has now been doing normal saline wet-to-dry  dressings.  He says there is less pain in the BKA.  This continues to  heal well.  He is doing dressing changes once daily.   PHYSICAL EXAM:  Blood pressure is 138/70 in the left arm, heart rate is  60 and regular.  Temperature is 97.9.  Left below knee amputation has a  4 x 1 cm x 2 mm depth open wound in the central aspect of his left below  knee amputation.  There is good pink granulation tissue.  The skin edges  are starting to contract some from the edges.  Overall the BKA continues  to heal well at this point.  Will continue once daily normal saline wet-  to-dry wound care and he will follow up in 2 weeks.     [name], MD  Electronically Signed   CEF/MEDQ  D:  12/20/2009  T:  12/21/2009  Job:  [id number]

## 2010-08-27 NOTE — Procedures (Signed)
CAROTID DUPLEX EXAM   INDICATION:  Follow up left carotid bruit.   HISTORY:  Diabetes:  Yes.  Cardiac:  No.  Hypertension:  Yes.  Smoking:  Quit in 1978.  Previous Surgery:  CV History:  Amaurosis Fugax No, Paresthesias No, Hemiparesis No                                       RIGHT             LEFT  Brachial systolic pressure:  Brachial Doppler waveforms:  Vertebral direction of flow:        Antegrade         Not well  visualized  DUPLEX VELOCITIES (cm/sec)  CCA peak systolic                   94                80  ECA peak systolic                   166               404  ICA peak systolic                   86                148  ICA end diastolic                   23                44  PLAQUE MORPHOLOGY:                  None              Heterogenous  PLAQUE AMOUNT:                      None              Moderate  PLAQUE LOCATION:                    ECA               ICA, ECA   IMPRESSION:  1. 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  2. Normal carotid duplex noted in the right internal carotid artery,      status post right carotid endarterectomy.  3. Antegrade right vertebral artery.  4. Bilateral external carotid artery stenosis noted, left greater than      right.   ___________________________________________  Donald Dudley. Hart Rochester, M.D.   MG/MEDQ  D:  01/18/2008  T:  01/18/2008  Job:  [id number]

## 2010-08-27 NOTE — H&P (Signed)
NAME:  Donald Dudley, Donald Dudley NO.:  [id number]   MEDICAL RECORD NO.:  [id number]          PATIENT TYPE:  INP   LOCATION:  3705                         FACILITY:  [facility]   PHYSICIAN:  [name], M.D. DATE OF BIRTH:  03-01-1941   DATE OF ADMISSION:  06/29/2007  DATE OF DISCHARGE:                              HISTORY & PHYSICAL   Donald Dudley is a 70 year old gentleman with a history of coronary  artery disease and coronary bypass grafting, atrial fibrillation and  flutter, diabetes mellitus, dyslipidemia, pulmonary hypertension,  peripheral vascular disease, chronic renal insufficiency.  He is now  admitted with 3 weeks of fatigue, malaise and 1 day of progressive back  pain.   The patient has history of coronary artery disease for years.  He had  coronary artery bypass grafting in 1993.  This consisted of the left  internal mammary artery to the LAD with a saphenous vein graft to the  right coronary artery.  He had angioplasty to the second diagonal artery  in 2004 by Donald Dudley.   In 2006, I performed heart catheterization and found that his second  diagonal artery was occluded, and the saphenous vein graft to the right  coronary artery was occluded.  At that time he was found have a patent  LIMA to LAD, a left circumflex system with minor irregularities.  The  native right coronary artery was occluded as was the saphenous vein  graft as noted above.   The patient has had some additional episodes of chest pain.  In fact, we  attempted to admit him to the hospital in December for heart  catheterization.  He called the day prior to the scheduled heart  catheterization and decided that he felt better and did not want to go  through with the heart catheterization.  He has felt fairly well and has  not reported any episodes of chest pain free.  Today he was in Dr.  Su Hilt' office and presented with several days of severe back pain  radiating to his right upper  quadrant and lower chest.  The pain does  not feel like his angina-like chest pain.  He did try several  nitroglycerin, and the pain eased up quite a bit.  He has been feeling  poorly for the past 3 weeks.  His only new medication is allopurinol.  He comments that he has been drinking lots of water with the  allopurinol.  He has had lots of fatigue and malaise, and his wife  states that he really has not had the energy to do much at all for the  past several weeks.  He denies any progressive shortness breath.  He  denies a viral syndrome.   CURRENT MEDICATIONS:  1. Include atenolol 100 mg p.o. b.i.d.  2 . Maxzide 75 mg.50 mg once a day.  1. Torsemide 20 mg p.o. b.i.d.  2. Insulin 70/30, 52 units twice a day.  3. Allopurinol 300 mg a day.  4. Imdur 30 mg a day.  5. Tramadol 50 mg twice a day.  6. He was previously on hydroxyurea 500 mg  three times a week.  I am      not sure if he is still on this.  He did not comment that he was on      that currently.   ALLERGIES:  He is intolerant to CRESTOR which causes leg cramps.  He is  also intolerant to MEVACOR, ZOCOR, and LIPITOR.  He is intolerant to  LISINOPRIL because of his renal insufficiency.  He is intolerant to  XANAX and AMLODIPINE.   PAST MEDICAL HISTORY:  1. Coronary artery disease.  He is status post coronary artery bypass      grafting with a LIMA to LAD and saphenous vein graft to the right      coronary artery.  Last heart catheterization in 2006 revealed an      occluded saphenous vein graft to the right coronary artery.  The      distal right coronary artery fills very slowly via right-to-right      collaterals. His second diagonal artery was also noted to be      occluded.  The left internal mammary artery to the LAD is patent,      and the left circumflex system has minor luminal irregularities and      is otherwise patent.  2. Pulmonary hypertension.  His echocardiogram in November 2004      reveals PA pressures of  48.  He was also noted to have mild mitral      regurgitation and mild to moderate tricuspid regurgitation at that      time.  3. Chronic renal insufficiency.  His baseline creatinine is between      1.7-0.2.  4. Diabetes mellitus, poorly controlled.  5. Dyslipidemia.  He is intolerant to multiple medications.  6. Atrial fibrillation/flutter.  7. Peripheral vascular disease.  He is status post renal artery      stenting and also right carotid endarterectomy.  8. Polycythemia.  It is unclear whether this is polycythemia vera      versus secondary polycythemia because of his pulmonary      hypertension.   SOCIAL HISTORY:  The patient has a history of smoking but quit several  years ago.  He does not work.   FAMILY HISTORY:  Is noncontributory.   REVIEW OF SYSTEMS:  The patient has been in generally poor health for a  year.  He has been going downhill for the past several weeks.   PHYSICAL EXAMINATION:  GENERAL:  He is an middle-age gentleman in no  acute distress.  He is alert and oriented x3, and his mood and affect  are normal.  VITAL SIGNS:  His blood pressure 137/59 with heart rate of 65.  HEENT AND NECK:  Exam reveals 2+ carotids.  He has a right carotid  endarterectomy scar.  There is no JVD.  LUNGS:  Clear.  MUSCULOSKELETAL:  He has no back tenderness.  There is no flank  tenderness.  HEART:  Regular rate.  S1-S2.  He has a soft systolic murmur at the left  sternal border.  ABDOMEN:  Exam reveals good bowel sounds.  He has no rebound.  He has no  areas of tenderness.  EXTREMITIES:  He has no clubbing, cyanosis or edema.  His pulses are 1+.  NEUROLOGIC:  Exam was nonfocal.  Cranial nerves II-XII intact, and motor  and sensory functions intact.   LABORATORY DATA:  Sodium 127, potassium 2.3, chloride 84. His BUN is 50,  creatinine 2.3, glucose 369.  His is  hemoglobin is 18.4, hematocrit 54%.  His troponin I is less than 0.05.  His myoglobin is 201.  His CPK-MB is  1.8.    IMPRESSION AND PLAN:  1. Chest pains and abdominal pain.  It is not clear if this represents      coronary artery anginal pain.  He states that the pain is clearly      different from his previous episodes of anginal pain.  He does have      multiple risk factors for coronary artery disease.  His EKG is      basically unchanged from his previous tracings and reveals normal      sinus rhythm.  He has diffuse ST depression with T-wave inversion      in leads I, II, III, and V4 through V6.  This was basically      unchanged from his previous tracings before.  He does have mild      elevation in lead aVR which is also unchanged from his previous      tracings.  We will continue to collect cardiac enzymes.  It is      possible that this may per present an aortic dissection.  His      creatinine is 2.33, and so he is at somewhat increased risk of      renal failure because of IV contrast dye.  Will need to work      through this very carefully.  We may be able to get a      transesophageal echocardiogram if it seems clinically important.      We may also need to end up just doing a CT of the chest with good      hydration to hopefully avoid renal failure.  We will continue to      assess him clinically.  Right now, he is currently pain free, so I      do not think that there is any emergent need to do this.  2. Pulmonary hypertension.  The patient has a history of pulmonary      hypertension.  He has a long history of smoking and has at least      some degree of chronic obstructive pulmonary disease.  I do not      have any pulmonary function tests.  I suspect that this pulmonary      hypertension may be causing his polycythemia.  Will get nighttime      oximetry to try to make sure that he does not have sleep apnea.      Will put on O2 at 2 liters per nasal cannula to keep his oxygen      above 90.  3. Hyponatremia.  The patient is on torsemide as well as      hydrochlorothiazide.  This is  most likely why his sodium and      potassium are so low.  We will give him some normal saline to help      increase this and will also give him some p.o. potassium.  This      certainly may be contributing to his fatigue and malaise.  4. Diabetes mellitus.  It is clear that his diabetes is not very well      controlled.  His glucose is 369.  We will start him back on his      regular insulin and will give him sliding scale insulin.  He may      need to  have some additional modifications.  Will consult the      [facility] for further help with this if needed during      the hospitalization.  5. Chronic renal insufficiency.  The patient has a history of renal      stents.  His creatinine is up a little bit, but I suspect that this      is due to overall volume depletion.  Will see if this improves with      rehydration.  6. Atrial fibrillation and atrial flutter.  The patient remains in      sinus rhythm for now.  Will continue with the same medications.           ______________________________  [name], M.D.     PJN/MEDQ  D:  06/29/2007  T:  06/29/2007  Job:  [id number]   cc:   Antony Madura, M.D.

## 2010-08-27 NOTE — Procedures (Signed)
DUPLEX DEEP VENOUS EXAM - LOWER EXTREMITY   INDICATION:  Right foot edema, rule out DVT.   HISTORY:  Edema:  Right lower leg/foot swelling since last surgery.  Trauma/Surgery:  Left below-knee amputation on 10/12/2009.  Pain:  Right lower leg/foot swelling.  PE:  No.  Previous DVT:  No.  Anticoagulants:  Other:  Patient states he has a history of heart problems.   DUPLEX EXAM:                CFV   SFV   PopV  PTV    GSV                R  L  R  L  R  L  R   L  R  L  Thrombosis    o  o  o     o     o      o  Spontaneous   o  o  o     o     o      o  Phasic        +  +  +     +     +      +  Augmentation  +  +  +     +     +      +  Compressible  +  +  +     +     +      +  Competent     +  +  +     +     +      +   Legend:  + - yes  o - no  p - partial  D - decreased   IMPRESSION:  No evidence of deep venous thrombosis noted in the right  lower extremity.  Pulsatile venous waveforms noted throughout the right  lower extremity and left common femoral vein.    _____________________________  [name], MD   CH/MEDQ  D:  10/31/2009  T:  11/01/2009  Job:  [id number]

## 2010-08-27 NOTE — Procedures (Signed)
RENAL ARTERY DUPLEX EVALUATION   INDICATION:  Followup evaluation of right renal stent which was placed  March 26, 2006, by Dr. Hart Rochester.   HISTORY:  Diabetes:  Yes.  Cardiac:  Coronary artery bypass graft in 1993.  Hypertension:  Yes.  Smoking:  Quit June 22, 1976.   RENAL ARTERY DUPLEX FINDINGS:  Aorta-Proximal:  87 cm/s  Aorta-Mid:  98 cm/s  Aorta-Distal:  201 cm/s  Celiac Artery Origin:  SMA Origin:  396 cm/s                                    RIGHT               LEFT  Renal Artery Origin:             73/10 cm/s          31/7 cm/s  Renal Artery Proximal:           138/15 cm/s         43/8 cm/s  Renal Artery Mid:                103/18 cm/s         55/8 cm/s  Renal Artery Distal:             70/12 cm/s          57/11 cm/s  Hilar Acceleration Time (AT):    0.039 seconds       0.039 seconds  Renal-Aortic Ratio (RAR):        1.5                 0.63  Kidney Size:                     10.5 cm x 5.8 cm    11.8 cm x 5.5 cm  End Diastolic Ratio (EDR):       0.20 to 0.26        0.30 to 0.22  Resistive Index (RI):            0.88                0.81   IMPRESSION:  1. Left common iliac artery peak systolic velocity 348 cm/s.  2. 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  3. 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  4. Kidneys are normal with respect to size and shape bilaterally.  5. Renal to aortic ratio suggests less than 60% renal artery stenosis      bilaterally.  6. Patent right renal artery stent.   ___________________________________________  Quita Skye. Hart Rochester, M.D.   MC/MEDQ  D:  07/20/2007  T:  07/20/2007  Job:  [id number]

## 2010-08-27 NOTE — Assessment & Plan Note (Signed)
OFFICE VISIT   Donald Dudley, Donald Dudley  DOB:  [birth year]                                       01/03/2010  QIONG#:[id number]   The patient returns for follow-up today.  He underwent revision of his  left below knee amputation on November 13, 2009.  On exam today the wound  continues to heal well.  The open wound portion is down to 1 cm x 3 mm  in size and there is good granulation tissue at the base.  I believe  this should be healed within the next few weeks.  His other complaint  today was he has chronic leg swelling in his right lower extremity.  He  does have a history of some cardiac issues as well as renal  insufficiency.  He also is keeping this in the dependent position most  of a day.  I believe some of this is multifactorial with leg dependency,  potentially some mild cardiac dysfunction and renal insufficiency.  I  believe the best option would be to check a BMET on him to see where his  BUN and creatinine are.  If these are not significantly elevated  then I  would consider placing him on some Lasix short term to see if we can  improve his leg swelling somewhat.  If his creatinine is elevated, we  may get in to follow up with Dr Terrial Rhodes first to help [place] in  management of this fluid overall.  He will follow up with me in 3 weeks'  time with a BMET at that time and also to recheck his wound.     [name], MD  Electronically Signed   CEF/MEDQ  D:  01/03/2010  T:  01/04/2010  Job:  [id number]   cc:   Terrial Rhodes, M.D.  [name], M.D.

## 2010-08-27 NOTE — Procedures (Signed)
RENAL ARTERY DUPLEX EVALUATION   INDICATION:  Follow up right renal artery stent which was placed  03/26/06 by Dr. Hart Rochester.  Left renal artery stent placed two years ago.   HISTORY:  Diabetes:  Yes.  Cardiac:  CABG.  Hypertension:  Yes.  Smoking:  Quit.   RENAL ARTERY DUPLEX FINDINGS:  Aorta-Proximal:  103 cm/s  Aorta-Mid:  131 cm/s  Aorta-Distal:  187 cm/s  Celiac Artery Origin:  SMA Origin:  525 cm/s                                    RIGHT               LEFT  Renal Artery Origin:             316/41 cm/s         228/45 cm/s  Renal Artery Proximal:           475/97 cm/s         101/19 cm/s  Renal Artery Mid:                96/16 cm/s          43/10 cm/s  Renal Artery Distal:             114/15 cm/s         93/17 cm/s  Hilar Acceleration Time (AT):    0.048 sec           0.19 sec  Renal-Aortic Ratio (RAR):        4.6                 2.2  Kidney Size:                     10.7 cm X 5.0 cm    11.3 cm X 5.0 cm  End Diastolic Ratio (EDR):  Resistive Index (RI):            0.80                0.83   IMPRESSION:  1. Right renal-aortic-ratio suggests >60% stenosis, an increase from      previous studies.  2. Left renal-to-aortic ratio suggests <60% stenosis, showing no      significant change from previous studies.  3. Elevated bilateral resistive index suggests parenchymal disease      bilaterally.  4. Bilateral kidneys are within normal limits in regard to shape and      size.  5. 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   Appointment made to see Dr. Hart Rochester on 01/25/08.      ___________________________________________  Quita Skye. Hart Rochester, M.D.   AS/MEDQ  D:  01/03/2008  T:  01/03/2008  Job:  [id number]

## 2010-08-27 NOTE — Assessment & Plan Note (Signed)
OFFICE VISIT   Donald Dudley, Donald Dudley  DOB:  1940/11/01                                       10/31/2009  JXBJY#:[id number]   The patient returns for follow-up today.  He underwent a left below knee  amputation on July 1.  He presents today for follow-up.   On physical exam today he has had ischemic skin edges in the below knee  amputation.  There is a small amount of serous drainage.  There is mild  erythema but no obvious fluctuance.  There is still significant edema in  the BKA stump.  He also has some edema in the right foot.  Overall he  states his pain is about the same as when he left the hospital.  He  denies any fever or chills.   We did a duplex ultrasound of his right lower extremity today which  showed no evidence of  DVT.  He also had no evidence of DVT in the left  femoral vein.   I discussed with the patient today that the wound edges have not  completely healed of his below knee amputation and he is still at risk  that this wound may not heal and he may need further revision of his  amputation.  We will see him back again in 2 weeks' time.  I counseled  him today using an Ace wrap to try to decrease edema of his stump.  Hopefully this will continue to heal.     [name], MD  Electronically Signed   CEF/MEDQ  D:  10/31/2009  T:  11/01/2009  Job:  [id number]   cc:   Antony Madura, M.D.

## 2010-08-27 NOTE — Assessment & Plan Note (Signed)
OFFICE VISIT   Donald, Dudley  DOB:  Mar 28, 1941                                       10/10/2009  ZOXWR#:[id number]   The patient returns for followup today.  We attempted to do an  arteriogram on him last Friday.  However, he did not tolerate CO2  injection and due to his renal insufficiency I did not believe it was in  his best interest to receive a large contrast load that could have  pushed him into dialysis.  In discussions with the patient at that time  we were considering whether or not do a primary below knee amputation.  He returns today for further discussion regarding this.  He states that  his left foot is in constant pain and at this point he is ready to  undergo an amputation.  Of note, he is also being followed by Dr. Bradly Bienenstock for a right second finger wound.  He is currently doing Adaptic  dressings with dry gauze to this and apparently he needs amputation of  his finger at some point in the future but Dr. Melvyn Novas said he would  prefer to wait until his leg issues were settled first.   On physical exam today blood pressure is 146/71 in the left arm, heart  rate 69 and regular.  Oxygen saturations 97%-98% on room air,  temperature is 97.8.  The left foot has a nonhealing ulcer over the  first metatarsal head that is approximately 3 cm in diameter.  The base  of the wound is pale with fibrinous exudate.  There is a small amount of  serous drainage.  There is no surrounding cellulitis.  The left foot is  cool compared to the right.   At this point the patient wishes to have a primary left below knee  amputation.  He does not have a palpable popliteal pulse and I did  discuss with him the risk of 5%-10% that the amputation may not heal and  he may need a later left above knee amputation.  He understands and  agrees to proceed.  We have scheduled his procedure for Friday July 1.  His doxycycline prescription was renewed today.  He will  need to  continue to take this for his finger post procedure.     [name], MD  Electronically Signed   CEF/MEDQ  D:  10/10/2009  T:  10/11/2009  Job:  [id number]   cc:   Antony Madura, M.D.  Maxwell Caul, M.D.  Leighton Roach. Truett Perna, M.D.

## 2010-08-30 NOTE — Op Note (Signed)
NAME:  Donald Dudley, Donald Dudley NO.:  [id number]   MEDICAL RECORD NO.:  [id number]          PATIENT TYPE:  AMB   LOCATION:  SDS                          FACILITY:  [facility]   PHYSICIAN:  Quita Skye. Hart Rochester, M.D.  DATE OF BIRTH:  February 06, 1941   DATE OF PROCEDURE:  03/26/2006  DATE OF DISCHARGE:                               OPERATIVE REPORT   PREOPERATIVE DIAGNOSIS:  Severe right renal artery stenosis with renal  insufficiency and uncontrolled hypertension status post left renal  artery stent.   POSTOP DIAGNOSIS:  Severe right renal artery stenosis with renal  insufficiency and uncontrolled hypertension status post left renal  artery stent.   PROCEDURE:  1. Abdominal aortogram via right common femoral approach with CO2  2. Selective bilateral renal angiograms  3. PTA and stenting of right renal artery using a new 5 mm x 15 mm      Genesis on aviator with inflation of 10 atmospheres at 47 seconds      followed by second inflation using a 6 mm x 15 mm aviator PTA      catheter inflated at 10 atmospheres for 30 seconds and 8      atmospheres for 27 seconds with completion renal angiogram.   SURGEON:  Dr.  Hart Rochester   ANESTHESIA:  Local Xylocaine, Versed 2 mg intravenously   COMPLICATIONS:  None.   CONTRAST:  60 mL and CO2 120 mL.   DESCRIPTION OF PROCEDURE:  The patient was taken to [facility]  peripheral endovascular lab placed in supine position at which time the  right groin was prepped Betadine solution and draped in a routine  sterile manner.  After infiltration of 1% Xylocaine, the right common  femoral artery was entered percutaneously.  Guidewire passed into the  suprarenal aorta under fluoroscopic guidance.  A 6-French sheath and  dilator were passed over the guide wire dilator removed the standard  pigtail catheter positioned in the suprarenal aorta.  A CO2 angiogram  was performed in the peri renal area, injecting 60 mL of CO2 on two  occasions.  This revealed the  aorta to be widely patent with 85-90%  proximal right renal artery stenosis with a good distal vessel.  There  was a stent in the proximal 2 cm of left renal artery which appeared to  be widely patent but the detail was not good on the CO2 injections.  Following this it was decided to proceed with right renal artery PTA and  stenting as had been discussed with the patient prior to the procedure.  4000 units of heparin was given intravenously.  The pigtail catheter was  exchanged for a JR-4 guide catheter and the right renal artery orifice  was engaged and 0.014 stabilizer wire was advanced into the secondary  branches.  A selective right renal angiogram was then performed with  contrast which revealed better detail of this proximal right renal  artery stenosis.  Primary angioplasty and stenting was then performed  using a 15 mm x 5 mm genesis on aviator system with the initial  inflation of 10 atmospheres at 47 seconds.  Completion  angiogram  revealed some mild residual narrowing within the stent.  Therefore a  second inflation using a 6 mm x 15 mm aviator catheter was performed,  inflated 10 atmospheres for 30 seconds with an excellent result.  To  flare the orifice of the stent second inflation was done with the  catheter protruding into the aorta inflated 8 atmospheres for 27  seconds.  Completion angiogram revealed excellent cosmetic result.  Following this the left renal artery was engaged with the JR-4 catheter  and selective left renal angiogram was performed which did not reveal  any significance in-stent stenosis on the left side.  Having tolerated  procedure well.  The catheter and sheath were removed after correction  of the ACT, adequate compression applied.  No complications ensued.   FINDINGS:  1. Widely patent perirenal aorta.  2. 85-90% proximal right renal artery stenosis.  3. No evidence of significant in-stent stenosis in left renal artery      stent.  4. Successful  PTA and primary stenting of right renal artery using a 5      x 15 genesis on aviator inflated at 10 atmospheres from 47 seconds      with additional inflations using a 6 x 15 aviator inflated at 10      atmospheres for 30 seconds an 8 atmospheres for 27 seconds.           ______________________________  Quita Skye Hart Rochester, M.D.     JDL/MEDQ  D:  03/26/2006  T:  03/26/2006  Job:  [id number]

## 2010-08-30 NOTE — Consult Note (Signed)
NAME:  Donald Dudley, Donald Dudley NO.:  [id number]   MEDICAL RECORD NO.:  [id number]          PATIENT TYPE:  INP   LOCATION:  3308                         FACILITY:  [facility]   PHYSICIAN:  Isidor Holts, M.D.  DATE OF BIRTH:  1941-03-21   DATE OF CONSULTATION:  01/14/2006  DATE OF DISCHARGE:                                   CONSULTATION   PRIMARY MEDICAL DOCTOR:  Dr. Zenaida Deed.   REQUESTING DOCTOR:  Dr. Kristeen Miss.   REASON FOR CONSULTATION:  Dyspnea/pneumonia.   HISTORY OF PRESENT ILLNESS:  This is 70 year old male. For past medical  history, see below.  Patient was admitted on January 12, 2006 with mild  dyspnea and central chest pain, radiating to both arms, felt to be  significant for possible coronary artery disease, against a background of  patient's significant risk factors for same, and was found to have atrial  flutter with 3:1 to 4:1 AV conduction.  He was managed with IV nitroglycerin  and heparin, possible percutaneous intervention was contemplated.  Medical  team was requested to consult on January 14, 2006 by Dr. Elease Hashimoto because of  possibility of pneumonia, against the background of continuing dyspnea,  atypical sounding chest pain and elevation of the WBC, as well as low grade  fever.   PAST MEDICAL HISTORY:  1. Type 2 diabetes mellitus, insulin requiring.  2. Coronary artery disease, status post CABG in 1993.  3. Hypertension.  4. Right carotid artery stenosis, status post carotid endarterectomy in      1993.  5. Dyslipidemia.  6. Renal insufficiency.  7. Left renal artery stenosis, status post stent.  8. Polycythemia vera.  9. Atrial fibrillation/flutter.  10.Prior history of GI bleed while on Aspirin/Plavix.   PREADMISSION MEDICATIONS:  1. Atenolol 100 mg p.o. b.i.d.  2. Triamterene/HCTZ (75/25) one p.o. daily.  3. Torsemide 20 mg p.o. b.i.d.  4. Gemfibrozil 300 mg p.o. b.i.d.  5. Aspirin 81 mg p.o. daily.  6. Hydroxyurea 500 mg p.o. one  Mondays, Wednesday and Fridays.  7. Insulin (70/30) 50 units subcutaneously q.a.m. and 40 units      subcutaneously q.p.m.   ALLERGIES/SENSITIVITIES:  1. CRESTOR.  2. MICARDIS.  3. NORVASC.  4. ALTACE.  5. PLAVIX.   REVIEW OF SYSTEMS:  Patient denies cough.  Denies pleuritic type chest pain.  Has no fever or chills.  Admits, however, to hesitancy of micturition, but  no dysuria, however.  Systems review is, otherwise, negative.   SOCIAL HISTORY:  Patient is retired.  He works part-time as a Curator.  He  lives with his wife in [place].  Ex-smoker, quit in 1978.  Nondrinker.  Has no history of drug abuse.   FAMILY HISTORY:  Significant for coronary artery disease/hypertension.  Many  members of his family died in their early 60s as a result of heart disease.   PHYSICAL EXAMINATION:  VITAL SIGNS:  Temperature 97.9.  Pulse 68 per minute.  Respiratory rate 15.  BP 130/60 mmHg.  Pulse oximetry 98% on room air.  GENERAL:  Patient appears quite comfortable, not short of breath at rest,  communicative.  HEENT:  No clinical pallor.  No jaundice.  No conjunctival injection.  NECK:  Supple.  JVP not seen.  No palpable  lymphadenopathy.  No palpable  goiter.  CHEST:  Clear to auscultation.  No wheezes or crackles.  HEART:  Sounds 1 and 2 are normal.  No murmurs.  ABDOMEN:  Soft and nontender.  There is no palpable organomegaly.  No  palpable masses.  Normal bowel sounds.  LOWER EXTREMITY EXAMINATION:  No pitting edema.  Palpable peripheral pulses.  MUSCULOSKELETAL SYSTEM:  Was not formally examined.  CENTRAL NERVOUS SYSTEM:  No focal neurologic deficits on gross examination.   INVESTIGATIONS:  CBC:  WBC 21.2, hemoglobin 13.3, hematocrit 43.4, platelets  360.  Electrolytes:  Sodium 133, potassium 3.1, chloride 90, CO2 30, BUN 25,  creatinine 1.9, glucose 152.  BNP 206.  Urinalysis negative.  CBG shows  blood glucose 115 to 143.  Chest x-ray dated January 12, 2006 shows no acute   findings.  EKG dated January 14, 2006, shows atrial fibrillation with  possible right bundle branch block, rate 71 per minute.   ASSESSMENT/PLAN/RECOMMENDATIONS:  1. Dyspnea/chest pain.  The patient has elevated white blood cell count,      no cough, no significant pyrexia and chest x-ray of January 12, 2006 was      negative.  Chest is clinically clear to auscultation.  The evidence for      pneumonia is not unequivocal.  We will recommend repeat 2-view chest x-      ray, but will continue cefepime, in view of the possibility of      nosocomial pneumonia. An important differential diagnostic      consideration, however, is pulmonary embolism, against a background of      history of polycythemia.  Patient is already on anticoagulation,      however, this should be investigated.  In view of elevated creatinine      levels, we will be unable to do a chest CT angiogram.  We will,      therefore, arrange D-dimers and a ventilation-perfusion lung scan.   1. Hypertension.  This is controlled.  Will continue current anti-      hypertensive treatment.   1. Diabetes mellitus.  This appears reasonably well controlled.  We will      continue carbohydrate modified diet, sliding scale insulin coverage and      scheduled insulin.   1. Dyslipidemia.  The patient is on Gemfibrozil, will continue this.   1. History of coronary artery disease/atypical chest pain.  We will defer      management to cardiology.   1. Atrial fibrillation/flutter.  This is rate controlled.  Once again, we      will defer management to cardiology.   1. BPH per history.  We will commence Flomax in an initial dose of 0.4 mg      p.o. daily.  Check PSA.  Repeat urinalysis.     Further management will depend on clinical course.   Thank you for the consultation.  We will follow up with you.      Isidor Holts, M.D.  Electronically Signed    CO/MEDQ  D:  01/14/2006  T:  01/14/2006  Job:  [id number]   cc:   Antony Madura, M.D.  [name], M.D.

## 2010-08-30 NOTE — Procedures (Signed)
[place]  Patient:    Donald Dudley, Donald Dudley Visit Number: [id number] MRN: [id number]          Service Type: DSU Location: [facility] 2871 [id number] Attending Physician:  Silvestre Mesi Dictated by:   Aram Candela Aleen Campi, M.D. Proc. Date: 03/15/01 Admit Date:  03/15/2001 Discharge Date: 03/15/2001   CC:         Peripheral Vascular Angiography Lab, 6th Floor, [facility]   Procedure Report  PROCEDURE DONE BY:  Aram Candela. Aleen Campi, M.D.  PROCEDURES: 1. Percutaneous insertion of catheter in abdominal aorta. 2. Pressure measurements in abdominal aorta and renal arteries. 3. Abdominal aortogram. 4. Selective renal artery angiography bilateral. 5. Angioplasty with primary stent placement in the right renal artery. 6. Perclose of the right femoral artery.  INDICATION FOR PROCEDURES:  This 70 year old male has a history of severe hypertension and coronary artery disease.  He was noted to have severe left renal artery stenosis on prior angiography and a follow-up ultrasound of his renal arteries in our office showed a very high elevation in flow velocity in the left renal artery.  The flow velocity in his right renal artery was normal.  He was then considered for possible angioplasty because of his continued severe elevation of his blood pressure and because of the marked elevation in flow velocity of his left renal artery of greater than 250 cm per second.  DESCRIPTION OF PROCEDURE:  After signing an informed consent, the patient was premedicated with 50 mg of Benadryl intravenously and brought to the peripheral vascular catheterization lab on the sixth floor of [facility].  His right groin was prepped and draped in a sterile fashion and anesthetized locally with 1% lidocaine.  A 5-French introducer sheath was inserted percutaneously into the right femoral artery.  A 5-French short pigtail catheter was inserted through the right femoral artery  sheath and advanced to the abdominal aorta.  Pressures were recorded and a midstream injection was made into the abdominal aorta.  This catheter was exchanged for a 5-French short right coronary catheter which was advanced to the abdominal aorta.  Injections were made selectively into the right and left renal arteries.  After noting a severe stenosis in the proximal segment of the left renal artery, we then exchanged the 5-French sheath for a 6-French sheath in the right femoral artery.  We then selected a short JR4 6-French guide catheter which was advanced to the mid abdominal aorta.  After engaging this guide catheter in the ostium of the left renal artery, we then advanced a 0.014 stabilizer guidewire through the guide catheter and easily passed this into the left renal artery.  After sizing the vessel and lesion, we then selected a 6- x 12-mm Genesis stent deployment system which was advanced over the guidewire and positioned within the proximal left renal artery lesion. Two inflations were made, the first at eight atmospheres for 30 seconds and the second at 10 atmospheres for 30 seconds.  After this final inflation, the balloon catheter was removed and final injections into the left renal artery showed an excellent angiographic result with 0% residual lesion and no evidence for dissection or clot.  There was normal antegrade flow.  There was a small minor left renal artery branch arising from the proximal segment which supplied the superior pole.  This branch remained open with normal flow. After obtaining this excellent stent deployment in the left renal artery, the catheter and sheath were removed from the right  femoral artery and hemostasis was easily obtained with a Perclose closure system.  MEDICATIONS GIVEN:  Heparin 2500 units IV, labetalol 10 mg IV x 2, Versed 1 mg IV, Fentanyl 50 mcg IV.  HEMODYNAMIC DATA:  Abdominal aorta pressure 220/90.  Left renal artery pressure  140/60 predilatation.  No gradient was seen post dilation.  CINE FINDINGS: 1. Abdominal aorta:  The abdominal aorta has diffuse atherosclerotic plaque    which is nonobstructive and mildly calcified.  There is no evidence of    aneurysm.  The bifurcation appears normal. 2. Renal arteries:  The right renal artery appears normal.  Left renal artery:    The ostium appears normal.  Approximately 5 mm from the ostium, there is a    severe eccentric plaque which causes an 80-90% stenosis and slow antegrade    flow.  There was a small branch arising from this lesion area which    supplied the superior pole of the left kidney.  In the mid to distal    segment of the left renal artery, there is a large branch which caused a    large bifurcation with the lower branch supplying the lower pole of the    left kidney.  The takeoff of this branch has a 30-40% stenosis.  ANGIOPLASTY CINES:  Cines taken during the angioplasty procedure showed proper positioning of the guidewire and stent deployment system and a very good balloon form obtained.  Final injections into the left renal artery showed an excellent angiographic result with 0% residual lesion and no evidence for dissection or clot.  FINAL DIAGNOSES: 1. Severe stenosis, left renal artery. 2. Successful angioplasty with primary stenting of the left renal artery. 3. Moderate diffuse atherosclerosis of the abdominal aorta without significant    stenosis or evidence of aneurysm. 4. Successful Perclose of the right femoral artery.  DISPOSITION:  After monitoring on the short-stay unit for several hours, will anticipate discharge later today.   Dictated by:   Aram Candela. Aleen Campi, M.D.  Attending Physician:  Silvestre Mesi DD:  03/15/01 TD:  03/15/01 Job: [id number]

## 2010-08-30 NOTE — Cardiovascular Report (Signed)
NAME:  DERAY, DAWES NO.:  [id number]   MEDICAL RECORD NO.:  [id number]          PATIENT TYPE:  INP   LOCATION:  4707                         FACILITY:  [facility]   PHYSICIAN:  [name], M.D. DATE OF BIRTH:  1940-05-26   DATE OF PROCEDURE:  01/30/2005  DATE OF DISCHARGE:                              CARDIAC CATHETERIZATION   Mr. Mende is a 69 year old gentleman with a history of coronary artery  disease, peripheral vascular disease, and polycythemia.  He is referred for  heart catheterization after being admitted yesterday with episodes of chest  pain and shortness of breath.   PROCEDURE:  Left heart catheterization with coronary angiography.   The right femoral artery was easily cannulated using a Seldinger technique.   HEMODYNAMICS:  The LV pressure is 151/17 with an aortic pressure of 151/81.   ANGIOGRAPHY:  The left main has minor luminal irregularities.   The left anterior descending artery is occluded after a first septal  perforator.  The first diagonal artery is a fairly moderate to large vessel  and is essentially normal.  There perhaps are a few minor irregularities.  The second diagonal artery which is the site of the previous PTCA in 2004 is  flush occluded.  The LAD is occluded at its mid point.  The distal LAD fills  via the IMA graft which we will discuss later.   The left circumflex artery is a large vessel.  There are minor luminal  irregularities.  It gives off a moderate sized obtuse marginal artery and a  large posterolateral branch.  There is only minor luminal irregularities in  the whole system.   The right coronary artery is occluded proximally.  The mid and distal right  coronary artery fill via right-to-right collaterals.  There is TIMI grade 1  flow down the mid and distal right coronary artery.   The saphenous vein graft to the right coronary artery is occluded as noted  in previous catheterizations.   The left  internal mammary artery to LAD is a normal graft.  The anastomosis  to the LAD is normal.  The distal LAD has only minor luminal irregularities.   LEFT VENTRICULOGRAM:  The left ventriculogram was performed in a 30 RAO  position.  It reveals normal left ventricular systolic function.  The  ejection fraction is approximately 65%.   COMPLICATIONS:  None.   CONCLUSION:  Diffuse coronary artery disease.  The second diagonal artery  that was angioplastied in 2004 is now occluded.  It is no longer a candidate  for PTCA since it is now flush occluded.   Will continue with medical therapy for his diffuse coronary artery disease.  He has normal left ventricular systolic function.  The fact that his enzymes  are negative suggest that the occlusion of the second diagonal artery was  not recent.   He remains in atrial flutter.  We will need to start him on Coumadin therapy  which we will start tonight.           ______________________________  [name], M.D.     PJN/MEDQ  D:  01/30/2005  T:  01/30/2005  Job:  [id number]   cc:   Antony Madura, M.D.  Fax: [phone]

## 2010-10-08 ENCOUNTER — Encounter: Payer: Self-pay | Admitting: *Deleted

## 2010-10-08 ENCOUNTER — Telehealth: Payer: Self-pay | Admitting: *Deleted

## 2010-10-08 DIAGNOSIS — I4891 Unspecified atrial fibrillation: Secondary | ICD-10-CM

## 2010-10-08 DIAGNOSIS — Z01818 Encounter for other preprocedural examination: Secondary | ICD-10-CM

## 2010-10-08 NOTE — Telephone Encounter (Signed)
Pt wants app asap for right knee surgery, pt not seen for greater than a year, needs lexiscan/ afib per lori gerhardt np and needs app with dr Elease Hashimoto. Orders placed and pt was scheduled. Alfonso Ramus RN

## 2010-10-09 ENCOUNTER — Encounter: Payer: Self-pay | Admitting: *Deleted

## 2010-10-10 ENCOUNTER — Encounter: Payer: Self-pay | Admitting: *Deleted

## 2010-10-11 ENCOUNTER — Encounter: Payer: Self-pay | Admitting: Cardiovascular Disease

## 2010-10-11 ENCOUNTER — Ambulatory Visit (INDEPENDENT_AMBULATORY_CARE_PROVIDER_SITE_OTHER): Payer: Medicare Other | Admitting: Cardiovascular Disease

## 2010-10-11 VITALS — BP 118/62 | HR 68 | Ht 68.0 in | Wt 175.0 lb

## 2010-10-11 DIAGNOSIS — I2510 Atherosclerotic heart disease of native coronary artery without angina pectoris: Secondary | ICD-10-CM

## 2010-10-11 DIAGNOSIS — I4891 Unspecified atrial fibrillation: Secondary | ICD-10-CM | POA: Insufficient documentation

## 2010-10-11 DIAGNOSIS — I10 Essential (primary) hypertension: Secondary | ICD-10-CM | POA: Insufficient documentation

## 2010-10-11 NOTE — Assessment & Plan Note (Signed)
The patient remains in A-Fib.  His rate is well controlled.

## 2010-10-11 NOTE — Assessment & Plan Note (Signed)
Donald Dudley has a history of significantly artery disease and status post coronary artery bypass grafting in 1993. His right coronary system is occluded. He also has an occluded second diagonal vessel. His left ventricular systolic function has been normal at the time of most recent evaluations. He needs to have right knee surgery. I like to get a LexiScan might be said for further evaluation. If he has normal left ventricular systolic function, then I think he would be at low risk for his upcoming surgery. We will most certainly see some defects in his inferior wall and perhaps a small defect  in his anterolateral wall.

## 2010-10-11 NOTE — Progress Notes (Signed)
[name] Date of Birth  Jun 19, 1940 [facility] [address]     [address] [place], [place]  [address] [phone]  Fax  [phone]  History of Present Illness:  Milon is an elderly gentleman with a history of coronary artery disease. He status post CABG in 1993. He had heart catheterization in 2006 which revealed severe native coronary artery disease. The LIMA to LAD was patent. Left circumflex artery had moderate luminal regularties. The right coronary artery was occluded and the saphenous vein graft to the right coronary artery was occluded. He also has a history of hypertension and diabetes mellitus. He has a history of significant peripheral vascular disease and is status post left below-the-knee amputation.  He does have his left leg imitative instead of undergoing lower extremity angiogram which would put him at risk of needing dialysis. He refuses to go on dialysis because of the  poor quality of life that is associated with dialysis.  His general health has continued to slowly and progressively declined. He's obviously depressed. He now needs to have right knee surgery because of severe arthritis and pain.  He is not able to exercise.  The patient has a history of a fibrillation. We've had him on Coumadin in the past but we stopped it when he had a significant GI bleed.  Current Outpatient Prescriptions on File Prior to Visit  Medication Sig Dispense Refill  . atenolol (TENORMIN) 100 MG tablet Take 100 mg by mouth 2 (two) times daily.       . Hydrocodone-Acetaminophen (VICODIN PO) Take 650 mg by mouth daily.        . hydroxyurea (HYDREA) 500 MG capsule Take 500 mg by mouth 3 (three) times a week. 5 times weekly or as directed --  May take with food to minimize GI side effects.      . insulin NPH-insulin regular (NOVOLIN 70/30) (70-30) 100 UNIT/ML injection Inject into the skin as directed.        . isosorbide mononitrate (IMDUR) 30 MG  24 hr tablet Take 30 mg by mouth daily.        . nitroGLYCERIN (NITROSTAT) 0.4 MG SL tablet Place 0.4 mg under the tongue every 5 (five) minutes as needed.        . Tamsulosin HCl (FLOMAX) 0.4 MG CAPS Take 0.4 mg by mouth daily.        Marland Kitchen torsemide (DEMADEX) 20 MG tablet Take 40 mg by mouth daily.        Marland Kitchen triamterene-hydrochlorothiazide (MAXZIDE) 75-50 MG per tablet Take 1 tablet by mouth daily.        Marland Kitchen zolpidem (AMBIEN) 10 MG tablet Take 10 mg by mouth at bedtime as needed.        Marland Kitchen DISCONTD: traMADol (ULTRAM) 50 MG tablet Take 50 mg by mouth as needed.          Allergies  Allergen Reactions  . Amiodarone   . Colchicine   . Crestor (Rosuvastatin Calcium)   . Klor-Con   . Lipitor (Atorvastatin Calcium)   . Lisinopril   . Mevacor (Lovastatin)     GI-bleed   . Micardis (Telmisartan)   . Neurontin (Gabapentin)   . Norvasc (Amlodipine Besylate)   . Ranexa   . Zocor (Simvastatin - High Dose)     Past Medical History  Diagnosis Date  . Diabetes mellitus   . Hypertension   . MI (myocardial infarction)   . S/P BKA (below knee  amputation)   . History of cholecystectomy   . Hx of CABG   . PVD (peripheral vascular disease)   . Peripheral neuropathy   . Dyslipidemia   . Arthritis   . Atrial fibrillation   . Hyperlipidemia   . Hypercholesterolemia   . Chest pain   . CAD (coronary artery disease)     CABG - 1993  /  PTCA - 2004  /  Cath - 2006  . Gout   . Chronic kidney disease, stage III (moderate)   . CLL (chronic lymphocytic leukemia)   . CHF (congestive heart failure)   . Bilateral renal artery stenosis     status post stents  . Polycythemia     Past Surgical History  Procedure Date  . Coronary angioplasty with stent placement 2004    second diagonal artery -- [name], M.D.   . Cardiac catheterization 2006    Est. EF of 65% -- Diffuse coronary artery disease.  The second diagonal artery that was angioplastied in 2004 is now occluded.  It is no longer a  candidate for PTCA since it is now flush occluded.  Normal LV systolic function.  [name], M.D.  . Renal artery stent 2003    Bilateral renal artery stenosis  . Total knee arthroplasty     left  . Coronary artery bypass graft 1993    left internal mammary artery to his LAD artery --   . Coronary artery bypass graft 1994    vein graft failure of in his right coronary artery  . Distal clavicle excision 2003    SURGEON:  [name], M.D.  . Tendon repair     Left Achilles repair x2    History  Smoking status  . Former Smoker  . Types: Cigarettes  . Quit date: 04/14/1976  Smokeless tobacco  . Not on file    History  Alcohol Use No    Family History  Problem Relation Age of Onset  . Coronary artery disease Father   . Coronary artery disease Mother   . Diabetes      siblings  . Hypertension Daughter   . Heart failure Mother   . Dementia Mother     Reviw of Systems:  Reviewed in the HPI.  All other systems are negative.  Physical Exam: BP 118/62  Pulse 68  Ht 5\' 8"  (1.727 m)  Wt 175 lb (79.379 kg)  BMI 26.61 kg/m2 The patient is alert and oriented x 3.  He is depressed.  Skin: warm and dry.  Color is somewhat ashen.   HEENT:   the sclera are nonicteric.  The mucous membranes are moist.  The carotids are 2+ without bruits.  There is no thyromegaly.  There is no JVD.    Lungs: clear.  The chest wall is non tender.    Heart: Irregular rate with a normal S1 and S2.  There are no murmurs, gallops, or rubs. The PMI is not displaced.     Abdomin: good bowel sounds.  There is no guarding or rebound.  There is no hepatosplenomegaly or tenderness.  There are no masses.   Extremities:  no clubbing, cyanosis, or edema. He status post left above-the-knee".  Neuro:  Cranial nerves II - XII are intact.  Motor and sensory functions are intact.    He was examined in wheelchair.  ECG: Atrial fibrillation with controlled ventricular response. He has a right  bundle branch block. Is an old anteroseptal myocardial  infarction.  Assessment / Plan:

## 2010-10-14 ENCOUNTER — Ambulatory Visit (HOSPITAL_COMMUNITY): Payer: Medicare Other | Attending: Cardiovascular Disease | Admitting: Radiology

## 2010-10-14 DIAGNOSIS — I4891 Unspecified atrial fibrillation: Secondary | ICD-10-CM

## 2010-10-14 DIAGNOSIS — E119 Type 2 diabetes mellitus without complications: Secondary | ICD-10-CM

## 2010-10-14 DIAGNOSIS — I25810 Atherosclerosis of coronary artery bypass graft(s) without angina pectoris: Secondary | ICD-10-CM

## 2010-10-14 DIAGNOSIS — I2510 Atherosclerotic heart disease of native coronary artery without angina pectoris: Secondary | ICD-10-CM

## 2010-10-14 DIAGNOSIS — Z01810 Encounter for preprocedural cardiovascular examination: Secondary | ICD-10-CM | POA: Insufficient documentation

## 2010-10-15 ENCOUNTER — Ambulatory Visit (HOSPITAL_COMMUNITY): Payer: Medicare Other | Attending: Cardiovascular Disease | Admitting: Radiology

## 2010-10-15 DIAGNOSIS — I4891 Unspecified atrial fibrillation: Secondary | ICD-10-CM | POA: Insufficient documentation

## 2010-10-15 DIAGNOSIS — I25810 Atherosclerosis of coronary artery bypass graft(s) without angina pectoris: Secondary | ICD-10-CM

## 2010-10-15 DIAGNOSIS — R0989 Other specified symptoms and signs involving the circulatory and respiratory systems: Secondary | ICD-10-CM

## 2010-10-15 DIAGNOSIS — Z01818 Encounter for other preprocedural examination: Secondary | ICD-10-CM

## 2010-10-15 DIAGNOSIS — Z01810 Encounter for preprocedural cardiovascular examination: Secondary | ICD-10-CM

## 2010-10-15 MED ORDER — REGADENOSON 0.4 MG/5ML IV SOLN
0.4000 mg | Freq: Once | INTRAVENOUS | Status: AC
  Administered 2010-10-15: 0.4 mg via INTRAVENOUS

## 2010-10-15 MED ORDER — TECHNETIUM TC 99M TETROFOSMIN IV KIT: 11.0000 | PACK | Freq: Once | INTRAVENOUS | Status: AC | PRN

## 2010-10-15 MED ORDER — TECHNETIUM TC 99M TETROFOSMIN IV KIT
33.0000 | PACK | Freq: Once | INTRAVENOUS | Status: AC | PRN
  Administered 2010-10-15: 33 via INTRAVENOUS

## 2010-10-15 NOTE — Progress Notes (Addendum)
[facility] SITE 3 NUCLEAR MED [address] [place] [address] [phone]  Cardiology Nuclear Med Study  Donald Dudley is a 70 y.o. male [id number] Nov 04, 1940   Nuclear Med Background Indication for Stress Test:  Evaluation for Ischemia and Surgical Clearance: Pending (R) knee surgery History: '04 Angioplasty, '93 CABG, '06 Heart Cath: EF 65%, MI, AFIB/AFLUTTER  Cardiac Risk Factors: Family History - CAD, History of Smoking, Hypertension, IDDM Type 2, Lipids and PVD  Symptoms:  Palpitations   Nuclear Pre-Procedure Caffeine/Decaff Intake:  None NPO After: 6:00pm   Lungs:  clear IV 0.9% NS with Angio Cath:  20g  IV Site: R Hand  IV Started by:  Irean Hong, RN  Chest Size (in):  38 Cup Size: n/a  Height: 5\' 8"  (1.727 m)  Weight:  153 lb (69.4 kg)  BMI:  Body mass index is 23.26 kg/(m^2). Tech Comments:  Held atenolol x 48 hrs; FBS 128 at 6:30am per patient; no insulin today.    Nuclear Med Study 1 or 2 day study: 2 day  Stress Test Type:  Eugenie Birks  Reading MD: Charlton Haws, MD  Order Authorizing Provider:  P.Nahser  Resting Radionuclide: Technetium 59m Tetrofosmin  Resting Radionuclide Dose: 11 mCi   Stress Radionuclide:  Technetium 21m Tetrofosmin  Stress Radionuclide Dose: 33 mCi           Stress Protocol Rest HR: 61 Stress HR: 75  Rest BP: 111/66 Stress BP: 121/50  Exercise Time (min): n/a METS: n/a   Predicted Max HR: 148 bpm % Max HR: 50.68 bpm Rate Pressure Product: 9075   Dose of Adenosine (mg):  n/a Dose of Lexiscan: 0.4 mg  Dose of Atropine (mg): n/a Dose of Dobutamine: n/a mcg/kg/min (at max HR)  Stress Test Technologist: Milana Na, EMT-P  Nuclear Technologist:  Doyne Keel, CNMT     Rest Procedure:  Myocardial perfusion imaging was performed at rest 45 minutes following the intravenous administration of Technetium 68m Tetrofosmin. Rest ECG: Atrial Fibrilliation  Stress Procedure:  The patient received IV Lexiscan 0.4  mg over 15-seconds.  Technetium 49m Tetrofosmin injected at 30-seconds.  There were no significant changes and occ pvcs with Lexiscan.  Quantitative spect images were obtained after a 45 minute delay. Stress ECG: Baseline rhythm is afib  QPS Raw Data Images:  Normal; no motion artifact; normal heart/lung ratio. Stress Images:  Normal homogeneous uptake in all areas of the myocardium. Rest Images:  Normal homogeneous uptake in all areas of the myocardium. Subtraction (SDS):  Normal Transient Ischemic Dilatation (Normal <1.22):  1.11 Lung/Heart Ratio (Normal <0.45):  .34  Quantitative Gated Spect Images QGS EDV:  92 ml QGS ESV:  29 ml QGS cine images:  NL LV Function; NL Wall Motion QGS EF: 68%  Impression Exercise Capacity:  Lexiscan with no exercise. BP Response:  Normal blood pressure response. Clinical Symptoms:  No chest pain. ECG Impression:  No significant ST segment change suggestive of ischemia. Comparison with Prior Nuclear Study: No images to compare  Overall Impression:  Normal stress nuclear study.       Charlton Haws   Please report that the stress test was normal.  Pt is at low risk for his upcoming surgery.  Elyn Aquas.

## 2010-10-17 ENCOUNTER — Telehealth: Payer: Self-pay | Admitting: *Deleted

## 2010-10-17 NOTE — Telephone Encounter (Signed)
Pt called and informed him he was clear for surgery for his knee. All papers faxed to Dr Simonne Come, [phone]

## 2010-10-17 NOTE — Progress Notes (Signed)
Copy send to dr. Elease Hashimoto

## 2010-10-21 NOTE — Progress Notes (Signed)
Nuc med study routed to Dr. Elease Hashimoto.10/21/10 Domenic Polite

## 2010-11-06 ENCOUNTER — Other Ambulatory Visit: Payer: Self-pay | Admitting: Orthopedic Surgery

## 2010-11-06 ENCOUNTER — Ambulatory Visit
Admission: RE | Admit: 2010-11-06 | Discharge: 2010-11-06 | Disposition: A | Discharge status: Home / Self Care | Payer: Medicare Other | Source: Ambulatory Visit | Attending: Orthopedic Surgery | Admitting: Orthopedic Surgery

## 2010-11-06 DIAGNOSIS — Z01811 Encounter for preprocedural respiratory examination: Secondary | ICD-10-CM

## 2010-11-11 ENCOUNTER — Ambulatory Visit (HOSPITAL_BASED_OUTPATIENT_CLINIC_OR_DEPARTMENT_OTHER)
Admission: RE | Admit: 2010-11-11 | Discharge: 2010-11-11 | Disposition: A | Discharge status: Home / Self Care | Payer: Medicare Other | Source: Ambulatory Visit | Attending: Orthopedic Surgery | Admitting: Orthopedic Surgery

## 2010-11-11 DIAGNOSIS — X58XXXA Exposure to other specified factors, initial encounter: Secondary | ICD-10-CM | POA: Insufficient documentation

## 2010-11-11 DIAGNOSIS — S83289A Other tear of lateral meniscus, current injury, unspecified knee, initial encounter: Secondary | ICD-10-CM | POA: Insufficient documentation

## 2010-11-11 DIAGNOSIS — Z01812 Encounter for preprocedural laboratory examination: Secondary | ICD-10-CM | POA: Insufficient documentation

## 2010-11-11 DIAGNOSIS — M1710 Unilateral primary osteoarthritis, unspecified knee: Secondary | ICD-10-CM | POA: Insufficient documentation

## 2010-11-11 LAB — GLUCOSE, CAPILLARY: Glucose-Capillary: 99 mg/dL (ref 70–99)

## 2010-11-11 LAB — POCT I-STAT 4, (NA,K, GLUC, HGB,HCT)
Glucose, Bld: 120 mg/dL — ABNORMAL HIGH (ref 70–99)
Potassium: 3.6 mEq/L (ref 3.5–5.1)
Sodium: 141 mEq/L (ref 135–145)

## 2010-11-15 NOTE — Op Note (Signed)
  NAME:  RAEQWON, LUX NO.:  [id number]  MEDICAL RECORD NO.:  [id number]  LOCATION:                                 FACILITY:  PHYSICIAN:  Marlowe Kays, M.D.  DATE OF BIRTH:  08/31/1940  DATE OF PROCEDURE:  11/11/2010 DATE OF DISCHARGE:                              OPERATIVE REPORT   PREOPERATIVE DIAGNOSES: 1. Torn medial lateral menisci. 2. Osteoarthritis, right knee.  POSTOPERATIVE DIAGNOSES: 1. Torn medial/lateral menisci. 2. Osteoarthritis, right knee.  OPERATION PERFORMED:  Right knee arthroscopy with: 1. Partial medial/lateral meniscectomy. 2. Shaving of medial femoral condyle. 3. Excision of multiple suprapatellar bands.  SURGEON:  Marlowe Kays, MD  ASSISTANT:  Nurse.  ANESTHESIA:  General.  PATHOLOGY AND JUSTIFICATION FOR PROCEDURE:  He has had a painful right knee with giving way and weakness leading to an MRI of September 21, 2010, which demonstrated the preoperative diagnoses.  DESCRIPTION OF PROCEDURE:  Satisfactory general anesthesia, prophylactic antibiotics due to left knee replacement, Ace wrap to left lower extremity, pneumatic tourniquet applied to right lower extremity with leg Esmarched out non-sterilely and the tourniquet inflated to 300 mmHg. Right leg was then prepped from thigh stabilizer to ankle and draped in sterile field.  Time-out performed.  Superior medial saline inflow. First through an anterolateral portal, medial compartment knee joint was evaluated, immediately apparent was severe osteoarthritis involving both sides of the joint with a good bit of residual steroid coating the entire joint.  He did have diffuse maceration and tearing involving basically the whole inner aspect of the medial meniscus.  With a 3.5 shaver, I shaved down the medial femoral condyle until smooth with grade 3 and grade 4 chondromalacia present.  There was no articular cartilage left on the medial tibial plateau.  I also then was able to  shave down the medial meniscus until smooth.  Looking at the medial gutter and suprapatellar area, the patella was warm, but did not require any shaving, but did find multiple suprapatellar bands, which were pictured and resected with the shaver.  On reversing portals, he had a good bit of fraying of the inner border of the lateral meniscus, which I pictured and shaved down until smooth.  The joint surfaces looked good.  ACL was intact.  Knee joint was then irrigated until clear and all fluid possibly removed.  I closed the 2 anterior portals with 4-0 nylon and then injected 20 cc of 0.5% Marcaine with adrenaline, 4 mg of morphine through the inflow apparatus, which was removed and this portal closed with 4-0 nylon as well.  Betadine, Adaptic, and dry sterile dressing were applied.  Tourniquet was released.  He tolerated the procedure well.  At the time of this dictation, was on his way to recovery room in satisfactory condition with no known complications.          ______________________________ Marlowe Kays, M.D.JA/MEDQ  D:  11/11/2010  T:  11/11/2010  Job:  [id number]  Electronically Signed by Marlowe Kays M.D. on 11/15/2010 04:20:55 PM

## 2010-11-18 ENCOUNTER — Ambulatory Visit: Payer: Medicare Other | Attending: Orthopedic Surgery | Admitting: Rehabilitative and Restorative Service Providers"

## 2010-11-18 DIAGNOSIS — IMO0001 Reserved for inherently not codable concepts without codable children: Secondary | ICD-10-CM | POA: Insufficient documentation

## 2010-11-18 DIAGNOSIS — M2550 Pain in unspecified joint: Secondary | ICD-10-CM | POA: Insufficient documentation

## 2010-11-18 DIAGNOSIS — M6281 Muscle weakness (generalized): Secondary | ICD-10-CM | POA: Insufficient documentation

## 2010-11-18 DIAGNOSIS — M25669 Stiffness of unspecified knee, not elsewhere classified: Secondary | ICD-10-CM | POA: Insufficient documentation

## 2010-11-25 ENCOUNTER — Ambulatory Visit: Payer: Medicare Other | Admitting: Physical Therapy

## 2010-11-29 ENCOUNTER — Ambulatory Visit: Payer: Medicare Other | Admitting: Rehabilitative and Restorative Service Providers"

## 2010-12-02 ENCOUNTER — Ambulatory Visit: Payer: Medicare Other | Admitting: Rehabilitative and Restorative Service Providers"

## 2010-12-05 ENCOUNTER — Ambulatory Visit: Payer: Medicare Other | Admitting: Rehabilitative and Restorative Service Providers"

## 2010-12-09 ENCOUNTER — Ambulatory Visit: Payer: Medicare Other | Admitting: Rehabilitative and Restorative Service Providers"

## 2010-12-11 ENCOUNTER — Encounter: Payer: Medicare Other | Admitting: Physical Therapy

## 2010-12-12 ENCOUNTER — Ambulatory Visit: Payer: Medicare Other | Admitting: Physical Therapy

## 2010-12-18 ENCOUNTER — Ambulatory Visit: Payer: Medicare Other | Attending: Orthopedic Surgery | Admitting: Rehabilitative and Restorative Service Providers"

## 2010-12-18 DIAGNOSIS — M25669 Stiffness of unspecified knee, not elsewhere classified: Secondary | ICD-10-CM | POA: Insufficient documentation

## 2010-12-18 DIAGNOSIS — M2550 Pain in unspecified joint: Secondary | ICD-10-CM | POA: Insufficient documentation

## 2010-12-18 DIAGNOSIS — M6281 Muscle weakness (generalized): Secondary | ICD-10-CM | POA: Insufficient documentation

## 2010-12-18 DIAGNOSIS — IMO0001 Reserved for inherently not codable concepts without codable children: Secondary | ICD-10-CM | POA: Insufficient documentation

## 2010-12-20 ENCOUNTER — Ambulatory Visit: Payer: Medicare Other | Admitting: Physical Therapy

## 2010-12-24 ENCOUNTER — Ambulatory Visit: Payer: Medicare Other | Admitting: Rehabilitative and Restorative Service Providers"

## 2010-12-26 ENCOUNTER — Ambulatory Visit: Payer: Medicare Other | Admitting: Rehabilitative and Restorative Service Providers"

## 2010-12-30 ENCOUNTER — Ambulatory Visit: Payer: Medicare Other | Admitting: Rehabilitative and Restorative Service Providers"

## 2011-01-01 ENCOUNTER — Encounter: Payer: Medicare Other | Admitting: Rehabilitative and Restorative Service Providers"

## 2011-01-02 IMAGING — CR DG KNEE 1-2V*L*
2 series · 2 of 2 positions shown · non-contrast
Comparison: None.

CLINICAL DATA: 69-year-old with wound drainage, infection.

LEFT KNEE - 1-2 VIEW

[view not recorded (1 of 2)]
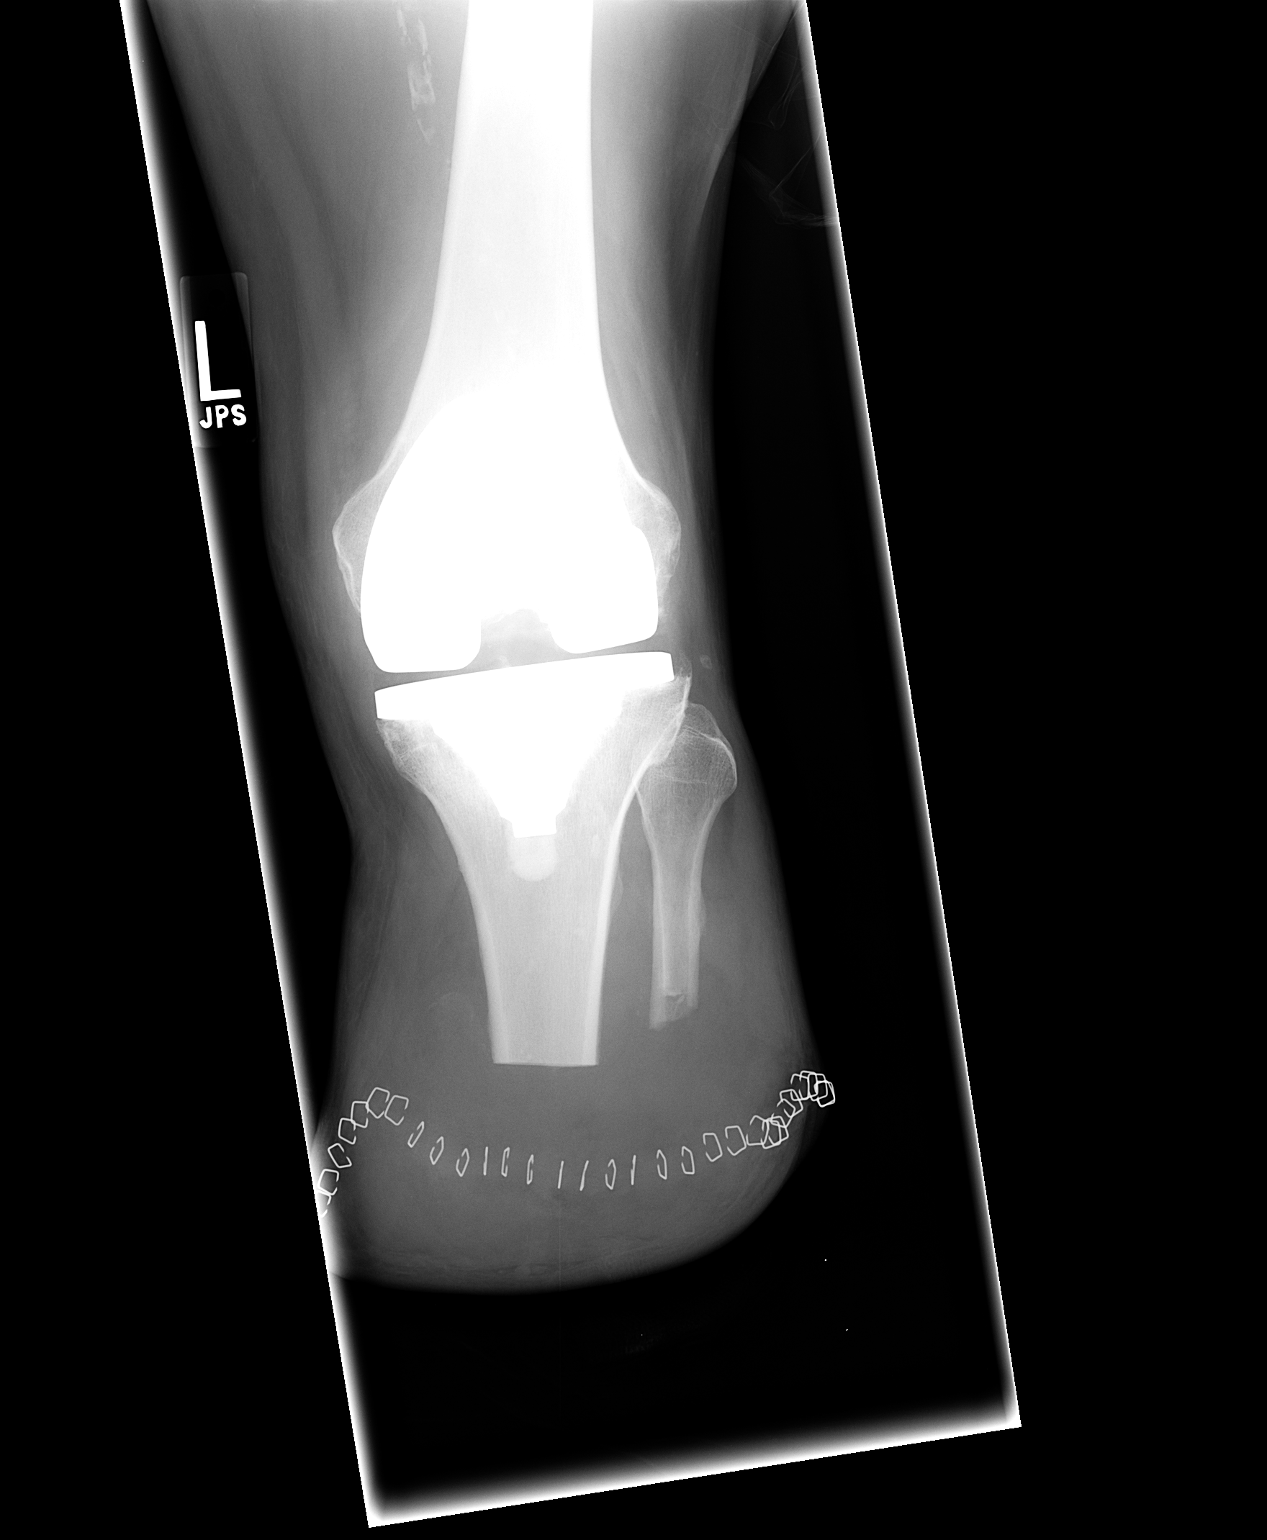

[view not recorded (2 of 2)]
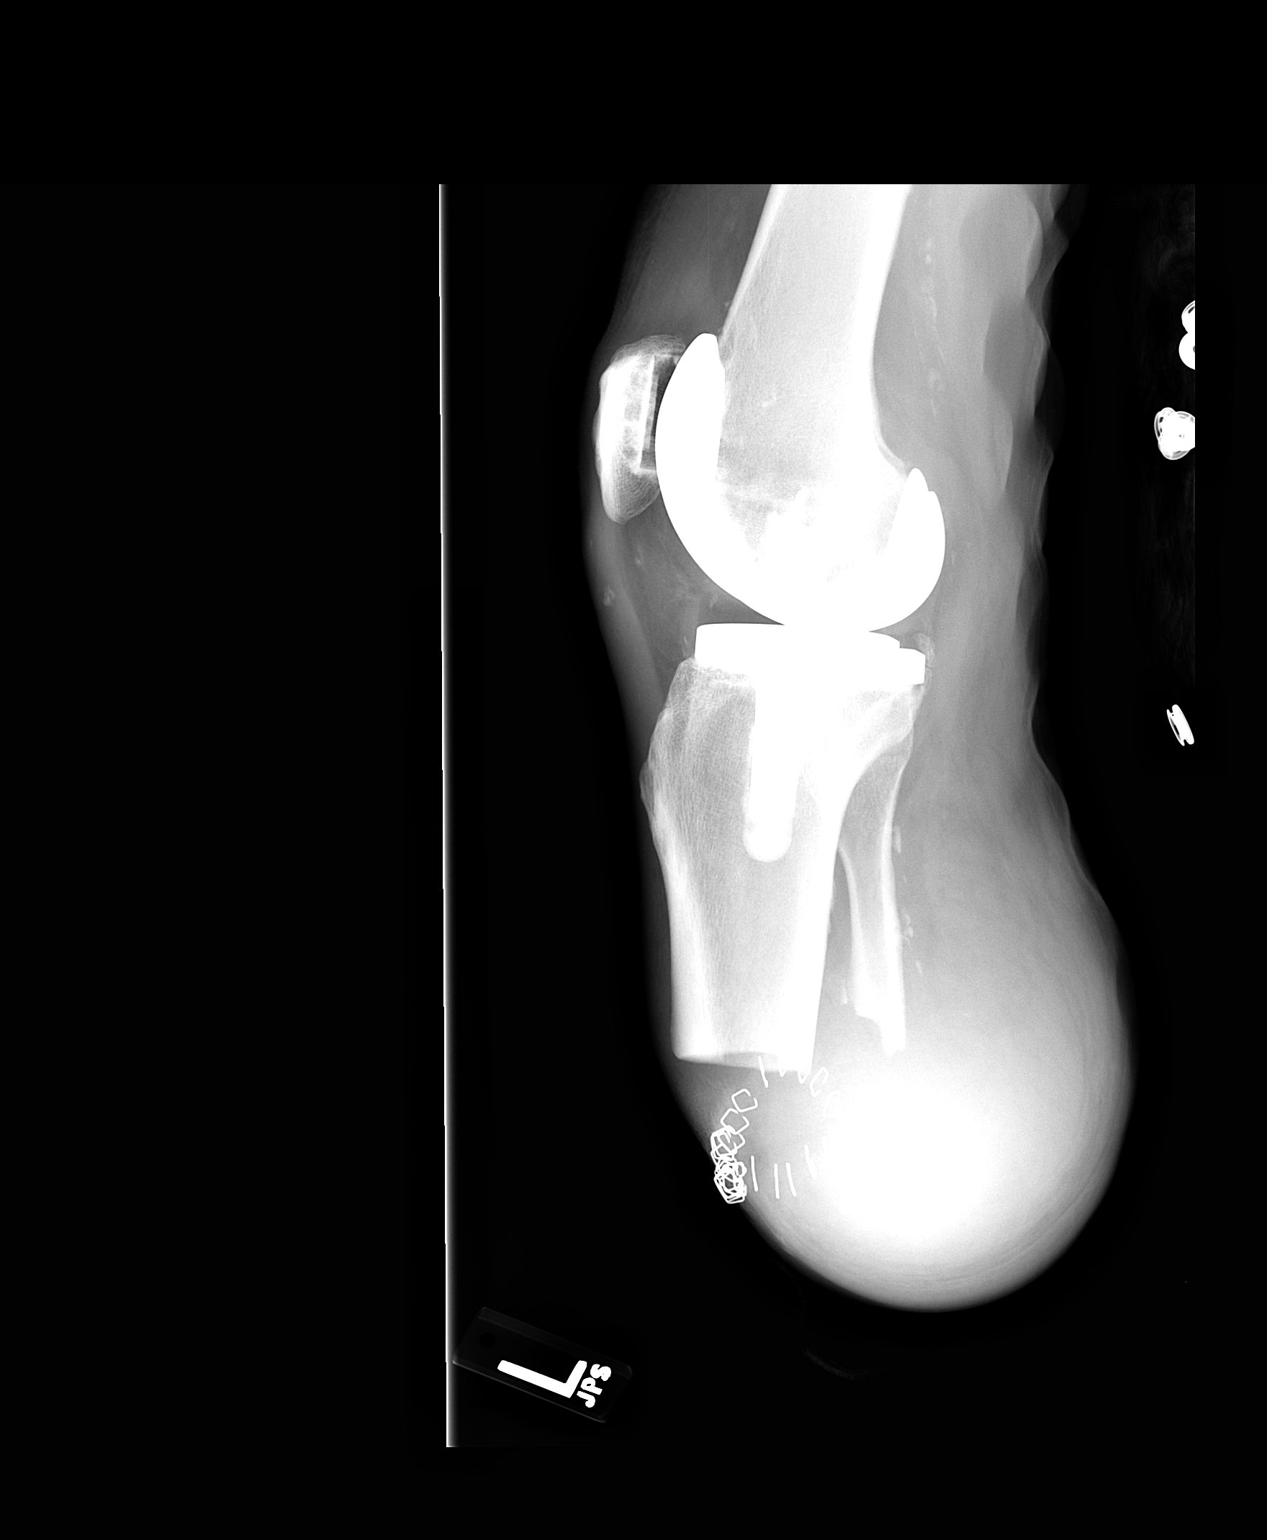

[2 of 2 positions shown; findings below may reference images not displayed]

FINDINGS: The the patient is status post total left knee
replacement and BKA.  Skin staples are seen in the soft tissues
overlying the residual left lower extremity.  There are no
developing lucencies to suggest developing osteomyelitis.  No
definite periosteal reaction is seen.  The soft tissues are
unremarkable with vascular calcifications noted around the knee.
IMPRESSION: Postsurgical changes detailed above without evidence of acute
osteomyelitis.

## 2011-01-06 LAB — DIFFERENTIAL
Eosinophils Absolute: 0.3
Eosinophils Relative: 2
Lymphs Abs: 1
Monocytes Relative: 9

## 2011-01-06 LAB — COMPREHENSIVE METABOLIC PANEL
ALT: 14
ALT: 18
AST: 18
AST: 20
AST: 21
Albumin: 2.9 — ABNORMAL LOW
Alkaline Phosphatase: 131 — ABNORMAL HIGH
Alkaline Phosphatase: 97
CO2: 36 — ABNORMAL HIGH
CO2: 39 — ABNORMAL HIGH
Calcium: 10.1
Calcium: 9.2
Creatinine, Ser: 1.82 — ABNORMAL HIGH
GFR calc Af Amer: 35 — ABNORMAL LOW
GFR calc Af Amer: 45 — ABNORMAL LOW
GFR calc non Af Amer: 37 — ABNORMAL LOW
Glucose, Bld: 289 — ABNORMAL HIGH
Potassium: 2.1 — CL
Potassium: 3.4 — ABNORMAL LOW
Sodium: 132 — ABNORMAL LOW
Sodium: 133 — ABNORMAL LOW
Total Protein: 5.9 — ABNORMAL LOW
Total Protein: 6.7

## 2011-01-06 LAB — BASIC METABOLIC PANEL
BUN: 47 — ABNORMAL HIGH
Chloride: 85 — ABNORMAL LOW
Creatinine, Ser: 2.17 — ABNORMAL HIGH
GFR calc Af Amer: 37 — ABNORMAL LOW
GFR calc non Af Amer: 33 — ABNORMAL LOW
Potassium: 2.4 — CL
Potassium: 3.4 — ABNORMAL LOW
Sodium: 135

## 2011-01-06 LAB — CBC
HCT: 41.7
Hemoglobin: 13.1
MCHC: 31.7
MCV: 75.1 — ABNORMAL LOW
RBC: 5.56
RBC: 6.28 — ABNORMAL HIGH
RDW: 19.7 — ABNORMAL HIGH
WBC: 16.7 — ABNORMAL HIGH

## 2011-01-06 LAB — POCT CARDIAC MARKERS
CKMB, poc: 1.8
CKMB, poc: 1.8
Myoglobin, poc: 201
Troponin i, poc: <0.05
Troponin i, poc: <0.05

## 2011-01-06 LAB — B-NATRIURETIC PEPTIDE (CONVERTED LAB)
Pro B Natriuretic peptide (BNP): 247 — ABNORMAL HIGH
Pro B Natriuretic peptide (BNP): 416 — ABNORMAL HIGH

## 2011-01-06 LAB — I-STAT 8, (EC8 V) (CONVERTED LAB)
Acid-Base Excess: 13 — ABNORMAL HIGH
Bicarbonate: 41.5 — ABNORMAL HIGH
Glucose, Bld: 369 — ABNORMAL HIGH
Hemoglobin: 18.4 — ABNORMAL HIGH
Operator id: 234501
Potassium: 2.3 — CL
Sodium: 127 — ABNORMAL LOW
TCO2: 43

## 2011-01-06 LAB — CK TOTAL AND CKMB (NOT AT ARMC): Relative Index: INVALID

## 2011-01-06 LAB — POCT I-STAT CREATININE: Creatinine, Ser: 2.3 — ABNORMAL HIGH

## 2011-01-06 LAB — TROPONIN I: Troponin I: 0.07 — ABNORMAL HIGH

## 2011-01-06 LAB — AMYLASE: Amylase: 44

## 2011-01-06 LAB — CARDIAC PANEL(CRET KIN+CKTOT+MB+TROPI)
CK, MB: 4.9 — ABNORMAL HIGH
Total CK: 70

## 2011-01-06 LAB — PROTIME-INR: INR: 0.9

## 2011-01-08 ENCOUNTER — Ambulatory Visit: Payer: Medicare Other | Admitting: Rehabilitation

## 2011-01-13 ENCOUNTER — Ambulatory Visit: Payer: Medicare Other | Attending: Orthopedic Surgery | Admitting: Rehabilitative and Restorative Service Providers"

## 2011-01-13 DIAGNOSIS — M25669 Stiffness of unspecified knee, not elsewhere classified: Secondary | ICD-10-CM | POA: Insufficient documentation

## 2011-01-13 DIAGNOSIS — IMO0001 Reserved for inherently not codable concepts without codable children: Secondary | ICD-10-CM | POA: Insufficient documentation

## 2011-01-13 DIAGNOSIS — M6281 Muscle weakness (generalized): Secondary | ICD-10-CM | POA: Insufficient documentation

## 2011-01-13 DIAGNOSIS — M2550 Pain in unspecified joint: Secondary | ICD-10-CM | POA: Insufficient documentation

## 2011-01-15 ENCOUNTER — Ambulatory Visit: Payer: Medicare Other | Admitting: Rehabilitative and Restorative Service Providers"

## 2011-01-21 ENCOUNTER — Ambulatory Visit: Payer: Medicare Other | Admitting: Rehabilitation

## 2011-01-23 ENCOUNTER — Ambulatory Visit: Payer: Medicare Other | Admitting: Rehabilitation

## 2011-01-27 ENCOUNTER — Ambulatory Visit: Payer: Medicare Other | Admitting: Rehabilitative and Restorative Service Providers"

## 2011-01-29 ENCOUNTER — Encounter: Payer: Self-pay | Admitting: Cardiovascular Disease

## 2011-01-30 ENCOUNTER — Ambulatory Visit: Payer: Medicare Other | Admitting: Rehabilitative and Restorative Service Providers"

## 2011-02-03 ENCOUNTER — Encounter: Payer: Medicare Other | Admitting: Rehabilitation

## 2011-02-05 ENCOUNTER — Ambulatory Visit: Payer: Medicare Other | Admitting: Rehabilitative and Restorative Service Providers"

## 2011-02-11 ENCOUNTER — Ambulatory Visit: Payer: Medicare Other | Admitting: Rehabilitative and Restorative Service Providers"

## 2011-02-13 ENCOUNTER — Ambulatory Visit: Payer: Medicare Other | Attending: Orthopedic Surgery | Admitting: Rehabilitative and Restorative Service Providers"

## 2011-02-13 DIAGNOSIS — M6281 Muscle weakness (generalized): Secondary | ICD-10-CM | POA: Insufficient documentation

## 2011-02-13 DIAGNOSIS — M25669 Stiffness of unspecified knee, not elsewhere classified: Secondary | ICD-10-CM | POA: Insufficient documentation

## 2011-02-13 DIAGNOSIS — M2550 Pain in unspecified joint: Secondary | ICD-10-CM | POA: Insufficient documentation

## 2011-02-13 DIAGNOSIS — IMO0001 Reserved for inherently not codable concepts without codable children: Secondary | ICD-10-CM | POA: Insufficient documentation

## 2011-02-18 ENCOUNTER — Ambulatory Visit: Payer: Medicare Other | Admitting: Rehabilitation

## 2011-02-20 ENCOUNTER — Encounter: Payer: Medicare Other | Admitting: Rehabilitative and Restorative Service Providers"

## 2011-03-07 ENCOUNTER — Encounter: Payer: Self-pay | Admitting: Cardiovascular Disease

## 2011-04-14 ENCOUNTER — Ambulatory Visit (INDEPENDENT_AMBULATORY_CARE_PROVIDER_SITE_OTHER): Payer: Medicare Other | Admitting: Vascular Surgery

## 2011-04-14 DIAGNOSIS — M79609 Pain in unspecified limb: Secondary | ICD-10-CM

## 2011-04-14 DIAGNOSIS — I739 Peripheral vascular disease, unspecified: Secondary | ICD-10-CM

## 2011-04-14 DIAGNOSIS — M7989 Other specified soft tissue disorders: Secondary | ICD-10-CM

## 2011-04-16 NOTE — Progress Notes (Signed)
Addended by: Margo Aye A on: 04/16/2011 09:19 AM   Modules accepted: Orders

## 2011-04-17 NOTE — Procedures (Unsigned)
DUPLEX DEEP VENOUS EXAM - LOWER EXTREMITY  INDICATION:  Right lower extremity edema and pain.  HISTORY:  Edema:  Yes. Trauma/Surgery:  History of intervention involving the right knee. Pain:  Yes. PE:  No. Previous DVT:  No. Anticoagulants:  Unknown. Other:  History of left below-knee amputation.  DUPLEX EXAM:               CFV   SFV   PopV  PTV    GSV               R  L  R  L  R  L  R   L  R  L Thrombosis    o     o     o     o      o Spontaneous   +     +     +     +      + Phasic        +     +     +     +      + Augmentation  +     +     +     +      + Compressible  +     +     +     +      + Competent     +     +     +     +      +  Legend:  + - yes  o - no  p - partial  D - decreased  IMPRESSION: 1. No evidence of deep or superficial venous thrombosis identified     involving the right lower extremity. 2. Technically limited and difficult evaluation due to patient's     contracted state of the right lower extremity.      _____________________________ Di Kindle. Edilia Bo, M.D.  SH/MEDQ  D:  04/14/2011  T:  04/14/2011  Job:  [id number]

## 2011-04-21 ENCOUNTER — Encounter: Payer: Self-pay | Admitting: Cardiovascular Disease

## 2011-04-21 ENCOUNTER — Ambulatory Visit (INDEPENDENT_AMBULATORY_CARE_PROVIDER_SITE_OTHER): Payer: Medicare Other | Admitting: Cardiovascular Disease

## 2011-04-21 DIAGNOSIS — I10 Essential (primary) hypertension: Secondary | ICD-10-CM

## 2011-04-21 DIAGNOSIS — I2510 Atherosclerotic heart disease of native coronary artery without angina pectoris: Secondary | ICD-10-CM

## 2011-04-21 DIAGNOSIS — I4891 Unspecified atrial fibrillation: Secondary | ICD-10-CM

## 2011-04-21 NOTE — Assessment & Plan Note (Addendum)
He is currently in sinus rhythm with first degree AV block. He is stable.

## 2011-04-21 NOTE — Assessment & Plan Note (Signed)
His blood pressure is fairly well controlled. He does not want having laboratory. All of his lab work has been done by Dr. Su Hilt. We'll have Dr. Su Hilt fax [place] his most recent labs. I seen in 6 months.

## 2011-04-21 NOTE — Progress Notes (Addendum)
Donald Dudley Date of Birth  09-27-1940 [facility] [address]     [address] [place], [place]  [address] [phone]  Fax  [phone]  History of Present Illness:  Donald Dudley is an elderly gentleman with a history of coronary artery disease. He status post CABG in 1993. He had heart catheterization in 2006 which revealed severe native coronary artery disease. The LIMA to LAD was patent. Left circumflex artery had moderate luminal regularties. The right coronary artery was occluded and the saphenous vein graft to the right coronary artery was occluded. He also has a history of hypertension and diabetes mellitus. He has a history of significant peripheral vascular disease and is status post left below-the-knee amputation.  He does have his left leg imitative instead of undergoing lower extremity angiogram which would put him at risk of needing dialysis. He refuses to go on dialysis because of the  poor quality of life that is associated with dialysis.  His general health has continued to slowly and progressively declined. He's obviously depressed. He now needs to have right knee surgery because of severe arthritis and pain.  He is not able to exercise.  The patient has a history of a fibrillation. We've had him on Coumadin in the past but we stopped it when he had a significant GI bleed.  He continues to have lots of pain in his right knee.    Current Outpatient Prescriptions on File Prior to Visit  Medication Sig Dispense Refill  . atenolol (TENORMIN) 100 MG tablet Take 100 mg by mouth daily.       . Hydrocodone-Acetaminophen (VICODIN PO) Take 650 mg by mouth daily. Taking 6 pills a Day      . hydroxyurea (HYDREA) 500 MG capsule Take 500 mg by mouth 3 (three) times a week. 5 times weekly or as directed --  May take with food to minimize GI side effects.      . insulin NPH-insulin regular (NOVOLIN 70/30) (70-30) 100 UNIT/ML injection Inject into the  skin as directed.        . isosorbide mononitrate (IMDUR) 30 MG 24 hr tablet Take 30 mg by mouth daily.        . Tamsulosin HCl (FLOMAX) 0.4 MG CAPS Take 0.4 mg by mouth daily.        Marland Kitchen torsemide (DEMADEX) 20 MG tablet Take 20 mg by mouth daily.       Marland Kitchen triamterene-hydrochlorothiazide (MAXZIDE) 75-50 MG per tablet Take 1 tablet by mouth daily.        Marland Kitchen zolpidem (AMBIEN) 10 MG tablet Take 10 mg by mouth at bedtime as needed.          Allergies  Allergen Reactions  . Amiodarone   . Colchicine   . Crestor (Rosuvastatin Calcium)   . Klor-Con   . Lipitor (Atorvastatin Calcium)   . Lisinopril   . Lyrica (Pregabalin) Swelling  . Mevacor (Lovastatin)     GI-bleed   . Micardis (Telmisartan)   . Neurontin (Gabapentin)   . Norvasc (Amlodipine Besylate)   . Ranexa   . Zocor (Simvastatin - High Dose)     Past Medical History  Diagnosis Date  . Diabetes mellitus   . Hypertension   . MI (myocardial infarction)   . S/P BKA (below knee amputation)   . History of cholecystectomy   . Hx of CABG   . PVD (peripheral vascular disease)   . Peripheral neuropathy   . Dyslipidemia   .  Arthritis   . Atrial fibrillation   . Hyperlipidemia   . Hypercholesterolemia   . Chest pain   . CAD (coronary artery disease)     CABG - 1993  /  PTCA - 2004  /  Cath - 2006  . Gout   . Chronic kidney disease, stage III (moderate)   . CLL (chronic lymphocytic leukemia)   . CHF (congestive heart failure)   . Bilateral renal artery stenosis     status post stents  . Polycythemia     Past Surgical History  Procedure Date  . Coronary angioplasty with stent placement 2004    second diagonal artery -- [name], M.D.   . Cardiac catheterization 2006    Est. EF of 65% -- Diffuse coronary artery disease.  The second diagonal artery that was angioplastied in 2004 is now occluded.  It is no longer a candidate for PTCA since it is now flush occluded.  Normal LV systolic function.  [name], M.D.    . Renal artery stent 2003    Bilateral renal artery stenosis  . Total knee arthroplasty     left  . Coronary artery bypass graft 1993    left internal mammary artery to his LAD artery --   . Coronary artery bypass graft 1994    vein graft failure of in his right coronary artery  . Distal clavicle excision 2003    SURGEON:  [name], M.D.  . Tendon repair     Left Achilles repair x2    History  Smoking status  . Former Smoker  . Types: Cigarettes  . Quit date: 04/14/1976  Smokeless tobacco  . Not on file    History  Alcohol Use No    Family History  Problem Relation Age of Onset  . Coronary artery disease Father   . Coronary artery disease Mother   . Diabetes      siblings  . Hypertension Daughter   . Heart failure Mother   . Dementia Mother     Reviw of Systems:  Reviewed in the HPI.  All other systems are negative.  Physical Exam: BP 134/45  Pulse 64  Ht 5\' 8"  (1.727 m)  Wt 163 lb 1.9 oz (73.991 kg)  BMI 24.80 kg/m2 The patient is alert and oriented x 3.  He is depressed.  Skin: warm and dry.  Color is somewhat ashen.   HEENT:   the sclera are nonicteric.  The mucous membranes are moist.  The carotids are 2+ without bruits.  There is no thyromegaly.  There is no JVD.    Lungs: clear.  The chest wall is non tender.    Heart:Regular rate with a normal S1 and S2.  There are no murmurs, gallops, or rubs. The PMI is not displaced.     Abdomin: good bowel sounds.  There is no guarding or rebound.  There is no hepatosplenomegaly or tenderness.  There are no masses.   Extremities:  no clubbing, cyanosis, or edema. He status post left above-the-knee amputation.  Neuro:  Cranial nerves II - XII are intact.  Motor and sensory functions are intact.    He was examined in wheelchair.  ECG: Normal sinus rhythm with a first-degree AV block. He has an incomplete right bundle branch block  Assessment / Plan:

## 2011-04-21 NOTE — Patient Instructions (Signed)
Your physician wants you to follow-up in: 6 months  You will receive a reminder letter in the mail two months in advance. If you don't receive a letter, please call our office to schedule the follow-up appointment.  Your physician recommends that you continue on your current medications as directed. Please refer to the Current Medication list given to you today.  

## 2011-09-02 ENCOUNTER — Telehealth: Payer: Self-pay | Admitting: Vascular Surgery

## 2011-09-02 ENCOUNTER — Other Ambulatory Visit: Payer: Self-pay | Admitting: Vascular Surgery

## 2011-09-02 ENCOUNTER — Encounter: Payer: Self-pay | Admitting: Vascular Surgery

## 2011-09-02 ENCOUNTER — Ambulatory Visit (INDEPENDENT_AMBULATORY_CARE_PROVIDER_SITE_OTHER): Payer: Medicare Other | Admitting: Vascular Surgery

## 2011-09-02 VITALS — BP 201/79 | HR 62 | Temp 97.3°F | Resp 20 | Ht 68.0 in | Wt 165.0 lb

## 2011-09-02 DIAGNOSIS — I739 Peripheral vascular disease, unspecified: Secondary | ICD-10-CM | POA: Insufficient documentation

## 2011-09-02 DIAGNOSIS — I7025 Atherosclerosis of native arteries of other extremities with ulceration: Secondary | ICD-10-CM | POA: Insufficient documentation

## 2011-09-02 DIAGNOSIS — L98499 Non-pressure chronic ulcer of skin of other sites with unspecified severity: Secondary | ICD-10-CM

## 2011-09-02 NOTE — Progress Notes (Signed)
Subjective:     Patient ID: Donald Dudley, male   DOB: 29-Jun-1940, 71 y.o.   MRN: [id number]  HPI this 71 year old male patient had a left below-knee dictation performed Dr. Darrick Penna in July of 2011. In August of 2011 he had a revision to a more proximal site. Patient has done well since that time and has a below knee prosthesis. He also has severe degenerative joint disease bilaterally and has had a left total knee replacement and is being evaluated for a similar procedure on the right side. 2 weeks ago he noted some clear drainage from the anterior aspect of his left below-knee stump he has had no chills and fever. He did start himself on Keflex 5 days ago and that was extended for another 10 days by his medical doctor Dr. Su Hilt.  Past Medical History  Diagnosis Date  . Diabetes mellitus   . Hypertension   . MI (myocardial infarction)   . S/P BKA (below knee amputation)   . History of cholecystectomy   . Hx of CABG   . PVD (peripheral vascular disease)   . Peripheral neuropathy   . Dyslipidemia   . Arthritis   . Atrial fibrillation   . Hyperlipidemia   . Hypercholesterolemia   . Chest pain   . CAD (coronary artery disease)     CABG - 1993  /  PTCA - 2004  /  Cath - 2006  . Gout   . Chronic kidney disease, stage III (moderate)   . CLL (chronic lymphocytic leukemia)   . CHF (congestive heart failure)   . Bilateral renal artery stenosis     status post stents  . Polycythemia     History  Substance Use Topics  . Smoking status: Former Smoker -- 25 years    Types: Cigarettes    Quit date: 04/14/1976  . Smokeless tobacco: Never Used  . Alcohol Use: No    Family History  Problem Relation Age of Onset  . Coronary artery disease Father   . Coronary artery disease Mother   . Diabetes      siblings  . Hypertension Daughter   . Heart failure Mother   . Dementia Mother     Allergies  Allergen Reactions  . Amiodarone   . Colchicine   . Crestor (Rosuvastatin Calcium)     . Lipitor (Atorvastatin Calcium)   . Lisinopril   . Lyrica (Pregabalin) Swelling  . Mevacor (Lovastatin)     GI-bleed   . Micardis (Telmisartan)   . Neurontin (Gabapentin)   . Norvasc (Amlodipine Besylate)   . Potassium Chloride Er   . Ranolazine Er   . Zocor (Simvastatin - High Dose)     Current outpatient prescriptions:ALLOPURINOL PO, Take 100 mg by mouth daily.  , Disp: , Rfl: ;  ALPRAZolam (XANAX) 0.25 MG tablet, Take 0.25 mg by mouth at bedtime as needed.  , Disp: , Rfl: ;  atenolol (TENORMIN) 100 MG tablet, Take 100 mg by mouth daily. , Disp: , Rfl: ;  Casanthranol-Docusate Sodium 30-100 MG CAPS, Take by mouth. Stool Softner , Disp: , Rfl:  Hydrocodone-Acetaminophen (VICODIN PO), Take 650 mg by mouth daily. Taking 6 pills a Day, Disp: , Rfl: ;  hydroxyurea (HYDREA) 500 MG capsule, Take 500 mg by mouth 3 (three) times a week. 5 times weekly or as directed --  May take with food to minimize GI side effects., Disp: , Rfl: ;  insulin NPH-insulin regular (NOVOLIN 70/30) (70-30) 100 UNIT/ML injection, Inject  into the skin as directed.  , Disp: , Rfl:  isosorbide mononitrate (IMDUR) 30 MG 24 hr tablet, Take 30 mg by mouth daily.  , Disp: , Rfl: ;  Tamsulosin HCl (FLOMAX) 0.4 MG CAPS, Take 0.4 mg by mouth daily.  , Disp: , Rfl: ;  torsemide (DEMADEX) 20 MG tablet, Take 20 mg by mouth daily. , Disp: , Rfl: ;  triamterene-hydrochlorothiazide (MAXZIDE) 75-50 MG per tablet, Take 1 tablet by mouth daily.  , Disp: , Rfl:  zolpidem (AMBIEN) 10 MG tablet, Take 10 mg by mouth at bedtime as needed.  , Disp: , Rfl:   BP 201/79  Pulse 62  Temp 97.3 F (36.3 C)  Resp 20  Ht 5\' 8"  (1.727 m)  Wt 165 lb (74.844 kg)  BMI 25.09 kg/m2  Body mass index is 25.09 kg/(m^2).         Review of Systems denies chest pain, dyspnea on exertion, PND, orthopnea, hemoptysis. Complains of severe pain in right knee    Objective:   Physical Exam blood pressure 201/79 heart rate 62 respirations 14 General  well-developed well-nourished male in no apparent distress alert and oriented x3 Chest no rhonchi or wheezing Left lower extremity has a below-knee amputation stump which is well-healed. Proximal to the well-healed incision there is a circular area about 3-4 cm in diameter which is slightly fluctuant. He was able to express some yellowish clear fluid which is nonpurulent from this area. This was cultured. This was not foul-smelling.     Assessment:     Possible bursa-sac anterior aspect of below-knee dictation stump-possibly infected-possibly caused by prosthesis    Plan:     Fluid was cultured today and he was advised not to use the prosthesis He will return to see Dr. Darrick Penna later this week for further evaluation and planning for this problem May need further proximal revision but limited by the fact that he has total knee replacement

## 2011-09-02 NOTE — Telephone Encounter (Signed)
Per Okey Regal, I scheduled an appointment for Donald Dudley on Thursday to see Dr. Darrick Penna. When I notified Crystal with appointment information, she stated that the nurse wanted Donald Dudley to be seen sooner. The patient has an infected stump and is in pain. I scheduled the patient for the next available appointment which is with Dr. Hart Rochester, today at 3:00.

## 2011-09-03 ENCOUNTER — Encounter: Payer: Self-pay | Admitting: Vascular Surgery

## 2011-09-04 ENCOUNTER — Other Ambulatory Visit: Payer: Self-pay | Admitting: *Deleted

## 2011-09-04 ENCOUNTER — Encounter: Payer: Self-pay | Admitting: *Deleted

## 2011-09-04 ENCOUNTER — Ambulatory Visit (INDEPENDENT_AMBULATORY_CARE_PROVIDER_SITE_OTHER): Payer: Medicare Other | Admitting: Vascular Surgery

## 2011-09-04 ENCOUNTER — Encounter: Payer: Self-pay | Admitting: Vascular Surgery

## 2011-09-04 ENCOUNTER — Ambulatory Visit: Payer: Medicare Other | Admitting: Vascular Surgery

## 2011-09-04 VITALS — BP 150/85 | HR 53 | Temp 97.6°F | Ht 68.0 in | Wt 163.0 lb

## 2011-09-04 DIAGNOSIS — I739 Peripheral vascular disease, unspecified: Secondary | ICD-10-CM

## 2011-09-04 DIAGNOSIS — L98499 Non-pressure chronic ulcer of skin of other sites with unspecified severity: Secondary | ICD-10-CM

## 2011-09-04 NOTE — Progress Notes (Signed)
Patient is a 71-year-old male seen several days ago for drainage from his left below-knee amputation by my partner Dr. Lawson. He returns today for followup. As per Dr. Lawson's note he is been having some drainage from the anterior aspect of his left below-knee amputation. This has been clear in nature. He is been on Keflex for approximately 5 days with no symptomatic relief. He complains of considerable pain in the area.  He has no relief of the pain with Vicodin. He denies fever or chills.  Past Medical History  Diagnosis Date  . Diabetes mellitus   . Hypertension   . MI (myocardial infarction)   . S/P BKA (below knee amputation)   . History of cholecystectomy   . Hx of CABG   . PVD (peripheral vascular disease)   . Peripheral neuropathy   . Dyslipidemia   . Arthritis   . Atrial fibrillation   . Hyperlipidemia   . Hypercholesterolemia   . Chest pain   . CAD (coronary artery disease)     CABG - 1993  /  PTCA - 2004  /  Cath - 2006  . Gout   . Chronic kidney disease, stage III (moderate)   . CLL (chronic lymphocytic leukemia)   . CHF (congestive heart failure)   . Bilateral renal artery stenosis     status post stents  . Polycythemia    Past Surgical History  Procedure Date  . Coronary angioplasty with stent placement 2004    second diagonal artery -- [name], M.D.   . Cardiac catheterization 2006    Est. EF of 65% -- Diffuse coronary artery disease.  The second diagonal artery that was angioplastied in 2004 is now occluded.  It is no longer a candidate for PTCA since it is now flush occluded.  Normal LV systolic function.  Philip J. Nahser, M.D.  . Renal artery stent 2003    Bilateral renal artery stenosis  . Total knee arthroplasty     left  . Coronary artery bypass graft 1993    left internal mammary artery to his LAD artery --   . Coronary artery bypass graft 1994    vein graft failure of in his right coronary artery  . Distal clavicle excision 2003   SURGEON:  Philips J. Carter, M.D.  . Tendon repair     Left Achilles repair x2   History   Social History  . Marital Status: Married    Spouse Name: N/A    Number of Children: N/A  . Years of Education: N/A   Occupational History  . Not on file.   Social History Main Topics  . Smoking status: Former Smoker -- 25 years    Types: Cigarettes    Quit date: 04/14/1976  . Smokeless tobacco: Never Used  . Alcohol Use: No  . Drug Use: No  . Sexually Active: Not on file   Other Topics Concern  . Not on file   Social History Narrative  . No narrative on file    Physical exam: Filed Vitals:   09/04/11 1421  BP: 150/85  Pulse: 53  Temp: 97.6 F (36.4 C)  TempSrc: Oral  Height: 5' 8" (1.727 m)  Weight: 163 lb (73.936 kg)   2 cm area of erythema over the tibial portion of his below-knee dictation. There is clear yellow drainage from this from a less than 1 mm opening  Assessment: Possible infection left below-knee amputation  Plan: Incision and drainage of this area   in the operating room next week. The patient was given a prescription for oxycodone 10 mg #40 dispensed today.  Lisaann Atha, MD [facility] Office: [phone] Pager: [phone]  

## 2011-09-06 LAB — WOUND CULTURE: Gram Stain: NONE SEEN

## 2011-09-09 ENCOUNTER — Encounter (HOSPITAL_COMMUNITY): Payer: Self-pay | Admitting: *Deleted

## 2011-09-09 MED ORDER — SODIUM CHLORIDE 0.9 % IV SOLN: INTRAVENOUS | Status: DC

## 2011-09-09 MED ORDER — DEXTROSE 5 % IV SOLN
1.5000 g | INTRAVENOUS | Status: AC
  Administered 2011-09-10: 1.5 g via INTRAVENOUS
  Filled 2011-09-09: qty 1.5

## 2011-09-09 NOTE — Progress Notes (Addendum)
Dr.Nahser is cardiologist-last office visit in epic from 04/21/11 Echo in epic from 2007 Heart cath in epic from 2006   Medical Md is Dr.Ronald Su Hilt

## 2011-09-10 ENCOUNTER — Encounter (HOSPITAL_COMMUNITY)
Admission: RE | Disposition: A | Discharge status: Skilled Nursing Facility | Payer: Self-pay | Source: Ambulatory Visit | Attending: Vascular Surgery

## 2011-09-10 ENCOUNTER — Encounter (HOSPITAL_COMMUNITY): Payer: Self-pay | Admitting: Vascular Surgery

## 2011-09-10 ENCOUNTER — Ambulatory Visit (HOSPITAL_COMMUNITY): Payer: Medicare Other | Admitting: Vascular Surgery

## 2011-09-10 ENCOUNTER — Inpatient Hospital Stay (HOSPITAL_COMMUNITY)
Admission: RE | Admit: 2011-09-10 | Discharge: 2011-09-15 | DRG: 920 | Disposition: A | Discharge status: Skilled Nursing Facility | Payer: Medicare Other | Source: Ambulatory Visit | Attending: Vascular Surgery | Admitting: Vascular Surgery

## 2011-09-10 DIAGNOSIS — I252 Old myocardial infarction: Secondary | ICD-10-CM

## 2011-09-10 DIAGNOSIS — Y849 Medical procedure, unspecified as the cause of abnormal reaction of the patient, or of later complication, without mention of misadventure at the time of the procedure: Secondary | ICD-10-CM | POA: Diagnosis present

## 2011-09-10 DIAGNOSIS — I2510 Atherosclerotic heart disease of native coronary artery without angina pectoris: Secondary | ICD-10-CM | POA: Diagnosis present

## 2011-09-10 DIAGNOSIS — L089 Local infection of the skin and subcutaneous tissue, unspecified: Secondary | ICD-10-CM

## 2011-09-10 DIAGNOSIS — I739 Peripheral vascular disease, unspecified: Secondary | ICD-10-CM | POA: Diagnosis present

## 2011-09-10 DIAGNOSIS — T8189XA Other complications of procedures, not elsewhere classified, initial encounter: Principal | ICD-10-CM | POA: Diagnosis present

## 2011-09-10 DIAGNOSIS — I4891 Unspecified atrial fibrillation: Secondary | ICD-10-CM | POA: Diagnosis present

## 2011-09-10 DIAGNOSIS — Z951 Presence of aortocoronary bypass graft: Secondary | ICD-10-CM

## 2011-09-10 DIAGNOSIS — T827XXA Infection and inflammatory reaction due to other cardiac and vascular devices, implants and grafts, initial encounter: Secondary | ICD-10-CM

## 2011-09-10 DIAGNOSIS — L97909 Non-pressure chronic ulcer of unspecified part of unspecified lower leg with unspecified severity: Secondary | ICD-10-CM | POA: Diagnosis present

## 2011-09-10 DIAGNOSIS — L98499 Non-pressure chronic ulcer of skin of other sites with unspecified severity: Secondary | ICD-10-CM | POA: Diagnosis present

## 2011-09-10 DIAGNOSIS — E785 Hyperlipidemia, unspecified: Secondary | ICD-10-CM | POA: Diagnosis present

## 2011-09-10 DIAGNOSIS — M109 Gout, unspecified: Secondary | ICD-10-CM | POA: Diagnosis present

## 2011-09-10 DIAGNOSIS — E119 Type 2 diabetes mellitus without complications: Secondary | ICD-10-CM | POA: Diagnosis present

## 2011-09-10 DIAGNOSIS — I10 Essential (primary) hypertension: Secondary | ICD-10-CM | POA: Diagnosis present

## 2011-09-10 HISTORY — DX: Frequency of micturition: R35.0

## 2011-09-10 HISTORY — PX: I & D EXTREMITY: SHX5045

## 2011-09-10 HISTORY — DX: Localized edema: R60.0

## 2011-09-10 HISTORY — DX: Anxiety disorder, unspecified: F41.9

## 2011-09-10 HISTORY — DX: Benign prostatic hyperplasia without lower urinary tract symptoms: N40.0

## 2011-09-10 HISTORY — DX: Edema, unspecified: R60.9

## 2011-09-10 HISTORY — DX: Insomnia, unspecified: G47.00

## 2011-09-10 HISTORY — DX: Spontaneous ecchymoses: R23.3

## 2011-09-10 HISTORY — DX: Other skin changes: R23.8

## 2011-09-10 LAB — CBC
HCT: 49.6 % (ref 39.0–52.0)
Hemoglobin: 13.4 g/dL (ref 13.0–17.0)
MCH: 21 pg — ABNORMAL LOW (ref 26.0–34.0)
MCHC: 27.9 g/dL — ABNORMAL LOW (ref 30.0–36.0)
MCV: 77.6 fL — ABNORMAL LOW (ref 78.0–100.0)
RBC: 6.39 MIL/uL — ABNORMAL HIGH (ref 4.22–5.81)
RDW: 19.4 % — ABNORMAL HIGH (ref 11.5–15.5)
WBC: 28.9 10*3/uL — ABNORMAL HIGH (ref 4.0–10.5)
WBC: 25.6 10*3/uL — ABNORMAL HIGH (ref 4.0–10.5)

## 2011-09-10 LAB — COMPREHENSIVE METABOLIC PANEL
ALT: 12 U/L (ref 0–53)
AST: 28 U/L (ref 0–37)
Albumin: 3.9 g/dL (ref 3.5–5.2)
Alkaline Phosphatase: 123 U/L — ABNORMAL HIGH (ref 39–117)
BUN: 53 mg/dL — ABNORMAL HIGH (ref 6–23)
CO2: 33 mEq/L — ABNORMAL HIGH (ref 19–32)
Calcium: 9.6 mg/dL (ref 8.4–10.5)
Chloride: 93 mEq/L — ABNORMAL LOW (ref 96–112)
Creatinine, Ser: 1.81 mg/dL — ABNORMAL HIGH (ref 0.50–1.35)
GFR calc Af Amer: 41 mL/min — ABNORMAL LOW (ref >90)
GFR calc non Af Amer: 35 mL/min — ABNORMAL LOW (ref >90)
Glucose, Bld: 84 mg/dL (ref 70–99)
Potassium: 3.8 mEq/L (ref 3.5–5.1)
Sodium: 140 mEq/L (ref 135–145)
Total Bilirubin: 2.2 mg/dL — ABNORMAL HIGH (ref 0.3–1.2)
Total Protein: 6.6 g/dL (ref 6.0–8.3)

## 2011-09-10 LAB — URINALYSIS, ROUTINE W REFLEX MICROSCOPIC
Bilirubin Urine: NEGATIVE
Bilirubin Urine: NEGATIVE
Glucose, UA: NEGATIVE mg/dL
Hgb urine dipstick: NEGATIVE
Ketones, ur: NEGATIVE mg/dL
Ketones, ur: 15 mg/dL — AB
Leukocytes, UA: NEGATIVE
Leukocytes, UA: NEGATIVE
Nitrite: NEGATIVE
Protein, ur: NEGATIVE mg/dL
pH: 6 (ref 5.0–8.0)
pH: 6.5 (ref 5.0–8.0)

## 2011-09-10 LAB — PROTIME-INR
INR: 1.18 (ref 0.00–1.49)
INR: 1.23 (ref 0.00–1.49)
Prothrombin Time: 15.2 seconds (ref 11.6–15.2)

## 2011-09-10 LAB — GLUCOSE, CAPILLARY
Glucose-Capillary: 102 mg/dL — ABNORMAL HIGH (ref 70–99)
Glucose-Capillary: 86 mg/dL (ref 70–99)
Glucose-Capillary: 92 mg/dL (ref 70–99)

## 2011-09-10 LAB — APTT: aPTT: 33 seconds (ref 24–37)

## 2011-09-10 SURGERY — IRRIGATION AND DEBRIDEMENT EXTREMITY
Anesthesia: General | Site: Leg Lower | Laterality: Left | Wound class: Dirty or Infected

## 2011-09-10 MED ORDER — HYDROXYUREA 500 MG PO CAPS: 500.0000 mg | ORAL_CAPSULE | ORAL | Status: DC

## 2011-09-10 MED ORDER — ALUM & MAG HYDROXIDE-SIMETH 200-200-20 MG/5ML PO SUSP
15.0000 mL | ORAL | Status: DC | PRN
  Filled 2011-09-10: qty 30

## 2011-09-10 MED ORDER — PANTOPRAZOLE SODIUM 40 MG PO TBEC
40.0000 mg | DELAYED_RELEASE_TABLET | Freq: Every day | ORAL | Status: DC
  Administered 2011-09-10 – 2011-09-15 (×6): 40 mg via ORAL
  Filled 2011-09-10 (×7): qty 1

## 2011-09-10 MED ORDER — PHENOL 1.4 % MT LIQD
1.0000 | OROMUCOSAL | Status: DC | PRN
  Filled 2011-09-10: qty 177

## 2011-09-10 MED ORDER — MORPHINE SULFATE 2 MG/ML IJ SOLN
2.0000 mg | INTRAMUSCULAR | Status: DC | PRN
  Administered 2011-09-10: 2 mg via INTRAVENOUS
  Administered 2011-09-10 – 2011-09-11 (×5): 4 mg via INTRAVENOUS
  Administered 2011-09-11 (×3): 2 mg via INTRAVENOUS
  Administered 2011-09-11: 4 mg via INTRAVENOUS
  Administered 2011-09-12 (×2): 2 mg via INTRAVENOUS
  Administered 2011-09-12: 4 mg via INTRAVENOUS
  Administered 2011-09-12: 2 mg via INTRAVENOUS
  Administered 2011-09-12: 4 mg via INTRAVENOUS
  Administered 2011-09-12 – 2011-09-15 (×17): 2 mg via INTRAVENOUS
  Filled 2011-09-10: qty 1
  Filled 2011-09-10: qty 2
  Filled 2011-09-10: qty 1
  Filled 2011-09-10: qty 2
  Filled 2011-09-10 (×2): qty 1
  Filled 2011-09-10: qty 2
  Filled 2011-09-10 (×10): qty 1
  Filled 2011-09-10: qty 2
  Filled 2011-09-10 (×4): qty 1
  Filled 2011-09-10: qty 2
  Filled 2011-09-10 (×6): qty 1
  Filled 2011-09-10 (×2): qty 2
  Filled 2011-09-10 (×2): qty 1

## 2011-09-10 MED ORDER — ONDANSETRON HCL 4 MG/2ML IJ SOLN
INTRAMUSCULAR | Status: DC | PRN
  Administered 2011-09-10: 4 mg via INTRAVENOUS

## 2011-09-10 MED ORDER — TRIAMTERENE-HCTZ 75-50 MG PO TABS
1.0000 | ORAL_TABLET | Freq: Every day | ORAL | Status: DC
  Administered 2011-09-11 – 2011-09-15 (×5): 1 via ORAL
  Filled 2011-09-10 (×6): qty 1

## 2011-09-10 MED ORDER — LACTATED RINGERS IV SOLN
INTRAVENOUS | Status: DC
  Administered 2011-09-10 – 2011-09-15 (×6): via INTRAVENOUS

## 2011-09-10 MED ORDER — POTASSIUM CHLORIDE CRYS ER 20 MEQ PO TBCR
20.0000 meq | EXTENDED_RELEASE_TABLET | Freq: Once | ORAL | Status: AC
  Administered 2011-09-10: 20 meq via ORAL
  Filled 2011-09-10: qty 1

## 2011-09-10 MED ORDER — FENTANYL CITRATE 0.05 MG/ML IJ SOLN
INTRAMUSCULAR | Status: DC | PRN
  Administered 2011-09-10: 50 ug via INTRAVENOUS
  Administered 2011-09-10: 100 ug via INTRAVENOUS

## 2011-09-10 MED ORDER — ALPRAZOLAM 0.25 MG PO TABS
0.2500 mg | ORAL_TABLET | Freq: Every evening | ORAL | Status: DC | PRN
  Administered 2011-09-12 – 2011-09-14 (×4): 0.25 mg via ORAL
  Filled 2011-09-10 (×4): qty 1

## 2011-09-10 MED ORDER — METOPROLOL TARTRATE 1 MG/ML IV SOLN
2.0000 mg | INTRAVENOUS | Status: DC | PRN
  Filled 2011-09-10: qty 5

## 2011-09-10 MED ORDER — HYDROMORPHONE HCL PF 1 MG/ML IJ SOLN
0.2500 mg | INTRAMUSCULAR | Status: DC | PRN
  Administered 2011-09-10 (×2): 0.5 mg via INTRAVENOUS

## 2011-09-10 MED ORDER — ALLOPURINOL 100 MG PO TABS
100.0000 mg | ORAL_TABLET | Freq: Every day | ORAL | Status: DC
  Administered 2011-09-11 – 2011-09-15 (×5): 100 mg via ORAL
  Filled 2011-09-10 (×5): qty 1

## 2011-09-10 MED ORDER — LIDOCAINE HCL 1 % IJ SOLN
INTRAMUSCULAR | Status: DC | PRN
  Administered 2011-09-10: 40 mg via INTRADERMAL

## 2011-09-10 MED ORDER — MIDAZOLAM HCL 5 MG/5ML IJ SOLN
INTRAMUSCULAR | Status: DC | PRN
  Administered 2011-09-10: 2 mg via INTRAVENOUS

## 2011-09-10 MED ORDER — HYDROCODONE-ACETAMINOPHEN 5-325 MG PO TABS
ORAL_TABLET | ORAL | Status: AC
  Filled 2011-09-10: qty 1

## 2011-09-10 MED ORDER — ISOSORBIDE MONONITRATE ER 30 MG PO TB24
30.0000 mg | ORAL_TABLET | Freq: Every day | ORAL | Status: DC
  Administered 2011-09-11 – 2011-09-15 (×5): 30 mg via ORAL
  Filled 2011-09-10 (×5): qty 1

## 2011-09-10 MED ORDER — TORSEMIDE 20 MG PO TABS
20.0000 mg | ORAL_TABLET | Freq: Every day | ORAL | Status: DC
  Administered 2011-09-12 – 2011-09-14 (×3): 20 mg via ORAL
  Filled 2011-09-10 (×6): qty 1

## 2011-09-10 MED ORDER — PROMETHAZINE HCL 25 MG/ML IJ SOLN: 6.2500 mg | INTRAMUSCULAR | Status: DC | PRN

## 2011-09-10 MED ORDER — ACETAMINOPHEN 325 MG PO TABS
325.0000 mg | ORAL_TABLET | ORAL | Status: DC | PRN
  Administered 2011-09-15: 650 mg via ORAL
  Filled 2011-09-10: qty 2

## 2011-09-10 MED ORDER — OXYCODONE-ACETAMINOPHEN 5-325 MG PO TABS: 1.0000 | ORAL_TABLET | ORAL | Status: DC | PRN

## 2011-09-10 MED ORDER — LACTATED RINGERS IV SOLN
INTRAVENOUS | Status: DC | PRN
  Administered 2011-09-10: 15:00:00 via INTRAVENOUS

## 2011-09-10 MED ORDER — ATENOLOL 100 MG PO TABS
100.0000 mg | ORAL_TABLET | Freq: Every day | ORAL | Status: DC
  Administered 2011-09-11 – 2011-09-15 (×5): 100 mg via ORAL
  Filled 2011-09-10 (×5): qty 1

## 2011-09-10 MED ORDER — ACETAMINOPHEN 650 MG RE SUPP: 325.0000 mg | RECTAL | Status: DC | PRN

## 2011-09-10 MED ORDER — ZOLPIDEM TARTRATE 5 MG PO TABS
5.0000 mg | ORAL_TABLET | Freq: Every evening | ORAL | Status: DC | PRN
  Filled 2011-09-10: qty 1

## 2011-09-10 MED ORDER — HYDROCODONE-ACETAMINOPHEN 5-325 MG PO TABS
1.0000 | ORAL_TABLET | Freq: Once | ORAL | Status: AC
  Administered 2011-09-10: 1 via ORAL

## 2011-09-10 MED ORDER — HYDRALAZINE HCL 20 MG/ML IJ SOLN
10.0000 mg | INTRAMUSCULAR | Status: DC | PRN
  Filled 2011-09-10: qty 0.5

## 2011-09-10 MED ORDER — LABETALOL HCL 5 MG/ML IV SOLN
10.0000 mg | INTRAVENOUS | Status: DC | PRN
  Filled 2011-09-10: qty 4

## 2011-09-10 MED ORDER — 0.9 % SODIUM CHLORIDE (POUR BTL) OPTIME
TOPICAL | Status: DC | PRN
  Administered 2011-09-10: 1000 mL

## 2011-09-10 MED ORDER — GUAIFENESIN-DM 100-10 MG/5ML PO SYRP
15.0000 mL | ORAL_SOLUTION | ORAL | Status: DC | PRN
  Filled 2011-09-10: qty 15

## 2011-09-10 MED ORDER — MUPIROCIN 2 % EX OINT
TOPICAL_OINTMENT | CUTANEOUS | Status: AC
  Administered 2011-09-10: 1 via NASAL
  Filled 2011-09-10: qty 22

## 2011-09-10 MED ORDER — ONDANSETRON HCL 4 MG/2ML IJ SOLN: 4.0000 mg | Freq: Four times a day (QID) | INTRAMUSCULAR | Status: DC | PRN

## 2011-09-10 MED ORDER — MIDAZOLAM HCL 2 MG/2ML IJ SOLN: 0.5000 mg | Freq: Once | INTRAMUSCULAR | Status: DC | PRN

## 2011-09-10 MED ORDER — HEPARIN SODIUM (PORCINE) 5000 UNIT/ML IJ SOLN
5000.0000 [IU] | Freq: Three times a day (TID) | INTRAMUSCULAR | Status: DC
  Administered 2011-09-13 – 2011-09-14 (×2): 5000 [IU] via SUBCUTANEOUS
  Filled 2011-09-10 (×17): qty 1

## 2011-09-10 MED ORDER — CEPHALEXIN 500 MG PO CAPS
500.0000 mg | ORAL_CAPSULE | Freq: Two times a day (BID) | ORAL | Status: DC
  Administered 2011-09-11 – 2011-09-15 (×9): 500 mg via ORAL
  Filled 2011-09-10 (×11): qty 1

## 2011-09-10 MED ORDER — TAMSULOSIN HCL 0.4 MG PO CAPS
0.4000 mg | ORAL_CAPSULE | Freq: Every day | ORAL | Status: DC
  Administered 2011-09-11 – 2011-09-15 (×5): 0.4 mg via ORAL
  Filled 2011-09-10 (×5): qty 1

## 2011-09-10 MED ORDER — PROPOFOL 10 MG/ML IV EMUL
INTRAVENOUS | Status: DC | PRN
  Administered 2011-09-10: 150 mg via INTRAVENOUS

## 2011-09-10 MED ORDER — MEPERIDINE HCL 25 MG/ML IJ SOLN: 6.2500 mg | INTRAMUSCULAR | Status: DC | PRN

## 2011-09-10 SURGICAL SUPPLY — 31 items
BANDAGE ELASTIC 4 VELCRO ST LF (GAUZE/BANDAGES/DRESSINGS) IMPLANT
BANDAGE ELASTIC 6 VELCRO ST LF (GAUZE/BANDAGES/DRESSINGS) IMPLANT
BANDAGE GAUZE 4  KLING STR (GAUZE/BANDAGES/DRESSINGS) ×1 IMPLANT
BANDAGE GAUZE ELAST BULKY 4 IN (GAUZE/BANDAGES/DRESSINGS) IMPLANT
CANISTER SUCTION 2500CC (MISCELLANEOUS) ×2 IMPLANT
CLOTH BEACON ORANGE TIMEOUT ST (SAFETY) ×2 IMPLANT
COVER SURGICAL LIGHT HANDLE (MISCELLANEOUS) ×4 IMPLANT
DRAPE EXTREMITY T 121X128X90 (DRAPE) ×1 IMPLANT
ELECT REM PT RETURN 9FT ADLT (ELECTROSURGICAL) ×2
ELECTRODE REM PT RTRN 9FT ADLT (ELECTROSURGICAL) ×1 IMPLANT
GLOVE BIO SURGEON STRL SZ7.5 (GLOVE) ×2 IMPLANT
GOWN PREVENTION PLUS XLARGE (GOWN DISPOSABLE) ×2 IMPLANT
GOWN STRL NON-REIN LRG LVL3 (GOWN DISPOSABLE) ×4 IMPLANT
KIT BASIN OR (CUSTOM PROCEDURE TRAY) ×2 IMPLANT
KIT ROOM TURNOVER OR (KITS) ×2 IMPLANT
NS IRRIG 1000ML POUR BTL (IV SOLUTION) ×2 IMPLANT
PACK GENERAL/GYN (CUSTOM PROCEDURE TRAY) ×1 IMPLANT
PACK UNIVERSAL I (CUSTOM PROCEDURE TRAY) IMPLANT
PAD ARMBOARD 7.5X6 YLW CONV (MISCELLANEOUS) ×4 IMPLANT
SPONGE GAUZE 4X4 12PLY (GAUZE/BANDAGES/DRESSINGS) ×2 IMPLANT
STAPLER VISISTAT 35W (STAPLE) IMPLANT
SUT ETHILON 3 0 PS 1 (SUTURE) IMPLANT
SUT VIC AB 2-0 CTX 36 (SUTURE) IMPLANT
SUT VIC AB 3-0 SH 27 (SUTURE)
SUT VIC AB 3-0 SH 27X BRD (SUTURE) IMPLANT
SUT VICRYL 4-0 PS2 18IN ABS (SUTURE) IMPLANT
SWAB COLLECTION DEVICE MRSA (MISCELLANEOUS) ×1 IMPLANT
TOWEL OR 17X24 6PK STRL BLUE (TOWEL DISPOSABLE) ×2 IMPLANT
TOWEL OR 17X26 10 PK STRL BLUE (TOWEL DISPOSABLE) ×2 IMPLANT
TUBE ANAEROBIC SPECIMEN COL (MISCELLANEOUS) ×1 IMPLANT
WATER STERILE IRR 1000ML POUR (IV SOLUTION) ×2 IMPLANT

## 2011-09-10 NOTE — Anesthesia Procedure Notes (Signed)
Procedure Name: LMA Insertion Date/Time: 09/10/2011 3:50 PM Performed by: Leona Singleton A Pre-anesthesia Checklist: Patient identified Patient Re-evaluated:Patient Re-evaluated prior to inductionOxygen Delivery Method: Circle system utilized Preoxygenation: Pre-oxygenation with 100% oxygen Intubation Type: IV induction LMA: LMA inserted LMA Size: 4.0 Tube type: Oral Number of attempts: 1 Placement Confirmation: positive ETCO2 and breath sounds checked- equal and bilateral Tube secured with: Tape Dental Injury: Teeth and Oropharynx as per pre-operative assessment

## 2011-09-10 NOTE — Op Note (Signed)
Procedure: Incision and drainage of left below-knee amputation  Preoperative diagnosis: Infected left below-knee amputation  Postoperative diagnosis: Same  Anesthesia: Gen.  Specimens: Cultures from left below-knee amputation  Operative findings: Clear fluid with white granular material in cavity 3 x 2 cm  Operative details: After obtaining informed consent, the patient was taken to the operating room. The patient was placed in supine position operating table. Next the patient's entire left lower extremities draped in usual sterile fashion.  A transverse incision was made over the distal portion of the tibia on his left below-knee amputation. A clear fluid pocket was entered with approximately 5 cc of fluid. There was also a large amount of white granular material. This was all thoroughly irrigated with normal saline solution. Cultures were also sent. The wound was then packed with moist gauze. Patient tolerated the procedure well and there were no complications. Instrument sponge and needle count was correct at the end of the case. The patient was taken to the recovery room in stable condition.  Fabienne Bruns, MD [facility] Office: [phone] Pager: [phone]

## 2011-09-10 NOTE — Progress Notes (Signed)
Spoke with A. Zelenak,PAC, pt. Pain / request for pain med. - order rec'd .

## 2011-09-10 NOTE — H&P (View-Only) (Signed)
Patient is a 71 year old male seen several days ago for drainage from his left below-knee amputation by my partner Dr. Hart Rochester. He returns today for followup. As per Dr. Candie Chroman note he is been having some drainage from the anterior aspect of his left below-knee amputation. This has been clear in nature. He is been on Keflex for approximately 5 days with no symptomatic relief. He complains of considerable pain in the area.  He has no relief of the pain with Vicodin. He denies fever or chills.  Past Medical History  Diagnosis Date  . Diabetes mellitus   . Hypertension   . MI (myocardial infarction)   . S/P BKA (below knee amputation)   . History of cholecystectomy   . Hx of CABG   . PVD (peripheral vascular disease)   . Peripheral neuropathy   . Dyslipidemia   . Arthritis   . Atrial fibrillation   . Hyperlipidemia   . Hypercholesterolemia   . Chest pain   . CAD (coronary artery disease)     CABG - 1993  /  PTCA - 2004  /  Cath - 2006  . Gout   . Chronic kidney disease, stage III (moderate)   . CLL (chronic lymphocytic leukemia)   . CHF (congestive heart failure)   . Bilateral renal artery stenosis     status post stents  . Polycythemia    Past Surgical History  Procedure Date  . Coronary angioplasty with stent placement 2004    second diagonal artery -- [name], M.D.   . Cardiac catheterization 2006    Est. EF of 65% -- Diffuse coronary artery disease.  The second diagonal artery that was angioplastied in 2004 is now occluded.  It is no longer a candidate for PTCA since it is now flush occluded.  Normal LV systolic function.  [name], M.D.  . Renal artery stent 2003    Bilateral renal artery stenosis  . Total knee arthroplasty     left  . Coronary artery bypass graft 1993    left internal mammary artery to his LAD artery --   . Coronary artery bypass graft 1994    vein graft failure of in his right coronary artery  . Distal clavicle excision 2003   SURGEON:  [name], M.D.  . Tendon repair     Left Achilles repair x2   History   Social History  . Marital Status: Married    Spouse Name: N/A    Number of Children: N/A  . Years of Education: N/A   Occupational History  . Not on file.   Social History Main Topics  . Smoking status: Former Smoker -- 25 years    Types: Cigarettes    Quit date: 04/14/1976  . Smokeless tobacco: Never Used  . Alcohol Use: No  . Drug Use: No  . Sexually Active: Not on file   Other Topics Concern  . Not on file   Social History Narrative  . No narrative on file    Physical exam: Filed Vitals:   09/04/11 1421  BP: 150/85  Pulse: 53  Temp: 97.6 F (36.4 C)  TempSrc: Oral  Height: 5\' 8"  (1.727 m)  Weight: 163 lb (73.936 kg)   2 cm area of erythema over the tibial portion of his below-knee dictation. There is clear yellow drainage from this from a less than 1 mm opening  Assessment: Possible infection left below-knee amputation  Plan: Incision and drainage of this area  in the operating room next week. The patient was given a prescription for oxycodone 10 mg #40 dispensed today.  Fabienne Bruns, MD [facility] Office: [phone] Pager: [phone]

## 2011-09-10 NOTE — Transfer of Care (Signed)
Immediate Anesthesia Transfer of Care Note  Patient: Donald Dudley  Procedure(s) Performed: Procedure(s) (LRB): IRRIGATION AND DEBRIDEMENT EXTREMITY (Left)  Patient Location: PACU  Anesthesia Type: General  Level of Consciousness: sedated and patient cooperative  Airway & Oxygen Therapy: Patient Spontanous Breathing and Patient connected to nasal cannula oxygen  Post-op Assessment: Report given to PACU RN and Post -op Vital signs reviewed and stable  Post vital signs: Reviewed and stable  Complications: No apparent anesthesia complications

## 2011-09-10 NOTE — Interval H&P Note (Signed)
History and Physical Interval Note:  09/10/2011 8:30 AM  Donald Dudley  has presented today for surgery, with the diagnosis of NONHEALING WOUND  The various methods of treatment have been discussed with the patient and family. After consideration of risks, benefits and other options for treatment, the patient has consented to  Procedure(s) (LRB): IRRIGATION AND DEBRIDEMENT EXTREMITY (Left) as a surgical intervention .  The patients' history has been reviewed, patient examined, no change in status, stable for surgery.  I have reviewed the patients' chart and labs.  Questions were answered to the patient's satisfaction.     [name]

## 2011-09-10 NOTE — Anesthesia Postprocedure Evaluation (Signed)
  Anesthesia Post-op Note  Patient: Donald Dudley  Procedure(s) Performed: Procedure(s) (LRB): IRRIGATION AND DEBRIDEMENT EXTREMITY (Left)  Patient Location: PACU  Anesthesia Type: General  Level of Consciousness: awake, alert  and oriented  Airway and Oxygen Therapy: Patient Spontanous Breathing and Patient connected to nasal cannula oxygen  Post-op Pain: mild  Post-op Assessment: Post-op Vital signs reviewed  Post-op Vital Signs: Reviewed  Complications: No apparent anesthesia complications

## 2011-09-10 NOTE — Progress Notes (Signed)
Received patient from PACU. Alert and oriented with O2 at 2lpm  Via nasal cannula.  PIV noted on L-hand. With dressing on L-BKA with bloody drainage on the dressing.  Report received from Schuyler Amor, RN

## 2011-09-10 NOTE — Anesthesia Preprocedure Evaluation (Addendum)
Anesthesia Evaluation  Patient identified by MRN, date of birth, ID band Patient awake    Reviewed: Allergy & Precautions, H&P , NPO status , Patient's Chart, lab work & pertinent test results  History of Anesthesia Complications Negative for: history of anesthetic complications  Airway Mallampati: I TM Distance: >3 FB Neck ROM: Full    Dental  (+) Edentulous Upper, Partial Lower and Dental Advisory Given   Pulmonary former smoker (quit 1978) breath sounds clear to auscultation  Pulmonary exam normal       Cardiovascular hypertension, Pt. on medications and Pt. on home beta blockers + angina + CAD (stress test '12: no ischemia or scar, EF 68%), + Past MI (1991), + Cardiac Stents (2004), + CABG (1993), + Peripheral Vascular Disease and +CHF + dysrhythmias Atrial Fibrillation Rhythm:Irregular Rate:Normal  Est. EF of 65% -- Diffuse coronary artery disease.  The second diagonal artery that was angioplastied in 2004 is now occluded.  It is no longer a candidate for PTCA since it is now flush occluded.  Normal LV systolic function.  [name], M.D.   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), Well Controlled, Type 2, Insulin DependentGout CLL  Renal/GU Renal InsufficiencyRenal disease     Musculoskeletal  (+) Arthritis -, Osteoarthritis,    Abdominal   Peds  Hematology Polycythemia    Anesthesia Other Findings   Reproductive/Obstetrics                        Anesthesia Physical Anesthesia Plan  ASA: III  Anesthesia Plan: General   Post-op Pain Management:    Induction: Intravenous  Airway Management Planned: LMA  Additional Equipment:   Intra-op Plan:   Post-operative Plan:   Informed Consent: I have reviewed the patients History and Physical, chart, labs and  discussed the procedure including the risks, benefits and alternatives for the proposed anesthesia with the patient or authorized representative who has indicated his/her understanding and acceptance.   Dental advisory given  Plan Discussed with: CRNA and Surgeon  Anesthesia Plan Comments: (Plan routine monitors, GA- LMA OK)        Anesthesia Quick Evaluation

## 2011-09-10 NOTE — Progress Notes (Addendum)
Called into pt. Room, by family, pt. C/o pain &  Nausea.   Asking for another pain med. States he took hydrocodone last at 0700- 1/2 tab. of 10/650mg .  Pt. Reports that he is nauseated related to not eating & taking meds. this a.m. Last dose of insulin taken last p.m. - 20 units of 70/30.  Contacting pharm. To clarify pain med. Order/schedule.  Pt. Given comfort measures & assured anesth. will be contacted to determine indicated pain med.

## 2011-09-11 ENCOUNTER — Encounter (HOSPITAL_COMMUNITY): Payer: Self-pay | Admitting: Vascular Surgery

## 2011-09-11 LAB — GLUCOSE, CAPILLARY
Glucose-Capillary: 198 mg/dL — ABNORMAL HIGH (ref 70–99)
Glucose-Capillary: 149 mg/dL — ABNORMAL HIGH (ref 70–99)
Glucose-Capillary: 145 mg/dL — ABNORMAL HIGH (ref 70–99)
Glucose-Capillary: 121 mg/dL — ABNORMAL HIGH (ref 70–99)

## 2011-09-11 MED ORDER — HYDROCODONE-ACETAMINOPHEN 10-325 MG PO TABS
1.0000 | ORAL_TABLET | Freq: Four times a day (QID) | ORAL | Status: DC | PRN
  Administered 2011-09-11 – 2011-09-15 (×15): 1 via ORAL
  Filled 2011-09-11 (×15): qty 1

## 2011-09-11 MED ORDER — INSULIN ASPART 100 UNIT/ML ~~LOC~~ SOLN
0.0000 [IU] | Freq: Three times a day (TID) | SUBCUTANEOUS | Status: DC
  Administered 2011-09-11: 2 [IU] via SUBCUTANEOUS
  Administered 2011-09-11: 3 [IU] via SUBCUTANEOUS
  Administered 2011-09-12 (×2): 2 [IU] via SUBCUTANEOUS
  Administered 2011-09-13 (×2): 3 [IU] via SUBCUTANEOUS
  Administered 2011-09-14: 2 [IU] via SUBCUTANEOUS
  Administered 2011-09-14: 1 [IU] via SUBCUTANEOUS
  Administered 2011-09-15: 2 [IU] via SUBCUTANEOUS

## 2011-09-11 NOTE — Progress Notes (Signed)
[facility]  Subjective  - complains of pain, wants PCA  Objective 173/56 77 98.4 F (36.9 C) (Oral) 18 100%  Intake/Output Summary (Last 24 hours) at 09/11/11 0943 Last data filed at 09/11/11 0919  Gross per 24 hour  Intake 1776.67 ml  Output    950 ml  Net 826.67 ml   Left BKA wound clean some oozing  Assessment/Planning: DM- per DM coodinator Wound care BKA NS wet to dry Follow up cultures Emphasized to pt that this small wound should not require PCA Will switch from Percocet to Vicodin at his request Need to clarify his hydroxyurea dose  Zylee Marchiano E 09/11/2011 9:43 AM --  Laboratory Lab Results:  Basename 09/10/11 1814 09/10/11 1149  WBC 25.6* 28.9*  HGB 13.4 13.9  HCT 49.6 49.9  PLT 261 280   BMET  Basename 09/10/11 1814 09/10/11 1149  NA 141 140  K 3.2* 3.8  CL 93* 94*  CO2 33* 32  GLUCOSE 84 90  BUN 53* 57*  CREATININE 1.81* 1.92*  CALCIUM 9.6 9.9    COAG Lab Results  Component Value Date   INR 1.23 09/10/2011   INR 1.18 09/10/2011   INR 1.19 08/09/2010   No results found for this basename: PTT    Antibiotics Anti-infectives     Start     Dose/Rate Route Frequency Ordered Stop   09/10/11 2200   cephALEXin (KEFLEX) capsule 500 mg        500 mg Oral 2 times daily 09/10/11 1741     09/09/11 1432   cefUROXime (ZINACEF) 1.5 g in dextrose 5 % 50 mL IVPB        1.5 g 100 mL/hr over 30 Minutes Intravenous 30 min pre-op 09/09/11 1432 09/10/11 1537

## 2011-09-11 NOTE — Progress Notes (Signed)
Pt reports insulin use at home. No orders at this time. MD, Dr. Darrick Penna, notified via page with call back. Made aware of am cbg. To address on rounds. Dondra Spry

## 2011-09-11 NOTE — Progress Notes (Signed)
Dr. Hart Rochester paged and notified of patients dressing with bloody drainage, dressing saturated with blood. Dr. Hart Rochester gave ordered to changed dressing and place guaze, kerlex , and ace bandage wrap. Pt refused ace bandage wrap due to increased pain when placed and patient requested kerlex secure on site with tape. Pt's dressing reinforced with part of abd pad for incision showing  Bleeding. Will continue to monitor.

## 2011-09-12 LAB — GLUCOSE, CAPILLARY
Glucose-Capillary: 119 mg/dL — ABNORMAL HIGH (ref 70–99)
Glucose-Capillary: 134 mg/dL — ABNORMAL HIGH (ref 70–99)

## 2011-09-12 NOTE — Progress Notes (Addendum)
[facility]  Subjective  - POD #1  debridement of distal stump left LE.  Objective 133/69 83 98.3 F (36.8 C) (Oral) 18 99%  Intake/Output Summary (Last 24 hours) at 09/12/11 0815 Last data filed at 09/12/11 0600  Gross per 24 hour  Intake 1770.83 ml  Output   1025 ml  Net 745.83 ml    No erythema, no active drainage.  Wet to dry dressing changed at bed side.    Assessment/Planning: POD #1  No growth on cultures final results pending. Continue current treatment will discuss plan with Dr. Ivar Drape, EMMA [facility] 09/12/2011 8:15 AM --  Pt wishes to pursued SNF placement at this point. Will involve social work Ready for discharge when SNF available  Fabienne Bruns, MD [facility] Office: [phone] Pager: [phone]

## 2011-09-12 NOTE — Progress Notes (Signed)
Utilization review completed. Maximus Hoffert RN, CCM  

## 2011-09-12 NOTE — Progress Notes (Signed)
   CARE MANAGEMENT NOTE 09/12/2011  Patient:  Donald Dudley, Donald Dudley   Account Number:  [id number]  Date Initiated:  09/12/2011  Documentation initiated by:  Darlyne Russian  Subjective/Objective Assessment:   Patient admitted non healing wound at BKA.     Action/Plan:   Progression of care and discharge planning   Anticipated DC Date:  09/15/2011   Anticipated DC Plan:  SKILLED NURSING FACILITY  In-house referral  Clinical Social Worker      DC Planning Services  CM consult      Choice offered to / List presented to:             Status of service:  In process, will continue to follow Medicare Important Message given?   (If response is "NO", the following Medicare IM given date fields will be blank) Date Medicare IM given:   Date Additional Medicare IM given:    Discharge Disposition:    Per UR Regulation:    If discussed at Long Length of Stay Meetings, dates discussed:    Comments:  09/12/2011  [address] RN, [place] [phone] PCP: Dr Su Hilt  Met with patient and wife regarding CM and discharge planning. He lives at home with spouse, the house is adapted for his mobility and care needs. He has a prosthesis for L leg and able to walk with only use of a cane. He has pain on right foot and unable to bear weight. He uses a manual wheelchair at home and has attempted to get approval for a [employer] and it was declined. Dr Su Hilt office is working with the company to get approved. He had [facility] after his BKA for PT and requests [facility] again. He had gone to [facility] after the BKA and liked it there, his wife also requested [facility].  CM to continue to follow for discharge planning needs.

## 2011-09-13 LAB — GLUCOSE, CAPILLARY
Glucose-Capillary: 108 mg/dL — ABNORMAL HIGH (ref 70–99)
Glucose-Capillary: 96 mg/dL (ref 70–99)

## 2011-09-13 LAB — WOUND CULTURE

## 2011-09-13 NOTE — Progress Notes (Signed)
Patient ID: Donald Dudley, male   DOB: 1940/09/01, 71 y.o.   MRN: [id number] Vascular Surgery Progress Note  Subjective: Post exploration BKA stump patient awaiting skilled nursing facility. Dressing being changed daily  Objective:  Filed Vitals:   09/13/11 1008  BP: 139/54  Pulse: 56  Temp: 97.4 F (36.3 C)  Resp: 16    General alert and oriented x3 in no apparent distress Bloody and the patient's stump dressing dry   Labs:  Lab 09/10/11 1814 09/10/11 1149  CREATININE 1.81* 1.92*    Lab 09/10/11 1814 09/10/11 1149  NA 141 140  K 3.2* 3.8  CL 93* 94*  CO2 33* 32  BUN 53* 57*  CREATININE 1.81* 1.92*  LABGLOM -- --  GLUCOSE 84 --  CALCIUM 9.6 9.9    Lab 09/10/11 1814 09/10/11 1149  WBC 25.6* 28.9*  HGB 13.4 13.9  HCT 49.6 49.9  PLT 261 280    Lab 09/10/11 1814 09/10/11 1149  INR 1.23 1.18    I/O last 3 completed shifts: In: 2330.8 [P.O.:960; I.V.:1370.8] Out: 3225 [Urine:3225]  Imaging: No results found.  Assessment/Plan:  POD #3  LOS: 3 days  s/p Procedure(s): IRRIGATION AND DEBRIDEMENT EXTREMITY  Doing well post irrigation and debridement left BKA stump Await SNF   Josephina Gip, MD 09/13/2011 10:19 AM

## 2011-09-13 NOTE — H&P (Signed)
Patient is sustaining HR in 40's.  VS @ 0330 are T-97.7, HR-45, B/P-145/50, Resp-16, 99% RA.  Patient is asymptomatic.  Dr. Hart Rochester made aware.  Per MD, will continue to monitor patient and call MD if patient becomes symptomatic.  Stacie Glaze  09/13/2011

## 2011-09-13 NOTE — Progress Notes (Signed)
Physical Therapy Treatment Patient Details Name: NOCHOLAS DAMASO MRN: [id number] DOB: 1940/09/12 Today's Date: 09/13/2011 Time: 7829-5621 PT Time Calculation (min): 23 min  PT Assessment / Plan / Recommendation Comments on Treatment Session  Pt. did well with first attempt at w/c transfers and is very pleased to be OOB.  He stated "it feels like home".    Follow Up Recommendations  Skilled nursing facility    Barriers to Discharge        Equipment Recommendations  Defer to next venue    Recommendations for Other Services    Frequency Min 3X/week   Plan Discharge plan remains appropriate    Precautions / Restrictions Restrictions Weight Bearing Restrictions: No   Pertinent Vitals/Pain Pain in left residual l limb rated 6/10, pt. Premedicated    Mobility  Bed Mobility Bed Mobility: Supine to Sit;Sitting - Scoot to Edge of Bed Supine to Sit: 6: Modified independent (Device/Increase time);With rails Sitting - Scoot to Edge of Bed: 6: Modified independent (Device/Increase time);With rail Details for Bed Mobility Assistance: vc's for safe technique Transfers Transfers: Squat Pivot Transfers Squat Pivot Transfers: 4: Min assist;With upper extremity assistance Details for Transfer Assistance: vc's for safety, min assist for transfer and balance; over to w/c with removable arm rests.  Reminded pt. to call for assist back to bed and discussed with nursing tech how to assist pt. back to bed     Wheelchair Mobility Wheelchair Mobility: Yes Wheelchair Assistance: 4: Min Best boy: Both upper extremities;Right lower extremity Wheelchair Parts Management: Supervision/cueing Distance: 5    Exercises     PT Diagnosis:    PT Problem List:   PT Treatment Interventions:     PT Goals Acute Rehab PT Goals PT Goal: Supine/Side to Sit - Progress: Progressing toward goal PT Transfer Goal: Bed to Chair/Chair to Bed - Progress: Progressing toward goal  Visit  Information  Last PT Received On: 09/13/11 Assistance Needed: +1    Subjective Data  Subjective: I just had pain med   Cognition  Overall Cognitive Status: Appears within functional limits for tasks assessed/performed Arousal/Alertness: Awake/alert Orientation Level: Oriented X4 / Intact Behavior During Session: [facility] for tasks performed    Balance     End of Session PT - End of Session Equipment Utilized During Treatment: Gait belt Activity Tolerance: Patient tolerated treatment well Patient left: in chair;with call bell/phone within reach;with family/visitor present Nurse Communication: Mobility status    Ferman Hamming 09/13/2011, 3:38 PM Acute Rehabilitation Services [phone] (pager)

## 2011-09-13 NOTE — Evaluation (Signed)
Physical Therapy Evaluation Patient Details Name: Donald Dudley MRN: [id number] DOB: July 31, 1940 Today's Date: 09/13/2011 Time: 4098-1191 PT Time Calculation (min): 26 min  PT Assessment / Plan / Recommendation Clinical Impression  Pt. is s/p debridement of left residual limb due to infection.  Pt. was independent in home at w/c level but now with decreased independence of mobility, transfers and gait.  Will benefit from acuite PT to address these and below issues.  Agree with need for ST SNF for rehab before returning home.  Should progress well with therapies.    PT Assessment  Patient needs continued PT services    Follow Up Recommendations  Skilled nursing facility    Barriers to Discharge Decreased caregiver support      lEquipment Recommendations  Defer to next venue    Recommendations for Other Services     Frequency Min 3X/week    Precautions / Restrictions Precautions Precautions: Fall Restrictions Weight Bearing Restrictions: No Other Position/Activity Restrictions: pt. reports tolerating little wight on right LE due to pain and bone on bone arthritis   Pertinent Vitals/Pain Pt. Recently premedicated, reports pain in left residual limb at 6/10.      Mobility  Bed Mobility Bed Mobility: Supine to Sit;Sit to Supine Supine to Sit: 6: Modified independent (Device/Increase time);With rails;HOB elevated Sit to Supine: 6: Modified independent (Device/Increase time);With rail;HOB elevated Details for Bed Mobility Assistance: safe technique moving to EOB and back to supine Transfers Transfers: Not assessed (pt. deferred, did not want to get OOB to chair)    Exercises Amputee Exercises Quad Sets: AROM;Left;10 reps;Supine Hip ABduction/ADduction: AROM;Left;10 reps;Supine   PT Diagnosis: Acute pain;Difficulty walking  PT Problem List: Decreased strength;Decreased range of motion;Decreased activity tolerance;Decreased mobility;Decreased knowledge of use of DME;Decreased  knowledge of precautions;Pain PT Treatment Interventions: DME instruction;Functional mobility training;Therapeutic activities;Therapeutic exercise;Balance training;Patient/family education   PT Goals Acute Rehab PT Goals PT Goal Formulation: With patient Time For Goal Achievement: 09/20/11 Potential to Achieve Goals: Good Pt will go Supine/Side to Sit: Independently PT Goal: Supine/Side to Sit - Progress: Goal set today Pt will go Sit to Supine/Side: Independently PT Goal: Sit to Supine/Side - Progress: Goal set today Pt will go Sit to Stand: with min assist;with upper extremity assist PT Goal: Sit to Stand - Progress: Goal set today Pt will go Stand to Sit: with min assist;with upper extremity assist PT Goal: Stand to Sit - Progress: Goal set today Pt will Transfer Bed to Chair/Chair to Bed: with min assist PT Transfer Goal: Bed to Chair/Chair to Bed - Progress: Goal set today Pt will Perform Home Exercise Program: Independently PT Goal: Perform Home Exercise Program - Progress: Goal set today  Visit Information  Last PT Received On: 09/13/11 Assistance Needed: +1    Subjective Data  Subjective: I haven't had an appetite Patient Stated Goal: wear left prosthesis to move around a little in the house   Prior Functioning  Home Living Lives With: Spouse Available Help at Discharge: Family;Available 24 hours/day Type of Home: House Home Access: Ramped entrance Home Layout: Two level;Able to live on main level with bedroom/bathroom Bathroom Shower/Tub: Walk-in shower;Door [employer] Toilet: Standard Bathroom Accessibility: Yes How Accessible: Accessible via wheelchair Home Adaptive Equipment: Wheelchair - manual;Walker - rolling;Bedside commode/3-in-1;Shower chair without back Prior Function Level of Independence: Independent with assistive device(s) (at wc level) Driving: No Vocation: Retired Musician: No difficulties Dominant Hand: Right    Cognition   Overall Cognitive Status: Appears within functional limits for tasks assessed/performed Arousal/Alertness: Awake/alert  Orientation Level: Oriented X4 / Intact Behavior During Session: [facility] for tasks performed    Extremity/Trunk Assessment Right Upper Extremity Assessment RUE ROM/Strength/Tone: [facility] for tasks assessed Left Upper Extremity Assessment LUE ROM/Strength/Tone: WFL for tasks assessed Right Lower Extremity Assessment RLE ROM/Strength/Tone: Deficits;Due to pain RLE ROM/Strength/Tone Deficits: pt. generally 4-/5 in right LE, knee ext. -20 RLE Sensation: Deficits RLE Sensation Deficits: decreased to light touch below mid lower leg Left Lower Extremity Assessment LLE ROM/Strength/Tone: WFL for tasks assessed LLE Sensation: WFL - Light Touch Trunk Assessment Trunk Assessment: Normal   Balance Balance Balance Assessed: Yes Static Sitting Balance Static Sitting - Balance Support: No upper extremity supported;Feet supported (right foot supported) Static Sitting - Level of Assistance: 6: Modified independent (Device/Increase time) Static Sitting - Comment/# of Minutes: 10  End of Session PT - End of Session Activity Tolerance: Patient tolerated treatment well Patient left: in bed;with call bell/phone within reach   Ferman Hamming 09/13/2011, 9:44 AM Acute Rehabilitation Services [phone] (pager)

## 2011-09-14 LAB — GLUCOSE, CAPILLARY: Glucose-Capillary: 151 mg/dL — ABNORMAL HIGH (ref 70–99)

## 2011-09-14 NOTE — Progress Notes (Signed)
Patient ID: Donald Dudley, male   DOB: [birth year], 71 y.o.   MRN: [id number] Vascular Surgery Progress Note  Subjective: Post debridement of left BKA stump  Objective:  Filed Vitals:   09/14/11 0519  BP: 150/67  Pulse: 46  Temp: 97.6 F (36.4 C)  Resp: 18    General alert and oriented x3 Dressing changed-wound is clean and granulating-no purulent drainage   Labs:  Lab 09/10/11 1814 09/10/11 1149  CREATININE 1.81* 1.92*    Lab 09/10/11 1814 09/10/11 1149  NA 141 140  K 3.2* 3.8  CL 93* 94*  CO2 33* 32  BUN 53* 57*  CREATININE 1.81* 1.92*  LABGLOM -- --  GLUCOSE 84 --  CALCIUM 9.6 9.9    Lab 09/10/11 1814 09/10/11 1149  WBC 25.6* 28.9*  HGB 13.4 13.9  HCT 49.6 49.9  PLT 261 280    Lab 09/10/11 1814 09/10/11 1149  INR 1.23 1.18    I/O last 3 completed shifts: In: 2600 [P.O.:1800; I.V.:800] Out: 4000 [Urine:4000]  Imaging: No results found.  Assessment/Plan:  POD #4  LOS: 4 days  s/p Procedure(s): IRRIGATION AND DEBRIDEMENT EXTREMITY  Doing well post debridement left BKA stump with dressing changes being performed For skilled nursing facility this week   Josephina Gip, MD 09/14/2011 8:46 AM

## 2011-09-14 NOTE — Clinical Social Work Psychosocial (Signed)
Clinical Social Work Department BRIEF PSYCHOSOCIAL ASSESSMENT 09/14/2011  Patient:  Donald Dudley, Donald Dudley     Account Number:  [id number]     Admit date:  09/10/2011  Clinical Social Worker:  Thomasene Mohair  Date/Time:  09/14/2011 03:00 PM  Referred by:  RN  Date Referred:  09/12/2011 Referred for  SNF Placement   Other Referral:   Interview type:  Patient Other interview type:   family    PSYCHOSOCIAL DATA Living Status:  WIFE Admitted from facility:   Level of care:   Primary support name:  Rebecca/ [phone]/ [phone] Primary support relationship to patient:  SPOUSE Degree of support available:   good. Pt has 2 children in [place], 5 grandchildren, and new great grandchildren    CURRENT CONCERNS Current Concerns  Post-Acute Placement  Adjustment to Illness  Behavioral Health Issues   Other Concerns:    SOCIAL WORK ASSESSMENT / PLAN Clinical social worker was referred to Pt to assist with discharge planning. Pt lives with his wife of 53 years who is at bedside. 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 Pt and his wife love to camp prior to his illnesses. Pt has gone to snf several times for st-rehab over the last few years. Pt shares that his health and strength are continuing to decline and he is losing mobility. Pt is tearful and states that he has thought about ending it all several times. Pt is tearful when he discussed being on pain medicines for 3 years and how that is "ridiculous."  Pt's wife offers support immediately by asking him not to cry. CSW offered empathetic support and normalized Pt's feelings for his situation. CSW asked Pt if he is on an anitdepressant and he stated "xanax, isn't that one?"  CSW suggested Pt f/u with doctor re: possibility of antidepressant. CSW and family discussed st-snf as well. Pt is agreeable and has requested one near his home to make travel easier on his wife.  CSW will begin bed search process and unit CSW will update pt on  offers.   Assessment/plan status:  Psychosocial Support/Ongoing Assessment of Needs Other assessment/ plan:   Information/referral to community resources:   Information on [facility] for future needs to keep Pt in home.    PATIENT'S/FAMILY'S RESPONSE TO PLAN OF CARE: Pt and Pt's wife agreeable to SNF. Pt continues to have difficulty adjusting to his physical ailments and loss of independence.  Pt makes several statements that "I'll never be able to again . . " etc. Pt would benefit from people normalizing his emotions instead of trying to get him to not express them.    Frederico Hamman, LCSW [phone] weekend coverage

## 2011-09-15 ENCOUNTER — Telehealth: Payer: Self-pay | Admitting: Vascular Surgery

## 2011-09-15 LAB — GLUCOSE, CAPILLARY: Glucose-Capillary: 141 mg/dL — ABNORMAL HIGH (ref 70–99)

## 2011-09-15 MED ORDER — HYDROCODONE-ACETAMINOPHEN 10-660 MG PO TABS: 1.0000 | ORAL_TABLET | Freq: Four times a day (QID) | ORAL | Status: DC | PRN

## 2011-09-15 NOTE — Progress Notes (Addendum)
[facility] Progress Note  09/15/2011 9:10 AM POD 5  Subjective:  No complaints-worried about his wife  Afebrile x 24 hours Filed Vitals:   09/15/11 0512  BP: 173/63  Pulse: 54  Temp: 97.8 F (36.6 C)  Resp: 18    Physical Exam: Incisions:  Wound looks good with good granulation tissue Extremities:  Good movement of LLE  CBC    Component Value Date/Time   WBC 25.6* 09/10/2011 1814   WBC 18.6* 06/28/2009 0806   RBC 6.39* 09/10/2011 1814   RBC 5.75 06/28/2009 0806   HGB 13.4 09/10/2011 1814   HGB 14.0 06/28/2009 0806   HCT 49.6 09/10/2011 1814   HCT 46.3 06/28/2009 0806   PLT 261 09/10/2011 1814   PLT 231 06/28/2009 0806   MCV 77.6* 09/10/2011 1814   MCV 80.5 06/28/2009 0806   MCH 21.0* 09/10/2011 1814   MCH 24.3* 06/28/2009 0806   MCHC 27.0* 09/10/2011 1814   MCHC 30.1* 06/28/2009 0806   RDW 19.3* 09/10/2011 1814   RDW 22.0* 06/28/2009 0806   LYMPHSABS 0.6* 08/09/2010 1347   LYMPHSABS 0.8* 06/28/2009 0806   MONOABS 2.1* 08/09/2010 1347   MONOABS 1.3* 06/28/2009 0806   EOSABS 0.4 08/09/2010 1347   EOSABS 0.3 06/28/2009 0806   BASOSABS 0.2* 08/09/2010 1347   BASOSABS 0.0 06/28/2009 0806    BMET    Component Value Date/Time   NA 141 09/10/2011 1814   K 3.2* 09/10/2011 1814   CL 93* 09/10/2011 1814   CO2 33* 09/10/2011 1814   GLUCOSE 84 09/10/2011 1814   BUN 53* 09/10/2011 1814   CREATININE 1.81* 09/10/2011 1814   CALCIUM 9.6 09/10/2011 1814   GFRNONAA 35* 09/10/2011 1814   GFRAA 41* 09/10/2011 1814    INR    Component Value Date/Time   INR 1.23 09/10/2011 1814     Intake/Output Summary (Last 24 hours) at 09/15/11 0910 Last data filed at 09/15/11 0700  Gross per 24 hour  Intake    960 ml  Output   2350 ml  Net  -1390 ml     Assessment/Plan:  71 y.o. male is s/p I&D left BKA stump POD 5 -wound with good granulation tissue. -continue wet to dry dressing changes  -continue Keflex-pt afebrile x 24 hours -continue SQ heparin for DVT prophylaxis   Doreatha Massed, PA-C [facility] [phone] 09/15/2011 9:10 AM   Exam details as above Culture negative to date  D/C to SNF when bed available  Fabienne Bruns, MD [facility] [place] Office: [phone] Pager: [phone]

## 2011-09-15 NOTE — Discharge Summary (Signed)
[facility] Discharge Summary  Donald Dudley May 31, 1940 71 y.o. male  [id number]  Admission Date: 09/10/2011  Discharge Date: 09/15/11  Physician: Donald Kerns, MD  Admission Diagnosis: NONHEALING WOUND   HPI:   This is a 71 y.o. male seen several days ago for drainage from his left below-knee amputation by my partner Dr. Hart Dudley. He returns today for followup. As per Dr. Candie Dudley note he is been having some drainage from the anterior aspect of his left below-knee amputation. This has been clear in nature. He is been on Keflex for approximately 5 days with no symptomatic relief. He complains of considerable pain in the area. He has no relief of the pain with Vicodin. He denies fever or chills.    Hospital Course:  The patient was admitted to the hospital and taken to the operating room on 09/10/2011 and underwent Incision and drainage of left below-knee amputation.  Cultures were obtained and so far are NGTD. The pt tolerated the procedure well and was transported to the PACU in good condition. Pt is also on Keflex post op pending Cx.  WBC also trending downward.  The remainder of the hospital course consisted of increasing mobilization and increasing intake of solids without difficulty.  CBC    Component Value Date/Time   WBC 25.6* 09/10/2011 1814   WBC 18.6* 06/28/2009 0806   RBC 6.39* 09/10/2011 1814   RBC 5.75 06/28/2009 0806   HGB 13.4 09/10/2011 1814   HGB 14.0 06/28/2009 0806   HCT 49.6 09/10/2011 1814   HCT 46.3 06/28/2009 0806   PLT 261 09/10/2011 1814   PLT 231 06/28/2009 0806   MCV 77.6* 09/10/2011 1814   MCV 80.5 06/28/2009 0806   MCH 21.0* 09/10/2011 1814   MCH 24.3* 06/28/2009 0806   MCHC 27.0* 09/10/2011 1814   MCHC 30.1* 06/28/2009 0806   RDW 19.3* 09/10/2011 1814   RDW 22.0* 06/28/2009 0806   LYMPHSABS 0.6* 08/09/2010 1347   LYMPHSABS 0.8* 06/28/2009 0806   MONOABS 2.1* 08/09/2010 1347   MONOABS 1.3* 06/28/2009 0806   EOSABS 0.4 08/09/2010 1347   EOSABS 0.3 06/28/2009 0806   BASOSABS 0.2* 08/09/2010 1347   BASOSABS 0.0 06/28/2009 0806    BMET    Component Value Date/Time   NA 141 09/10/2011 1814   K 3.2* 09/10/2011 1814   CL 93* 09/10/2011 1814   CO2 33* 09/10/2011 1814   GLUCOSE 84 09/10/2011 1814   BUN 53* 09/10/2011 1814   CREATININE 1.81* 09/10/2011 1814   CALCIUM 9.6 09/10/2011 1814   GFRNONAA 35* 09/10/2011 1814   GFRAA 41* 09/10/2011 1814     Discharge Instructions:   The patient is discharged to home with extensive instructions on wound care and progressive ambulation.  They are instructed not to drive or perform any heavy lifting until returning to see the physician in his office.  Discharge Orders    Future Orders Please Complete By Expires   Resume previous diet      Lifting restrictions      Comments:   No lifting for 6 weeks   Call MD for:  temperature >100.5      Call MD for:  redness, tenderness, or signs of infection (pain, swelling, bleeding, redness, odor or green/yellow discharge around incision site)      Call MD for:  severe or increased pain, loss or decreased feeling  in affected limb(s)      Discharge wound care:      Comments:  Shower daily with soap and water starting 09/16/11 and then wet to dry dressing changes twice daily.      Discharge Diagnosis:  NONHEALING WOUND  Secondary Diagnosis: Patient Active Problem List  Diagnoses  . A-fib  . CAD (coronary artery disease)  . HTN (hypertension)  . Atherosclerosis of native arteries of the extremities with ulceration   Past Medical History  Diagnosis Date  . S/P BKA (below knee amputation)   . History of cholecystectomy   . Hx of CABG   . PVD (peripheral vascular disease)   . Peripheral neuropathy   . Dyslipidemia   . Arthritis   . Hyperlipidemia   . Hypercholesterolemia   . Chest pain   . CAD (coronary artery disease)     CABG - 1993  /  PTCA - 2004  /  Cath - 2006  . Gout     takes Allopurinol daily  . Chronic kidney disease, stage  III (moderate)   . CLL (chronic lymphocytic leukemia)   . CHF (congestive heart failure)   . Bilateral renal artery stenosis     status post stents  . Polycythemia   . Hypertension     takes Atenolol,Maxzide,and Isosorbide daily  . Atrial fibrillation     not on Coumadin  . MI (myocardial infarction) 1991  . Bruises easily   . Constipation     related to pain meds-takes OTC stool softener daily  . Peripheral edema     takes Torsemide prn  . Urinary frequency   . Enlarged prostate     takes flomax daily  . Diabetes mellitus     takes Novolin 70/30;average fasting 100-120  . Anxiety     takes Xanax daily  . Insomnia     Ambien prn;but none if over a yr      Donald Dudley  Home Medication Instructions ZOX:[id number]   Printed on:09/15/11 0935  Medication Information                    atenolol (TENORMIN) 100 MG tablet Take 100 mg by mouth daily.            triamterene-hydrochlorothiazide (MAXZIDE) 75-50 MG per tablet Take 1 tablet by mouth daily.             torsemide (DEMADEX) 20 MG tablet Take 20 mg by mouth daily.            Tamsulosin HCl (FLOMAX) 0.4 MG CAPS Take 0.4 mg by mouth daily.             hydroxyurea (HYDREA) 500 MG capsule Take 500 mg by mouth 3 (three) times a week. 5 times weekly or as directed --  May take with food to minimize GI side effects.           insulin NPH-insulin regular (NOVOLIN 70/30) (70-30) 100 UNIT/ML injection Inject 20-58 Units into the skin 2 (two) times daily. Sliding scale           isosorbide mononitrate (IMDUR) 30 MG 24 hr tablet Take 30 mg by mouth daily.             zolpidem (AMBIEN) 10 MG tablet Take 10 mg by mouth at bedtime as needed.             Casanthranol-Docusate Sodium 30-100 MG CAPS Take 1 capsule by mouth daily. Stool Softner           ALPRAZolam (XANAX) 0.25 MG tablet Take 0.25 mg by mouth at  bedtime as needed.             cephALEXin (KEFLEX) 500 MG capsule Take 500 mg by mouth 3 (three) times  daily.           oxyCODONE-acetaminophen (PERCOCET) 10-325 MG per tablet Take 0.5 tablets by mouth every 4 (four) hours as needed. Pain.           allopurinol (ZYLOPRIM) 100 MG tablet Take 100 mg by mouth daily.           Hydrocodone-Acetaminophen 10-660 MG TABS Take 1 tablet by mouth 4 (four) times daily as needed. For pain #30 NR (sent electronically)            Disposition: SNF  Patient's condition: is Good  Follow up: 1. Dr. Darrick Penna  in 1-2 weeks   Doreatha Massed, PA-C [facility] [phone] 09/15/2011  9:35 AM

## 2011-09-15 NOTE — Evaluation (Signed)
Occupational Therapy Evaluation Patient Details Name: Donald Dudley MRN: [id number] DOB: Jun 30, 1940 Today's Date: 09/15/2011 Time: 1350-1410 OT Time Calculation (min): 20 min  OT Assessment / Plan / Recommendation Clinical Impression  Pt admitted for debridement of R LE residual limb.  Pt needs to be able to transfer independently for home.  Plans are for pt to go to [facility] for rehab.  Discharge is pending MD order per pt.  Will defer OT treatment to SNF.    OT Assessment  All further OT needs can be met in the next venue of care    Follow Up Recommendations  Skilled nursing facility    Barriers to Discharge      Equipment Recommendations  Defer to next venue    Recommendations for Other Services    Frequency       Precautions / Restrictions Precautions Precautions: Fall Restrictions Other Position/Activity Restrictions: pt. reports tolerating little wight on right LE due to pain and bone on bone arthritis   Pertinent Vitals/Pain 5/10 after meds, R LE    ADL  Eating/Feeding: Performed;Set up (difficulty opening packages due to arthritis) Where Assessed - Eating/Feeding: Edge of bed Grooming: Performed;Set up;Teeth care;Wash/dry face;Wash/dry hands Where Assessed - Grooming: Unsupported sitting Upper Body Bathing: Simulated;Minimal assistance (for back) Where Assessed - Upper Body Bathing: Unsupported sitting Lower Body Bathing: Simulated;Moderate assistance Where Assessed - Lower Body Bathing: Unsupported sitting Upper Body Dressing: Performed;Set up Where Assessed - Upper Body Dressing: Unsupported sitting Lower Body Dressing: Performed;Moderate assistance Where Assessed - Lower Body Dressing: Lean right and/or left Transfers/Ambulation Related to ADLs: Pt declined transfer to w/c, demonstrated ability to scoot along EOB with min assist (use of pad) simulating scooting transfers.    OT Diagnosis: Generalized weakness;Acute pain  OT Problem List:  Decreased strength;Decreased activity tolerance;Pain;Impaired UE functional use;Decreased coordination OT Treatment Interventions:     OT Goals    Visit Information  Last OT Received On: 09/15/11 Assistance Needed: +1    Subjective Data  Subjective: "I am supposed to go to the nursing home soon." Patient Stated Goal: SNF to regain ability to transfer and heal R LE.   Prior Functioning  Home Living Lives With: Spouse Available Help at Discharge: Skilled Nursing Facility Home Adaptive Equipment: Wheelchair - manual;Walker - rolling;Bedside commode/3-in-1;Shower chair without back Prior Function Level of Independence: Independent with assistive device(s) Driving: No Vocation: Retired Musician: No difficulties Dominant Hand: Right    Cognition  Overall Cognitive Status: Appears within functional limits for tasks assessed/performed Arousal/Alertness: Awake/alert Orientation Level: Oriented X4 / Intact Behavior During Session: Flat affect Cognition - Other Comments: tearful at times    Extremity/Trunk Assessment Right Upper Extremity Assessment RUE ROM/Strength/Tone: WFL for tasks assessed RUE Coordination: Deficits (longstanding difficulty with manipulation due to arthritis) Left Upper Extremity Assessment LUE ROM/Strength/Tone: Within functional levels LUE Coordination: Deficits (longstanding difficulties with manipulation due to arthritis) Trunk Assessment Trunk Assessment: Normal   Mobility Bed Mobility Bed Mobility: Supine to Sit;Sitting - Scoot to Edge of Bed Supine to Sit: 6: Modified independent (Device/Increase time);With rails Sitting - Scoot to Edge of Bed: 6: Modified independent (Device/Increase time);With rail Sit to Supine: 6: Modified independent (Device/Increase time);With rail;HOB elevated   Exercise    Balance Static Sitting Balance Static Sitting - Balance Support: No upper extremity supported;Feet supported Static Sitting - Level of  Assistance: 6: Modified independent (Device/Increase time) Static Sitting - Comment/# of Minutes: 10  End of Session OT - End of Session Activity Tolerance: Patient  limited by pain Patient left: in bed;with call bell/phone within reach;with family/visitor present Nurse Communication: Other (comment) (plan for d/c)   Evern Bio 09/15/2011, 2:21 PM [phone]

## 2011-09-15 NOTE — Progress Notes (Signed)
Pt d/c to [facility] per ambulance. Report called to SNF. Iv d/c'd intact. Belongings with pt. And wife.

## 2011-09-15 NOTE — Clinical Social Work Note (Signed)
Patient discharged to [facility] today. Discharge information forwarded to facility. CSW initially talked with patient, however wife came to hospital later in day through patient discharge. CSW facilitated transportation via ambulance.  Genelle Bal, MSW, LCSW [phone]

## 2011-09-15 NOTE — Telephone Encounter (Addendum)
Message copied by Rosalyn Charters on Mon Sep 15, 2011 11:07 AM ------      Message from: Lorin Mercy K      Created: Mon Sep 15, 2011  9:47 AM      Regarding: schedule                   ----- Message -----         From: Dara Lords, PA         Sent: 09/15/2011   9:38 AM           To: Sharee Pimple, CMA            Odland,Baylon Izola Price, 71 y.o., [birth year]            S/p I&D of Left BKA.             F/u with Dr. Darrick Penna in 1-2 weeks.            Thanks,      Samantha  left message with wife notifying her of fu appt. on 10-02-11 3:15 pm

## 2011-09-15 NOTE — Progress Notes (Signed)
Pt d/c'd to SNF IV d/c'd, belongings with wife. Discharge papers given to EMS, d/c'd tele.

## 2011-09-15 NOTE — Clinical Social Work Placement (Signed)
Clinical Social Work Department CLINICAL SOCIAL WORK PLACEMENT NOTE 09/15/2011  Patient:  Donald Dudley, Donald Dudley  Account Number:  [id number] Admit date:  09/10/2011  Clinical Social Worker:  Genelle Bal, LCSW  Date/time:  09/15/2011 07:07 AM  Clinical Social Work is seeking post-discharge placement for this patient at the following level of care:   SKILLED NURSING   (*CSW will update this form in Epic as items are completed)     Patient/family provided with [facility] Department of Clinical Social Work's list of facilities offering this level of care within the geographic area requested by the patient (or if unable, by the patient's family).  09/14/2011  Patient/family informed of their freedom to choose among providers that offer the needed level of care, that participate in Medicare, Medicaid or managed care program needed by the patient, have an available bed and are willing to accept the patient.    Patient/family informed of [facility]' ownership interest in [facility], as well as of the fact that they are under no obligation to receive care at this facility.  PASARR submitted to [employer] on 07/19/2010 PASARR number received from [employer] on 07/19/2010  FL2 transmitted to all facilities in geographic area requested by pt/family on  09/14/2011 FL2 transmitted to all facilities within larger geographic area on   Patient informed that his/her managed care company has contracts with or will negotiate with  certain facilities, including the following:     Patient/family informed of bed offers received:  09/15/2011 Patient chooses bed at [facility] Physician recommends and patient chooses bed at    Patient to be transferred to [facility] on  09/15/2011 Patient to be transferred to facility by Ambulance  The following physician request were entered in Epic:   Additional Comments: Wife at the bedside and aware of discharge to [facility]

## 2011-09-15 NOTE — Progress Notes (Signed)
Pt refusing Torsemide. States it makes him urinate too much.

## 2011-09-17 LAB — GLUCOSE, CAPILLARY: Glucose-Capillary: 105 mg/dL — ABNORMAL HIGH (ref 70–99)

## 2011-10-01 ENCOUNTER — Encounter: Payer: Self-pay | Admitting: Vascular Surgery

## 2011-10-02 ENCOUNTER — Ambulatory Visit (INDEPENDENT_AMBULATORY_CARE_PROVIDER_SITE_OTHER): Payer: Medicare Other | Admitting: Vascular Surgery

## 2011-10-02 ENCOUNTER — Encounter: Payer: Self-pay | Admitting: Vascular Surgery

## 2011-10-02 VITALS — BP 198/61 | HR 46 | Resp 16 | Ht 68.0 in | Wt 157.0 lb

## 2011-10-02 DIAGNOSIS — L98499 Non-pressure chronic ulcer of skin of other sites with unspecified severity: Secondary | ICD-10-CM

## 2011-10-02 DIAGNOSIS — I739 Peripheral vascular disease, unspecified: Secondary | ICD-10-CM

## 2011-10-02 NOTE — Progress Notes (Signed)
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 He is currently getting around in a wheelchair.  He is doing saline wet to dry dressings of the BKA.  Physical exam: Filed Vitals:   10/02/11 1522  BP: 198/61  Pulse: 46  Resp: 16  Height: 5\' 8"  (1.727 m)  Weight: 157 lb (71.215 kg)   Left below-knee amputation 3 x 1 cm open area with some granulation tissue at the base depth is 1.5 mm  Assessment: Slowly healing left below-knee amputation  Plan: The patient will followup in 3 weeks. He will continue wet to dry dressings twice daily. He probably needs to stay in a skilled nursing facility until we can get this healed. I would not have him where his prosthetic until the wound is completely healed as he would be at risk of breaking down more tissue.  Fabienne Bruns, MD [facility] Office: [phone] Pager: [phone]

## 2011-10-22 ENCOUNTER — Encounter: Payer: Self-pay | Admitting: Vascular Surgery

## 2011-10-23 ENCOUNTER — Encounter: Payer: Self-pay | Admitting: Vascular Surgery

## 2011-10-23 ENCOUNTER — Ambulatory Visit (INDEPENDENT_AMBULATORY_CARE_PROVIDER_SITE_OTHER): Payer: Medicare Other | Admitting: Vascular Surgery

## 2011-10-23 VITALS — BP 170/49 | HR 50 | Resp 16 | Ht 68.0 in | Wt 151.0 lb

## 2011-10-23 DIAGNOSIS — L98499 Non-pressure chronic ulcer of skin of other sites with unspecified severity: Secondary | ICD-10-CM

## 2011-10-23 DIAGNOSIS — I739 Peripheral vascular disease, unspecified: Secondary | ICD-10-CM

## 2011-10-23 NOTE — Progress Notes (Signed)
[facility]ATIVE OFFICE NOTE  CC:  F/u for surgery  HPI:  This is a 71 y.o. male who is s/p incision and drainage of left BKA on 09/10/11.  His family states that it has closed up a considerable amount.  He denies any fevers and has no other complaints except feelings of being down and is tearful today.  States tomorrow is his last day at the SNF.  Allergies  Allergen Reactions  . Amiodarone Swelling  . Colchicine   . Crestor (Rosuvastatin Calcium)   . Lipitor (Atorvastatin Calcium)   . Lisinopril   . Lyrica (Pregabalin) Swelling  . Mevacor (Lovastatin)     GI-bleed   . Micardis (Telmisartan)   . Neurontin (Gabapentin)   . Norvasc (Amlodipine Besylate)   . Potassium Chloride Er   . Ranolazine Er   . Zocor (Simvastatin - High Dose)     Current Outpatient Prescriptions  Medication Sig Dispense Refill  . allopurinol (ZYLOPRIM) 100 MG tablet Take 100 mg by mouth daily.      Marland Kitchen ALPRAZolam (XANAX) 0.25 MG tablet Take 0.25 mg by mouth at bedtime as needed.        Marland Kitchen atenolol (TENORMIN) 100 MG tablet Take 100 mg by mouth daily.       Marland Kitchen BAYER CONTOUR TEST test strip as needed.      . bisacodyl (DULCOLAX) 10 MG suppository Place 10 mg rectally as needed.      Jennette Banker Sodium 30-100 MG CAPS Take 1 capsule by mouth daily. Stool Softner      . cephALEXin (KEFLEX) 500 MG capsule Take 500 mg by mouth 3 (three) times daily.      . hydrocodone-acetaminophen (LORCET PLUS) 7.5-650 MG per tablet Take 1 tablet by mouth every 6 (six) hours as needed.      . hydroxyurea (HYDREA) 500 MG capsule Take 500 mg by mouth 3 (three) times a week. 5 times weekly or as directed --  May take with food to minimize GI side effects.      . insulin NPH-insulin regular (NOVOLIN 70/30) (70-30) 100 UNIT/ML injection Inject 20-58 Units into the skin 2 (two) times daily. Sliding scale      . isosorbide mononitrate (IMDUR) 30 MG 24 hr tablet Take 30 mg by mouth daily.        .  Lancets 28G MISC as needed.      . senna (SENOKOT) 8.6 MG TABS Take 1 tablet by mouth 2 (two) times daily.      . Tamsulosin HCl (FLOMAX) 0.4 MG CAPS Take 0.4 mg by mouth daily.        Marland Kitchen torsemide (DEMADEX) 20 MG tablet Take 20 mg by mouth daily.       Marland Kitchen triamterene-hydrochlorothiazide (MAXZIDE) 75-50 MG per tablet Take 1 tablet by mouth daily.        . hydrALAZINE (APRESOLINE) 10 MG tablet Take 10 mg by mouth every 6 (six) hours as needed.      . Hydrocodone-Acetaminophen 10-660 MG TABS Take 1 tablet by mouth 4 (four) times daily as needed. For pain  30 each  0  . Lactulose 20 GM/30ML SOLN Take by mouth.      . Multiple Vitamins-Minerals (MULTIVITAMIN WITH MINERALS) tablet Take 1 tablet by mouth daily.      Marland Kitchen oxyCODONE-acetaminophen (PERCOCET) 10-325 MG per tablet Take 0.5 tablets by mouth every 4 (four) hours as needed. Pain.      . vitamin C (ASCORBIC ACID) 500  MG tablet Take 500 mg by mouth 2 (two) times daily.      Marland Kitchen zolpidem (AMBIEN) 10 MG tablet Take 10 mg by mouth at bedtime as needed.           ROS:  See HPI  Physical Exam:  Filed Vitals:   10/23/11 1250  BP: 170/49  Pulse: 50  Resp: 16    Incision:  Left BKA wound is healing nicely with minimal serous drainage.  His wound measures 4cm x 0.5cm   A/P:  This is a 71 y.o. male here for f/u for drainage of left BKA  -wound is healing nicely.  Continue current care.  Pt has good family support -Rx given for right leg brace due to his arthritis to help with mobilization. -pt will call if [facility] does not provide him with PT b/c the pt will continue to need PT.  Doreatha Massed, PA-C [facility] [phone]  Clinic MD:  Seen and examined in conjunction with Dr. Darrick Penna

## 2011-11-05 ENCOUNTER — Telehealth: Payer: Self-pay | Admitting: Cardiovascular Disease

## 2011-11-05 NOTE — Telephone Encounter (Signed)
Spoke with physical therapist, pts vitals normal other than low heart rate, pt feels fine and states this has been this way for awhile. Dr Elease Hashimoto aware and said its ok as long as he feels ok. Pt to call if any problems, pt had confirmed to PT that he feels fine.

## 2011-11-05 NOTE — Telephone Encounter (Signed)
Pt heart rate is 46 and he is telling her we know this and we are aware of that and she wants to make sure and make sure that patient doesn't have any exercise restrictions

## 2011-12-03 ENCOUNTER — Encounter: Payer: Self-pay | Admitting: Vascular Surgery

## 2011-12-04 ENCOUNTER — Encounter: Payer: Self-pay | Admitting: Vascular Surgery

## 2011-12-04 ENCOUNTER — Ambulatory Visit (INDEPENDENT_AMBULATORY_CARE_PROVIDER_SITE_OTHER): Payer: Medicare Other | Admitting: Vascular Surgery

## 2011-12-04 VITALS — BP 166/69 | HR 91 | Resp 20 | Ht 68.0 in | Wt 150.0 lb

## 2011-12-04 DIAGNOSIS — I739 Peripheral vascular disease, unspecified: Secondary | ICD-10-CM

## 2011-12-04 DIAGNOSIS — L98499 Non-pressure chronic ulcer of skin of other sites with unspecified severity: Secondary | ICD-10-CM

## 2011-12-04 DIAGNOSIS — I70209 Unspecified atherosclerosis of native arteries of extremities, unspecified extremity: Secondary | ICD-10-CM

## 2011-12-04 NOTE — Progress Notes (Signed)
Patient returns today for followup check his left below-knee amputation recent I&D. The wound is essentially healed at this point. The patient also complains of 2 ulcers on his left hand. He states that this is an area that he thought was gallop in that he has picked at somewhat. He has developed redness and drainage.   Review of systems: He denies fever or chills. He denies shortness of breath or chest pain Physical exam:  Filed Vitals:   12/04/11 1529  BP: 166/69  Pulse: 91  Resp: 20  Height: 5\' 8"  (1.727 m)  Weight: 150 lb (68.04 kg)   Left below-knee amputation incision completely healed Left hand 2+ radial and ulnar pulse, 2 cm ulceration on the radial aspect of the mid phalanx of the second and third digit there is some surrounding erythema. There is yellowish to clear drainage from the second digit.  Assessment: Healed below-knee amputation.  Ulcer left hand possible early infection  Plan: The patient will followup with his prosthetist to begin starting training for a left leg prosthetic. The patient was placed on Keflex 500 mg 3 times a day today if his symptoms in his hand and not improved within a few days he will call his hand surgeon Dr. Melvyn Novas. The patient will followup with me on as-needed basis.

## 2012-04-09 ENCOUNTER — Inpatient Hospital Stay (HOSPITAL_COMMUNITY): Payer: Medicare Other

## 2012-04-09 ENCOUNTER — Encounter (HOSPITAL_COMMUNITY): Payer: Self-pay | Admitting: Anesthesiology

## 2012-04-09 ENCOUNTER — Encounter (HOSPITAL_COMMUNITY): Payer: Self-pay | Admitting: Surgery

## 2012-04-09 ENCOUNTER — Inpatient Hospital Stay (HOSPITAL_COMMUNITY): Payer: Medicare Other | Admitting: Anesthesiology

## 2012-04-09 ENCOUNTER — Encounter (HOSPITAL_COMMUNITY): Payer: Self-pay | Admitting: Pharmacy Technician

## 2012-04-09 ENCOUNTER — Encounter (HOSPITAL_COMMUNITY)
Admission: RE | Disposition: A | Discharge status: Home Health | Payer: Self-pay | Source: Ambulatory Visit | Attending: Orthopedic Surgery

## 2012-04-09 ENCOUNTER — Inpatient Hospital Stay (HOSPITAL_COMMUNITY)
Admission: RE | Admit: 2012-04-09 | Discharge: 2012-04-13 | DRG: 464 | Disposition: A | Discharge status: Home Health | Payer: Medicare Other | Source: Ambulatory Visit | Attending: Orthopedic Surgery | Admitting: Orthopedic Surgery

## 2012-04-09 DIAGNOSIS — I252 Old myocardial infarction: Secondary | ICD-10-CM

## 2012-04-09 DIAGNOSIS — S88119A Complete traumatic amputation at level between knee and ankle, unspecified lower leg, initial encounter: Secondary | ICD-10-CM

## 2012-04-09 DIAGNOSIS — Z79899 Other long term (current) drug therapy: Secondary | ICD-10-CM

## 2012-04-09 DIAGNOSIS — R350 Frequency of micturition: Secondary | ICD-10-CM | POA: Diagnosis present

## 2012-04-09 DIAGNOSIS — M1A9XX1 Chronic gout, unspecified, with tophus (tophi): Principal | ICD-10-CM | POA: Diagnosis present

## 2012-04-09 DIAGNOSIS — L089 Local infection of the skin and subcutaneous tissue, unspecified: Secondary | ICD-10-CM

## 2012-04-09 DIAGNOSIS — N401 Enlarged prostate with lower urinary tract symptoms: Secondary | ICD-10-CM | POA: Diagnosis present

## 2012-04-09 DIAGNOSIS — K5900 Constipation, unspecified: Secondary | ICD-10-CM | POA: Diagnosis present

## 2012-04-09 DIAGNOSIS — Z9861 Coronary angioplasty status: Secondary | ICD-10-CM

## 2012-04-09 DIAGNOSIS — C9110 Chronic lymphocytic leukemia of B-cell type not having achieved remission: Secondary | ICD-10-CM | POA: Diagnosis present

## 2012-04-09 DIAGNOSIS — I509 Heart failure, unspecified: Secondary | ICD-10-CM | POA: Diagnosis present

## 2012-04-09 DIAGNOSIS — G609 Hereditary and idiopathic neuropathy, unspecified: Secondary | ICD-10-CM | POA: Diagnosis present

## 2012-04-09 DIAGNOSIS — Z87891 Personal history of nicotine dependence: Secondary | ICD-10-CM

## 2012-04-09 DIAGNOSIS — I739 Peripheral vascular disease, unspecified: Secondary | ICD-10-CM | POA: Diagnosis present

## 2012-04-09 DIAGNOSIS — I209 Angina pectoris, unspecified: Secondary | ICD-10-CM | POA: Diagnosis present

## 2012-04-09 DIAGNOSIS — D72829 Elevated white blood cell count, unspecified: Secondary | ICD-10-CM | POA: Diagnosis present

## 2012-04-09 DIAGNOSIS — Z96659 Presence of unspecified artificial knee joint: Secondary | ICD-10-CM

## 2012-04-09 DIAGNOSIS — F411 Generalized anxiety disorder: Secondary | ICD-10-CM | POA: Diagnosis present

## 2012-04-09 DIAGNOSIS — I4891 Unspecified atrial fibrillation: Secondary | ICD-10-CM

## 2012-04-09 DIAGNOSIS — N183 Chronic kidney disease, stage 3 unspecified: Secondary | ICD-10-CM

## 2012-04-09 DIAGNOSIS — I2510 Atherosclerotic heart disease of native coronary artery without angina pectoris: Secondary | ICD-10-CM

## 2012-04-09 DIAGNOSIS — Z794 Long term (current) use of insulin: Secondary | ICD-10-CM

## 2012-04-09 DIAGNOSIS — I129 Hypertensive chronic kidney disease with stage 1 through stage 4 chronic kidney disease, or unspecified chronic kidney disease: Secondary | ICD-10-CM | POA: Diagnosis present

## 2012-04-09 DIAGNOSIS — M1990 Unspecified osteoarthritis, unspecified site: Secondary | ICD-10-CM | POA: Diagnosis present

## 2012-04-09 DIAGNOSIS — N138 Other obstructive and reflux uropathy: Secondary | ICD-10-CM | POA: Diagnosis present

## 2012-04-09 DIAGNOSIS — I10 Essential (primary) hypertension: Secondary | ICD-10-CM | POA: Diagnosis present

## 2012-04-09 DIAGNOSIS — E119 Type 2 diabetes mellitus without complications: Secondary | ICD-10-CM

## 2012-04-09 DIAGNOSIS — E785 Hyperlipidemia, unspecified: Secondary | ICD-10-CM | POA: Diagnosis present

## 2012-04-09 DIAGNOSIS — Z951 Presence of aortocoronary bypass graft: Secondary | ICD-10-CM

## 2012-04-09 HISTORY — DX: Zoster without complications: B02.9

## 2012-04-09 HISTORY — DX: Angina pectoris, unspecified: I20.9

## 2012-04-09 HISTORY — PX: I & D EXTREMITY: SHX5045

## 2012-04-09 LAB — SURGICAL PCR SCREEN: MRSA, PCR: POSITIVE — AB

## 2012-04-09 LAB — CBC
MCH: 25.5 pg — ABNORMAL LOW (ref 26.0–34.0)
MCHC: 28.9 g/dL — ABNORMAL LOW (ref 30.0–36.0)
MCV: 88.3 fL (ref 78.0–100.0)
Platelets: 123 10*3/uL — ABNORMAL LOW (ref 150–400)
RBC: 5.65 MIL/uL (ref 4.22–5.81)

## 2012-04-09 LAB — BASIC METABOLIC PANEL
CO2: 24 mEq/L (ref 19–32)
Calcium: 9.1 mg/dL (ref 8.4–10.5)
Creatinine, Ser: 1.3 mg/dL (ref 0.50–1.35)
GFR calc non Af Amer: 53 mL/min — ABNORMAL LOW (ref >90)

## 2012-04-09 LAB — GLUCOSE, CAPILLARY
Glucose-Capillary: 104 mg/dL — ABNORMAL HIGH (ref 70–99)
Glucose-Capillary: 85 mg/dL (ref 70–99)

## 2012-04-09 SURGERY — IRRIGATION AND DEBRIDEMENT EXTREMITY
Anesthesia: General | Site: Finger | Laterality: Bilateral | Wound class: Dirty or Infected

## 2012-04-09 MED ORDER — TRIAMTERENE-HCTZ 75-50 MG PO TABS
1.0000 | ORAL_TABLET | Freq: Every day | ORAL | Status: DC
  Filled 2012-04-09: qty 1

## 2012-04-09 MED ORDER — LACTATED RINGERS IV SOLN
INTRAVENOUS | Status: DC
  Administered 2012-04-10 – 2012-04-12 (×3): via INTRAVENOUS

## 2012-04-09 MED ORDER — METHOCARBAMOL 500 MG PO TABS
500.0000 mg | ORAL_TABLET | Freq: Four times a day (QID) | ORAL | Status: DC | PRN
  Administered 2012-04-10 – 2012-04-13 (×9): 500 mg via ORAL
  Filled 2012-04-09 (×11): qty 1

## 2012-04-09 MED ORDER — ONDANSETRON HCL 4 MG/2ML IJ SOLN: 4.0000 mg | Freq: Four times a day (QID) | INTRAMUSCULAR | Status: DC | PRN

## 2012-04-09 MED ORDER — ONDANSETRON HCL 4 MG PO TABS: 4.0000 mg | ORAL_TABLET | Freq: Four times a day (QID) | ORAL | Status: DC | PRN

## 2012-04-09 MED ORDER — ISOSORBIDE MONONITRATE ER 30 MG PO TB24
30.0000 mg | ORAL_TABLET | Freq: Every evening | ORAL | Status: DC
  Administered 2012-04-10 – 2012-04-12 (×4): 30 mg via ORAL
  Filled 2012-04-09 (×6): qty 1

## 2012-04-09 MED ORDER — PROMETHAZINE HCL 25 MG/ML IJ SOLN: 6.2500 mg | INTRAMUSCULAR | Status: DC | PRN

## 2012-04-09 MED ORDER — HYDROMORPHONE HCL PF 1 MG/ML IJ SOLN
0.5000 mg | INTRAMUSCULAR | Status: DC | PRN
Start: 2012-04-09 — End: 2012-04-13
  Administered 2012-04-10 – 2012-04-13 (×20): 1 mg via INTRAVENOUS
  Filled 2012-04-09 (×20): qty 1

## 2012-04-09 MED ORDER — ALLOPURINOL 100 MG PO TABS
100.0000 mg | ORAL_TABLET | Freq: Every day | ORAL | Status: DC
  Administered 2012-04-10 – 2012-04-13 (×4): 100 mg via ORAL
  Filled 2012-04-09 (×4): qty 1

## 2012-04-09 MED ORDER — VANCOMYCIN HCL IN DEXTROSE 1-5 GM/200ML-% IV SOLN
1000.0000 mg | INTRAVENOUS | Status: AC
  Administered 2012-04-09: 1000 mg via INTRAVENOUS
  Filled 2012-04-09: qty 200

## 2012-04-09 MED ORDER — VITAMIN C 500 MG PO TABS
1000.0000 mg | ORAL_TABLET | Freq: Every day | ORAL | Status: DC
  Administered 2012-04-13: 1000 mg via ORAL
  Filled 2012-04-09 (×4): qty 2

## 2012-04-09 MED ORDER — TAMSULOSIN HCL 0.4 MG PO CAPS
0.4000 mg | ORAL_CAPSULE | Freq: Every day | ORAL | Status: DC
  Administered 2012-04-10 – 2012-04-13 (×4): 0.4 mg via ORAL
  Filled 2012-04-09 (×4): qty 1

## 2012-04-09 MED ORDER — DOCUSATE SODIUM 100 MG PO CAPS
100.0000 mg | ORAL_CAPSULE | Freq: Two times a day (BID) | ORAL | Status: DC
  Administered 2012-04-13: 100 mg via ORAL
  Filled 2012-04-09 (×6): qty 1

## 2012-04-09 MED ORDER — OXYCODONE HCL 5 MG/5ML PO SOLN: 5.0000 mg | Freq: Once | ORAL | Status: DC | PRN

## 2012-04-09 MED ORDER — OXYCODONE HCL 5 MG PO TABS: 5.0000 mg | ORAL_TABLET | Freq: Once | ORAL | Status: DC | PRN

## 2012-04-09 MED ORDER — HYDROMORPHONE HCL PF 1 MG/ML IJ SOLN
0.2500 mg | INTRAMUSCULAR | Status: DC | PRN
  Administered 2012-04-09 (×4): 0.5 mg via INTRAVENOUS

## 2012-04-09 MED ORDER — HYDROMORPHONE HCL PF 1 MG/ML IJ SOLN
INTRAMUSCULAR | Status: AC
  Filled 2012-04-09: qty 1

## 2012-04-09 MED ORDER — HYDROXYUREA 500 MG PO CAPS
500.0000 mg | ORAL_CAPSULE | ORAL | Status: DC
  Administered 2012-04-12: 500 mg via ORAL
  Filled 2012-04-09 (×3): qty 1

## 2012-04-09 MED ORDER — OXYCODONE-ACETAMINOPHEN 5-325 MG PO TABS
1.0000 | ORAL_TABLET | ORAL | Status: DC | PRN
  Administered 2012-04-10 – 2012-04-13 (×9): 2 via ORAL
  Filled 2012-04-09 (×9): qty 2

## 2012-04-09 MED ORDER — ALPRAZOLAM 0.25 MG PO TABS
0.2500 mg | ORAL_TABLET | ORAL | Status: DC
  Administered 2012-04-10 – 2012-04-13 (×21): 0.25 mg via ORAL
  Filled 2012-04-09 (×21): qty 1

## 2012-04-09 MED ORDER — LIDOCAINE HCL (CARDIAC) 20 MG/ML IV SOLN
INTRAVENOUS | Status: DC | PRN
  Administered 2012-04-09: 100 mg via INTRAVENOUS

## 2012-04-09 MED ORDER — SODIUM CHLORIDE 0.9 % IR SOLN
Status: DC | PRN
  Administered 2012-04-09: 4000 mL

## 2012-04-09 MED ORDER — FENTANYL CITRATE 0.05 MG/ML IJ SOLN
50.0000 ug | Freq: Once | INTRAMUSCULAR | Status: AC
  Administered 2012-04-09 (×2): 50 ug via INTRAVENOUS

## 2012-04-09 MED ORDER — ALPRAZOLAM 0.25 MG PO TABS: 0.2500 mg | ORAL_TABLET | ORAL | Status: DC

## 2012-04-09 MED ORDER — MIDAZOLAM HCL 5 MG/5ML IJ SOLN
INTRAMUSCULAR | Status: DC | PRN
  Administered 2012-04-09: 2 mg via INTRAVENOUS

## 2012-04-09 MED ORDER — CHLORHEXIDINE GLUCONATE 4 % EX LIQD: 60.0000 mL | Freq: Once | CUTANEOUS | Status: DC

## 2012-04-09 MED ORDER — PROPOFOL 10 MG/ML IV BOLUS
INTRAVENOUS | Status: DC | PRN
  Administered 2012-04-09: 80 mg via INTRAVENOUS

## 2012-04-09 MED ORDER — HYDROCODONE-ACETAMINOPHEN 7.5-325 MG PO TABS
1.0000 | ORAL_TABLET | ORAL | Status: DC | PRN
  Administered 2012-04-10 – 2012-04-13 (×2): 2 via ORAL
  Filled 2012-04-09 (×3): qty 2

## 2012-04-09 MED ORDER — ATENOLOL 100 MG PO TABS
100.0000 mg | ORAL_TABLET | Freq: Every day | ORAL | Status: DC
  Administered 2012-04-10 – 2012-04-13 (×4): 100 mg via ORAL
  Filled 2012-04-09 (×4): qty 1

## 2012-04-09 MED ORDER — MUPIROCIN 2 % EX OINT
TOPICAL_OINTMENT | Freq: Two times a day (BID) | CUTANEOUS | Status: DC
  Administered 2012-04-09: 1 via NASAL
  Administered 2012-04-10 – 2012-04-13 (×8): via NASAL
  Filled 2012-04-09: qty 22

## 2012-04-09 MED ORDER — DIPHENHYDRAMINE HCL 25 MG PO CAPS: 25.0000 mg | ORAL_CAPSULE | Freq: Four times a day (QID) | ORAL | Status: DC | PRN

## 2012-04-09 MED ORDER — METHOCARBAMOL 100 MG/ML IJ SOLN
500.0000 mg | Freq: Four times a day (QID) | INTRAVENOUS | Status: DC | PRN
  Administered 2012-04-09: 500 mg via INTRAVENOUS
  Filled 2012-04-09: qty 5

## 2012-04-09 MED ORDER — FENTANYL CITRATE 0.05 MG/ML IJ SOLN
INTRAMUSCULAR | Status: AC
  Filled 2012-04-09: qty 2

## 2012-04-09 MED ORDER — ALLOPURINOL 100 MG PO TABS: 100.0000 mg | ORAL_TABLET | Freq: Every day | ORAL | Status: DC

## 2012-04-09 MED ORDER — MIDAZOLAM HCL 2 MG/2ML IJ SOLN: 1.0000 mg | INTRAMUSCULAR | Status: DC | PRN

## 2012-04-09 MED ORDER — ONDANSETRON HCL 4 MG/2ML IJ SOLN
INTRAMUSCULAR | Status: DC | PRN
  Administered 2012-04-09: 4 mg via INTRAVENOUS

## 2012-04-09 MED ORDER — FENTANYL CITRATE 0.05 MG/ML IJ SOLN
INTRAMUSCULAR | Status: DC | PRN
  Administered 2012-04-09: 50 ug via INTRAVENOUS
  Administered 2012-04-09 (×3): 100 ug via INTRAVENOUS

## 2012-04-09 MED ORDER — SODIUM CHLORIDE 0.9 % IV SOLN
INTRAVENOUS | Status: DC | PRN
  Administered 2012-04-09 (×2): via INTRAVENOUS

## 2012-04-09 MED ORDER — TORSEMIDE 20 MG PO TABS
20.0000 mg | ORAL_TABLET | Freq: Every day | ORAL | Status: DC
  Administered 2012-04-13: 20 mg via ORAL
  Filled 2012-04-09 (×6): qty 1

## 2012-04-09 MED ORDER — INSULIN ASPART 100 UNIT/ML ~~LOC~~ SOLN
0.0000 [IU] | Freq: Three times a day (TID) | SUBCUTANEOUS | Status: DC
  Administered 2012-04-11: 2 [IU] via SUBCUTANEOUS
  Administered 2012-04-12: 3 [IU] via SUBCUTANEOUS

## 2012-04-09 SURGICAL SUPPLY — 58 items
BANDAGE CONFORM 2  STR LF (GAUZE/BANDAGES/DRESSINGS) ×4 IMPLANT
BANDAGE ELASTIC 3 VELCRO ST LF (GAUZE/BANDAGES/DRESSINGS) ×1 IMPLANT
BANDAGE ELASTIC 4 VELCRO ST LF (GAUZE/BANDAGES/DRESSINGS) ×1 IMPLANT
BANDAGE GAUZE ELAST BULKY 4 IN (GAUZE/BANDAGES/DRESSINGS) ×3 IMPLANT
BNDG CMPR 9X4 STRL LF SNTH (GAUZE/BANDAGES/DRESSINGS) ×1
BNDG COHESIVE 1X5 TAN STRL LF (GAUZE/BANDAGES/DRESSINGS) ×4 IMPLANT
BNDG ESMARK 4X9 LF (GAUZE/BANDAGES/DRESSINGS) ×2 IMPLANT
CLOTH BEACON ORANGE TIMEOUT ST (SAFETY) ×2 IMPLANT
CORDS BIPOLAR (ELECTRODE) ×2 IMPLANT
COVER SURGICAL LIGHT HANDLE (MISCELLANEOUS) ×2 IMPLANT
CUFF TOURNIQUET SINGLE 18IN (TOURNIQUET CUFF) ×3 IMPLANT
CUFF TOURNIQUET SINGLE 24IN (TOURNIQUET CUFF) IMPLANT
DRAIN PENROSE 1/4X12 LTX STRL (WOUND CARE) IMPLANT
DRAPE SURG 17X23 STRL (DRAPES) ×3 IMPLANT
DRSG ADAPTIC 3X8 NADH LF (GAUZE/BANDAGES/DRESSINGS) ×1 IMPLANT
DRSG EMULSION OIL 3X3 NADH (GAUZE/BANDAGES/DRESSINGS) ×2 IMPLANT
ELECT REM PT RETURN 9FT ADLT (ELECTROSURGICAL)
ELECTRODE REM PT RTRN 9FT ADLT (ELECTROSURGICAL) IMPLANT
GAUZE XEROFORM 1X8 LF (GAUZE/BANDAGES/DRESSINGS) ×1 IMPLANT
GAUZE XEROFORM 5X9 LF (GAUZE/BANDAGES/DRESSINGS) IMPLANT
GLOVE BIOGEL PI IND STRL 8.5 (GLOVE) ×1 IMPLANT
GLOVE BIOGEL PI INDICATOR 8.5 (GLOVE) ×2
GLOVE ORTHO TXT STRL SZ7.5 (GLOVE) ×1 IMPLANT
GLOVE SURG ORTHO 7.0 STRL STRW (GLOVE) ×1 IMPLANT
GLOVE SURG ORTHO 8.0 STRL STRW (GLOVE) ×3 IMPLANT
GOWN PREVENTION PLUS XLARGE (GOWN DISPOSABLE) ×3 IMPLANT
GOWN STRL NON-REIN LRG LVL3 (GOWN DISPOSABLE) ×4 IMPLANT
HANDPIECE INTERPULSE COAX TIP (DISPOSABLE)
IV NS IRRIG 3000ML ARTHROMATIC (IV SOLUTION) ×2 IMPLANT
KIT BASIN OR (CUSTOM PROCEDURE TRAY) ×2 IMPLANT
KIT ROOM TURNOVER OR (KITS) ×2 IMPLANT
MANIFOLD NEPTUNE II (INSTRUMENTS) ×2 IMPLANT
NDL HYPO 25GX1X1/2 BEV (NEEDLE) IMPLANT
NEEDLE HYPO 25GX1X1/2 BEV (NEEDLE) IMPLANT
NS IRRIG 1000ML POUR BTL (IV SOLUTION) ×2 IMPLANT
PACK ORTHO EXTREMITY (CUSTOM PROCEDURE TRAY) ×2 IMPLANT
PAD ARMBOARD 7.5X6 YLW CONV (MISCELLANEOUS) ×3 IMPLANT
PAD CAST 4YDX4 CTTN HI CHSV (CAST SUPPLIES) ×1 IMPLANT
PADDING CAST COTTON 4X4 STRL (CAST SUPPLIES)
SET HNDPC FAN SPRY TIP SCT (DISPOSABLE) IMPLANT
SOAP 2 % CHG 4 OZ (WOUND CARE) ×3 IMPLANT
SPONGE GAUZE 4X4 12PLY (GAUZE/BANDAGES/DRESSINGS) ×3 IMPLANT
SPONGE LAP 18X18 X RAY DECT (DISPOSABLE) ×1 IMPLANT
SPONGE LAP 4X18 X RAY DECT (DISPOSABLE) ×2 IMPLANT
SUCTION FRAZIER TIP 10 FR DISP (SUCTIONS) ×2 IMPLANT
SUT ETHILON 4 0 PS 2 18 (SUTURE) IMPLANT
SUT ETHILON 5 0 P 3 18 (SUTURE)
SUT NYLON ETHILON 5-0 P-3 1X18 (SUTURE) ×1 IMPLANT
SUT PROLENE 4 0 PS 2 18 (SUTURE) ×2 IMPLANT
SYR CONTROL 10ML LL (SYRINGE) IMPLANT
TOWEL OR 17X24 6PK STRL BLUE (TOWEL DISPOSABLE) ×2 IMPLANT
TOWEL OR 17X26 10 PK STRL BLUE (TOWEL DISPOSABLE) ×2 IMPLANT
TUBE ANAEROBIC SPECIMEN COL (MISCELLANEOUS) ×1 IMPLANT
TUBE CONNECTING 12X1/4 (SUCTIONS) ×2 IMPLANT
TUBING CYSTO DISP (UROLOGICAL SUPPLIES) ×1 IMPLANT
UNDERPAD 30X30 INCONTINENT (UNDERPADS AND DIAPERS) ×3 IMPLANT
WATER STERILE IRR 1000ML POUR (IV SOLUTION) ×1 IMPLANT
YANKAUER SUCT BULB TIP NO VENT (SUCTIONS) ×2 IMPLANT

## 2012-04-09 NOTE — OR Nursing (Signed)
Surgery to right finger ended at 2040. Surgery began to left finger at 2046 and ended at 2100.

## 2012-04-09 NOTE — Preoperative (Signed)
Beta Blockers   Reason not to administer Beta Blockers:Not Applicable 

## 2012-04-09 NOTE — Transfer of Care (Signed)
Immediate Anesthesia Transfer of Care Note  Patient: Donald Dudley  Procedure(s) Performed: Procedure(s) (LRB) with comments: IRRIGATION AND DEBRIDEMENT EXTREMITY (Bilateral)  Patient Location: PACU  Anesthesia Type:General  Level of Consciousness: awake and alert   Airway & Oxygen Therapy: Patient Spontanous Breathing and Patient connected to nasal cannula oxygen  Post-op Assessment: Report given to PACU RN and Post -op Vital signs reviewed and stable  Post vital signs: Reviewed and stable  Complications: No apparent anesthesia complications

## 2012-04-09 NOTE — Brief Op Note (Signed)
04/09/2012  8:04 PM  PATIENT:  Donald Dudley  71 y.o. male  PRE-OPERATIVE DIAGNOSIS:  Infected Right Long finger Infected Left Index finger  POST-OPERATIVE DIAGNOSIS:  same  PROCEDURE:  Procedure(s) (LRB) with comments: IRRIGATION AND DEBRIDEMENT EXTREMITY (Bilateral) right long finger and left index finger  SURGEON:  Surgeon(s) and Role:    * Sharma Covert, MD - Primary  PHYSICIAN ASSISTANT:   ASSISTANTS: none   ANESTHESIA:   general  EBL:     BLOOD ADMINISTERED:none  DRAINS: none   LOCAL MEDICATIONS USED:  NONE  SPECIMEN:  No Specimen  DISPOSITION OF SPECIMEN:  N/A  COUNTS:  YES  TOURNIQUET:  * Missing tourniquet times found for documented tourniquets in log:  [id number] *  DICTATION: .Other Dictation: Dictation Number [id number]  PLAN OF CARE: Admit to inpatient   PATIENT DISPOSITION:  PACU - hemodynamically stable.   Delay start of Pharmacological VTE agent (>24hrs) due to surgical blood loss or risk of bleeding: not applicable

## 2012-04-09 NOTE — Consult Note (Addendum)
[facility] Medical Consultation  LENNON BOUTWELL ZOX:[id number] DOB: 1940/08/22 DOA: 04/09/2012 PCP: Lorenda Peck, MD   Requesting physician: ED Date of consultation: 04/09/12 Reason for consultation: Medical management  Impression/Recommendations Principal Problem:  *Finger infection Active Problems:  A-fib  CAD (coronary artery disease)  HTN (hypertension)  DM2 (diabetes mellitus, type 2)  CKD (chronic kidney disease) stage 3, GFR 30-59 ml/min    1. Finger infection - POD #0 of I+D of L index and R middle fingers by orthopaedic surgery, patient is on vancomycin started pre-operatively today, pharmacy is dosing this.  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  Given his leukocytosis, the 2 different geographic but simultaneous sites of infection, and the known propensity of MRSA to cause seeding in blood stream, will go ahead and order blood cultures as well. 2. A.Fib - continue home rate control meds, patient is not on coumadin for this, he is followed by a cardiologist as an outpatient. 3. Leukocytosis - secondary to #1 4. CAD - chronic and stable 5. DM2 - will hold off on his 70/30 "sliding scale" he takes at home and instead put him on a med dose SSI while an inpatient here, carb modified medium diet. 6. CKD stage 3 - chronic and stable, his GFR today actually appears to be significantly better than his usual baseline, recheck BMP in AM. 7. HTN - continue home meds, SBP elevated 150s-180s this evening but the 180 reading was after they brought patient up stairs while he was still in significant pain he states, continue to monitor closely, add short acting PRN labetalol if his BP continues to be high.  Our team will followup again tomorrow. Please contact me if I can be of assistance in the meanwhile. Thank you for this consultation.  Chief Complaint: Finger  infections  HPI:  Mr. Smola is a 71 yo M POD #0 of I+D of his L index and R middle (long) fingers which had become infected.  He states the infections started as "pimples", burst, then continued to not improve and get larger over time.  He describes the drainage he was having as thick, yellow drainage.  He states the infections were very painful.  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  Cultures of the wounds were taken and are pending, the patient is currently on vancomycin IV.  His initial lab work up is remarkable for a BGL of 84, and a WBC of 14.2.  Hospitalist has been asked to consult for medical management.  Review of Systems:  12 systems reviewed and otherwise negative.  Past Medical History  Diagnosis Date  . S/P BKA (below knee amputation)   . History of cholecystectomy   . Hx of CABG   . PVD (peripheral vascular disease)   . Peripheral neuropathy   . Dyslipidemia   . Arthritis   . Hyperlipidemia   . Hypercholesterolemia   . Chest pain   . CAD (coronary artery disease)     CABG - 1993  /  PTCA - 2004  /  Cath - 2006  . Gout     takes Allopurinol daily  . Chronic kidney disease, stage III (moderate)   . CLL (chronic lymphocytic leukemia)   . CHF (  congestive heart failure)   . Bilateral renal artery stenosis     status post stents  . Polycythemia   . Hypertension     takes Atenolol,Maxzide,and Isosorbide daily  . Atrial fibrillation     not on Coumadin  . MI (myocardial infarction) 1991  . Bruises easily   . Constipation     related to pain meds-takes OTC stool softener daily  . Peripheral edema     takes Torsemide prn  . Urinary frequency   . Enlarged prostate     takes flomax daily  . Diabetes mellitus     takes Novolin 70/30;average fasting 100-120  . Anxiety     takes Xanax daily  . Insomnia     Ambien prn;but none if over  a yr  . Anginal pain     Take isosorbide  . Shingles rash    Past Surgical History  Procedure Date  . Coronary angioplasty with stent placement 2004    second diagonal artery -- [name], M.D.   . Cardiac catheterization 2006    Est. EF of 65% -- Diffuse coronary artery disease.  The second diagonal artery that was angioplastied in 2004 is now occluded.  It is no longer a candidate for PTCA since it is now flush occluded.  Normal LV systolic function.  [name], M.D.  . Renal artery stent 2003    Bilateral renal artery stenosis  . Total knee arthroplasty     left  . Distal clavicle excision 2003    SURGEON:  [name], M.D.  . Tendon repair     Left Achilles repair x2  . Coronary artery bypass graft 1993    left internal mammary artery to his LAD artery --   . Coronary artery bypass graft 1994    vein graft failure of in his right coronary artery  . Colonoscopy   . Esophagogastroduodenoscopy   . I&d extremity 09/10/2011    Procedure: IRRIGATION AND DEBRIDEMENT EXTREMITY;  Surgeon: Sherren Kerns, MD;  Location: [facility] OR;  Service: Vascular;  Laterality: Left;  OF BKA  . Pr vein bypass graft,aorto-fem-pop   . Joint replacement 1998    Left knee  . Below knee leg amputation     left  . Cholecystectomy    Social History:  reports that he quit smoking about 36 years ago. His smoking use included Cigarettes. He quit after 25 years of use. He has never used smokeless tobacco. He reports that he does not drink alcohol or use illicit drugs.  Allergies  Allergen Reactions  . Amiodarone Swelling  . Colchicine Swelling    diarrhea  . Crestor (Rosuvastatin Calcium) Swelling  . Lipitor (Atorvastatin Calcium) Swelling  . Lisinopril Other (See Comments)    Unknown reaction  . Lyrica (Pregabalin) Swelling  . Mevacor (Lovastatin) Swelling    GI-bleed   . Micardis (Telmisartan) Other (See Comments)    Unknown reaction  . Neurontin (Gabapentin) Swelling  .  Norvasc (Amlodipine Besylate) Swelling  . Potassium Chloride Er Other (See Comments)    Unknown reaction  . Ranolazine Er Other (See Comments)    Unknown reaction  . Zocor (Simvastatin - High Dose) Swelling   Family History  Problem Relation Age of Onset  . Coronary artery disease Father   . Heart disease Father   . Hypertension Father   . Heart attack Father   . Coronary artery disease Mother   . Heart failure Mother   . Dementia Mother   .  Heart disease Mother     Heart disease before age 60  . Hypertension Mother   . Heart attack Mother   . Diabetes      siblings  . Hypertension Daughter   . Anesthesia problems Neg Hx   . Hypotension Neg Hx   . Malignant hyperthermia Neg Hx   . Pseudochol deficiency Neg Hx   . Heart disease Brother   . Hypertension Brother     Prior to Admission medications   Medication Sig Start Date End Date Taking? Authorizing Provider  allopurinol (ZYLOPRIM) 100 MG tablet Take 100 mg by mouth daily.   Yes Historical Provider, MD  ALPRAZolam (XANAX) 0.25 MG tablet Take 0.25 mg by mouth every 4 (four) hours.    Yes Historical Provider, MD  atenolol (TENORMIN) 100 MG tablet Take 100 mg by mouth daily.    Yes Historical Provider, MD  bisacodyl (BISACODYL) 5 MG EC tablet Take 5 mg by mouth daily as needed. For constipation   Yes Historical Provider, MD  HYDROcodone-acetaminophen (LORCET) 10-650 MG per tablet Take 1 tablet by mouth every 4 (four) hours.   Yes Historical Provider, MD  hydroxyurea (HYDREA) 500 MG capsule Take 500 mg by mouth 3 (three) times a week. Monday, Wednesday and Friday   Yes Historical Provider, MD  insulin NPH-insulin regular (NOVOLIN 70/30) (70-30) 100 UNIT/ML injection Inject 20-58 Units into the skin 2 (two) times daily. Sliding scale   Yes Historical Provider, MD  isosorbide mononitrate (IMDUR) 30 MG 24 hr tablet Take 30 mg by mouth every evening.    Yes Historical Provider, MD  methocarbamol (ROBAXIN) 500 MG tablet Take 500 mg by  mouth 4 (four) times daily.   Yes Historical Provider, MD  Petrolatum (SECURA PROTECTIVE) OINT Apply 1 application topically at bedtime. For bed sores   Yes Historical Provider, MD  Tamsulosin HCl (FLOMAX) 0.4 MG CAPS Take 0.4 mg by mouth daily.     Yes Historical Provider, MD  torsemide (DEMADEX) 20 MG tablet Take 20 mg by mouth daily.    Yes Historical Provider, MD  triamterene-hydrochlorothiazide (MAXZIDE) 75-50 MG per tablet Take 1 tablet by mouth daily.     Yes Historical Provider, MD  BAYER CONTOUR TEST test strip as needed. 10/15/11   Historical Provider, MD  Lancets 28G MISC as needed. 10/15/11   Historical Provider, MD   Physical Exam: Blood pressure 182/64, pulse 75, temperature 98.2 F (36.8 C), temperature source Oral, resp. rate 16, SpO2 99.00%. Filed Vitals:   04/09/12 2145 04/09/12 2200 04/09/12 2215 04/09/12 2235  BP: 170/55 156/53  182/64  Pulse: 65 64 67 75  Temp:  98.2 F (36.8 C)  98.2 F (36.8 C)  TempSrc:    Oral  Resp: 16 13 18 16   SpO2: 100% 100% 100% 99%    General:  NAD, resting comfortably in bed Eyes: PEERLA EOMI ENT: mucous membranes moist Neck: supple w/o JVD Cardiovascular: irr, irr, w/o MRG Respiratory: CTA B Abdomen: soft, nt, nd, bs+ Skin: no rash nor lesion Musculoskeletal: MAE, LLE s/p BKA, L index and R long fingers under surgical dressing, the surgical dressing on one hand is demonstrating a small amount (2-3 cm circumferentially) of sanguinous seep through the bandage, this has been outlined in marker already. Psychiatric: normal tone and affect Neurologic: AAOx3, grossly non-focal  Labs on Admission:  Basic Metabolic Panel:  Lab 04/09/12 4098  NA 137  K 3.8  CL 100  CO2 24  GLUCOSE 84  BUN 27*  CREATININE  1.30  CALCIUM 9.1  MG --  PHOS --   Liver Function Tests: No results found for this basename: AST:5,ALT:5,ALKPHOS:5,BILITOT:5,PROT:5,ALBUMIN:5 in the last 168 hours No results found for this basename: LIPASE:5,AMYLASE:5 in the  last 168 hours No results found for this basename: AMMONIA:5 in the last 168 hours CBC:  Lab 04/09/12 1539  WBC 14.2*  NEUTROABS --  HGB 14.4  HCT 49.9  MCV 88.3  PLT 123*   Cardiac Enzymes: No results found for this basename: CKTOTAL:5,CKMB:5,CKMBINDEX:5,TROPONINI:5 in the last 168 hours BNP: No components found with this basename: POCBNP:5 CBG:  Lab 04/09/12 2122 04/09/12 1632  GLUCAP 85 104*    Radiological Exams on Admission: Dg Chest 2 View  04/09/2012  *RADIOLOGY REPORT*  Clinical Data: Preoperative evaluation.  Ex-smoker.  CHEST - 2 VIEW  Comparison: 11/06/2010.  Findings: There is moderate cardiac silhouette enlargement unchanged. The patient has undergone previous median sternotomy and coronary artery bypass grafting.  No pulmonary infiltrates or masses are seen. There is flattening of the diaphragm on lateral image with generalized hyperinflation configuration consistent with COPD.  Mediastinal and hilar contours appear stable.  Right hemithorax is slightly more lucent than left hemithorax.  This probably reflects mild rotation.  There has been previous resection of the distal end of the right clavicle.  IMPRESSION: Moderate enlargement of the cardiac silhouette.  Generalized hyperinflation consistent with COPD.  No pulmonary edema, pneumonia, or other acute superimposed process is evident.  Stable chronic findings are described above.   Original Report Authenticated By: Onalee Hua Call     EKG: Independently reviewed.  Time spent: 70 min  Georgeanne Frankland M. [facility] Pager [phone]  If 7PM-7AM, please contact night-coverage [email or web address] Password [facility] 04/09/2012, 11:51 PM

## 2012-04-09 NOTE — H&P (Signed)
Donald Dudley is an 71 y.o. male.   Chief Complaint: right long finger and left index finger infection HPI: pt with pvd and diabetes presented to office with worsening finger infections Pt with several day h/o of wounds to dorsum of right long and left index fingers Pt was concerned about wounds not getting better Pt not feeling good and concerned about worsening pain  Past Medical History  Diagnosis Date  . S/P BKA (below knee amputation)   . History of cholecystectomy   . Hx of CABG   . PVD (peripheral vascular disease)   . Peripheral neuropathy   . Dyslipidemia   . Arthritis   . Hyperlipidemia   . Hypercholesterolemia   . Chest pain   . CAD (coronary artery disease)     CABG - 1993  /  PTCA - 2004  /  Cath - 2006  . Gout     takes Allopurinol daily  . Chronic kidney disease, stage III (moderate)   . CLL (chronic lymphocytic leukemia)   . CHF (congestive heart failure)   . Bilateral renal artery stenosis     status post stents  . Polycythemia   . Hypertension     takes Atenolol,Maxzide,and Isosorbide daily  . Atrial fibrillation     not on Coumadin  . MI (myocardial infarction) 1991  . Bruises easily   . Constipation     related to pain meds-takes OTC stool softener daily  . Peripheral edema     takes Torsemide prn  . Urinary frequency   . Enlarged prostate     takes flomax daily  . Diabetes mellitus     takes Novolin 70/30;average fasting 100-120  . Anxiety     takes Xanax daily  . Insomnia     Ambien prn;but none if over a yr  . Anginal pain     Take isosorbide  . Shingles rash     Past Surgical History  Procedure Date  . Coronary angioplasty with stent placement 2004    second diagonal artery -- [name], M.D.   . Cardiac catheterization 2006    Est. EF of 65% -- Diffuse coronary artery disease.  The second diagonal artery that was angioplastied in 2004 is now occluded.  It is no longer a candidate for PTCA since it is now flush occluded.   Normal LV systolic function.  [name], M.D.  . Renal artery stent 2003    Bilateral renal artery stenosis  . Total knee arthroplasty     left  . Distal clavicle excision 2003    SURGEON:  [name], M.D.  . Tendon repair     Left Achilles repair x2  . Coronary artery bypass graft 1993    left internal mammary artery to his LAD artery --   . Coronary artery bypass graft 1994    vein graft failure of in his right coronary artery  . Colonoscopy   . Esophagogastroduodenoscopy   . I&d extremity 09/10/2011    Procedure: IRRIGATION AND DEBRIDEMENT EXTREMITY;  Surgeon: Sherren Kerns, MD;  Location: [facility] OR;  Service: Vascular;  Laterality: Left;  OF BKA  . Pr vein bypass graft,aorto-fem-pop   . Joint replacement 1998    Left knee  . Below knee leg amputation     left  . Cholecystectomy     Family History  Problem Relation Age of Onset  . Coronary artery disease Father   . Heart disease Father   .  Hypertension Father   . Heart attack Father   . Coronary artery disease Mother   . Heart failure Mother   . Dementia Mother   . Heart disease Mother     Heart disease before age 106  . Hypertension Mother   . Heart attack Mother   . Diabetes      siblings  . Hypertension Daughter   . Anesthesia problems Neg Hx   . Hypotension Neg Hx   . Malignant hyperthermia Neg Hx   . Pseudochol deficiency Neg Hx   . Heart disease Brother   . Hypertension Brother    Social History:  reports that he quit smoking about 36 years ago. His smoking use included Cigarettes. He quit after 25 years of use. He has never used smokeless tobacco. He reports that he does not drink alcohol or use illicit drugs.  Allergies:  Allergies  Allergen Reactions  . Amiodarone Swelling  . Colchicine Swelling    diarrhea  . Crestor (Rosuvastatin Calcium) Swelling  . Lipitor (Atorvastatin Calcium) Swelling  . Lisinopril Other (See Comments)    Unknown reaction  . Lyrica (Pregabalin) Swelling  .  Mevacor (Lovastatin) Swelling    GI-bleed   . Micardis (Telmisartan) Other (See Comments)    Unknown reaction  . Neurontin (Gabapentin) Swelling  . Norvasc (Amlodipine Besylate) Swelling  . Potassium Chloride Er Other (See Comments)    Unknown reaction  . Ranolazine Er Other (See Comments)    Unknown reaction  . Zocor (Simvastatin - High Dose) Swelling    Medications Prior to Admission  Medication Sig Dispense Refill  . allopurinol (ZYLOPRIM) 100 MG tablet Take 100 mg by mouth daily.      Marland Kitchen ALPRAZolam (XANAX) 0.25 MG tablet Take 0.25 mg by mouth every 4 (four) hours.       Marland Kitchen atenolol (TENORMIN) 100 MG tablet Take 100 mg by mouth daily.       . bisacodyl (BISACODYL) 5 MG EC tablet Take 5 mg by mouth daily as needed. For constipation      . HYDROcodone-acetaminophen (LORCET) 10-650 MG per tablet Take 1 tablet by mouth every 4 (four) hours.      . hydroxyurea (HYDREA) 500 MG capsule Take 500 mg by mouth 3 (three) times a week. Monday, Wednesday and Friday      . insulin NPH-insulin regular (NOVOLIN 70/30) (70-30) 100 UNIT/ML injection Inject 20-58 Units into the skin 2 (two) times daily. Sliding scale      . isosorbide mononitrate (IMDUR) 30 MG 24 hr tablet Take 30 mg by mouth every evening.       . methocarbamol (ROBAXIN) 500 MG tablet Take 500 mg by mouth 4 (four) times daily.      Marland Kitchen Petrolatum (SECURA PROTECTIVE) OINT Apply 1 application topically at bedtime. For bed sores      . Tamsulosin HCl (FLOMAX) 0.4 MG CAPS Take 0.4 mg by mouth daily.        Marland Kitchen torsemide (DEMADEX) 20 MG tablet Take 20 mg by mouth daily.       Marland Kitchen triamterene-hydrochlorothiazide (MAXZIDE) 75-50 MG per tablet Take 1 tablet by mouth daily.        Marland Kitchen BAYER CONTOUR TEST test strip as needed.      . Lancets 28G MISC as needed.        Results for orders placed during the hospital encounter of 04/09/12 (from the past 48 hour(s))  SURGICAL PCR SCREEN     Status: Abnormal   Collection Time  04/09/12  3:14 PM       Component Value Range Comment   MRSA, PCR POSITIVE (*) NEGATIVE    Staphylococcus aureus POSITIVE (*) NEGATIVE   CBC     Status: Abnormal   Collection Time   04/09/12  3:39 PM      Component Value Range Comment   WBC 14.2 (*) 4.0 - 10.5 K/uL    RBC 5.65  4.22 - 5.81 MIL/uL    Hemoglobin 14.4  13.0 - 17.0 g/dL    HCT 10.2  72.5 - 36.6 %    MCV 88.3  78.0 - 100.0 fL    MCH 25.5 (*) 26.0 - 34.0 pg    MCHC 28.9 (*) 30.0 - 36.0 g/dL    RDW 44.0 (*) 34.7 - 15.5 %    Platelets 123 (*) 150 - 400 K/uL   BASIC METABOLIC PANEL     Status: Abnormal   Collection Time   04/09/12  3:39 PM      Component Value Range Comment   Sodium 137  135 - 145 mEq/L    Potassium 3.8  3.5 - 5.1 mEq/L    Chloride 100  96 - 112 mEq/L    CO2 24  19 - 32 mEq/L    Glucose, Bld 84  70 - 99 mg/dL    BUN 27 (*) 6 - 23 mg/dL    Creatinine, Ser 4.25  0.50 - 1.35 mg/dL    Calcium 9.1  8.4 - 95.6 mg/dL    GFR calc non Af Amer 53 (*) >90 mL/min    GFR calc Af Amer 61 (*) >90 mL/min   GLUCOSE, CAPILLARY     Status: Abnormal   Collection Time   04/09/12  4:32 PM      Component Value Range Comment   Glucose-Capillary 104 (*) 70 - 99 mg/dL    Dg Chest 2 View  38/75/6433  *RADIOLOGY REPORT*  Clinical Data: Preoperative evaluation.  Ex-smoker.  CHEST - 2 VIEW  Comparison: 11/06/2010.  Findings: There is moderate cardiac silhouette enlargement unchanged. The patient has undergone previous median sternotomy and coronary artery bypass grafting.  No pulmonary infiltrates or masses are seen. There is flattening of the diaphragm on lateral image with generalized hyperinflation configuration consistent with COPD.  Mediastinal and hilar contours appear stable.  Right hemithorax is slightly more lucent than left hemithorax.  This probably reflects mild rotation.  There has been previous resection of the distal end of the right clavicle.  IMPRESSION: Moderate enlargement of the cardiac silhouette.  Generalized hyperinflation consistent  with COPD.  No pulmonary edema, pneumonia, or other acute superimposed process is evident.  Stable chronic findings are described above.   Original Report Authenticated By: Onalee Hua Call     ROS: CHART NOTES REVIEWED FROM PREVIOUS HOSPITALIZATIONS FROM LEFT LEG SURGERIES  Blood pressure 159/75, pulse 54, temperature 98 F (36.7 C), temperature source Oral, resp. rate 18, SpO2 95.00%. General Appearance:  Alert, cooperative, no distress, appears MALNOURISHED  Head:  Normocephalic, without obvious abnormality, atraumatic  Eyes:  Pupils equal, conjunctiva/corneas clear,         Throat: Lips, mucosa, and tongue normal; teeth and gums normal  Neck: No visible masses     Lungs:   respirations unlabored  Chest Wall:  No tenderness or deformity  Heart:  Regular rate and rhythm,  Abdomen:   Soft, non-tender,         Extremities: LEFT INDEX FINGER WITH OPEN DRAINING WOUND DORSALLY, SURROUNDING INFECTION AND  ERYTHEMA. LIMITED DIGITAL MOBILITY RIGHT LONG FINGER SEVERAL CM OPEN WOUND DORSALLY WITH DRAINING WOUND AND SURROUNDING ERYTHEMA  Pulses: 2+ and symmetric  Skin: Skin color, texture, turgor normal, no rashes or lesions     Neurologic: Normal    Assessment/Plan LEFT INDEX AND RIGHT LONG FINGER INFECTIONS DIABETES PERIPHERAL VASCULAR DISEASE SHINGLES  TO OR TONIGHT FOR OPEN DEBRIDEMENT AND IRRIGATION WILL CONSULT HOSPITALIST FOR MANAGEMENT OF HIS MEDICAL PROBLEMS  WILL ADMIT AS INPATIENT WILL LIKELY REQUIRE HOSPITALIZATION FOR MULTIPLE DEBRIDEMENTS AND WOUND CARE   Bradly Bienenstock W 04/09/2012, 6:08 PM

## 2012-04-09 NOTE — Anesthesia Postprocedure Evaluation (Signed)
Anesthesia Post Note  Patient: Donald Dudley  Procedure(s) Performed: Procedure(s) (LRB): IRRIGATION AND DEBRIDEMENT EXTREMITY (Bilateral)  Anesthesia type: general  Patient location: PACU  Post pain: Pain level controlled  Post assessment: Patient's Cardiovascular Status Stable  Last Vitals:  Filed Vitals:   04/09/12 2215  BP:   Pulse: 67  Temp:   Resp: 18    Post vital signs: Reviewed and stable  Level of consciousness: sedated  Complications: No apparent anesthesia complications

## 2012-04-09 NOTE — Anesthesia Procedure Notes (Signed)
Procedure Name: LMA Insertion Date/Time: 04/09/2012 8:07 PM Performed by: Garen Lah Pre-anesthesia Checklist: Patient identified, Timeout performed, Emergency Drugs available, Suction available and Patient being monitored Patient Re-evaluated:Patient Re-evaluated prior to inductionOxygen Delivery Method: Circle system utilized Preoxygenation: Pre-oxygenation with 100% oxygen Intubation Type: IV induction LMA: LMA inserted LMA Size: 4.0 Number of attempts: 1 Placement Confirmation: positive ETCO2 and breath sounds checked- equal and bilateral Tube secured with: Tape Dental Injury: Teeth and Oropharynx as per pre-operative assessment

## 2012-04-09 NOTE — Anesthesia Preprocedure Evaluation (Addendum)
Anesthesia Evaluation  Patient identified by MRN, date of birth, ID band Patient awake    Reviewed: Allergy & Precautions, H&P , NPO status , Patient's Chart, lab work & pertinent test results  History of Anesthesia Complications Negative for: history of anesthetic complications  Airway Mallampati: I TM Distance: >3 FB Neck ROM: Full    Dental  (+) Edentulous Upper, Partial Lower and Dental Advisory Given   Pulmonary former smoker (quit 1978),  breath sounds clear to auscultation  Pulmonary exam normal       Cardiovascular hypertension, Pt. on medications and Pt. on home beta blockers + angina + CAD (stress test '12: no ischemia or scar, EF 68%), + Past MI (1991), + Cardiac Stents (2004), + CABG (1993), + Peripheral Vascular Disease and +CHF + dysrhythmias Atrial Fibrillation Rhythm:Irregular Rate:Normal  Est. EF of 65% -- Diffuse coronary artery disease.  The second diagonal artery that was angioplastied in 2004 is now occluded.  It is no longer a candidate for PTCA since it is now flush occluded.  Normal LV systolic function.  [name], M.D.   Neuro/Psych Anxiety  Neuromuscular disease (peripheral neuropathy)    GI/Hepatic Neg liver ROS, GERD-  Medicated and Controlled,  Endo/Other  diabetes, Well Controlled, Insulin DependentGout CLL  Renal/GU Renal InsufficiencyRenal disease     Musculoskeletal  (+) Arthritis -, Osteoarthritis,    Abdominal   Peds  Hematology Polycythemia  CLL   Anesthesia Other Findings   Reproductive/Obstetrics                          Anesthesia Physical Anesthesia Plan  ASA: III  Anesthesia Plan: General   Post-op Pain Management:    Induction: Intravenous  Airway Management Planned: LMA  Additional Equipment:   Intra-op Plan:   Post-operative Plan: Extubation in OR  Informed Consent: I have reviewed the patients History and Physical, chart, labs and  discussed the procedure including the risks, benefits and alternatives for the proposed anesthesia with the patient or authorized representative who has indicated his/her understanding and acceptance.     Plan Discussed with: CRNA and Surgeon  Anesthesia Plan Comments:         Anesthesia Quick Evaluation

## 2012-04-10 DIAGNOSIS — E119 Type 2 diabetes mellitus without complications: Secondary | ICD-10-CM

## 2012-04-10 LAB — GLUCOSE, CAPILLARY
Glucose-Capillary: 69 mg/dL — ABNORMAL LOW (ref 70–99)
Glucose-Capillary: 84 mg/dL (ref 70–99)
Glucose-Capillary: 98 mg/dL (ref 70–99)
Glucose-Capillary: 107 mg/dL — ABNORMAL HIGH (ref 70–99)
Glucose-Capillary: 107 mg/dL — ABNORMAL HIGH (ref 70–99)

## 2012-04-10 LAB — HEMOGLOBIN A1C: Hgb A1c MFr Bld: 5.6 % (ref <5.7)

## 2012-04-10 MED ORDER — VANCOMYCIN HCL 500 MG IV SOLR
500.0000 mg | Freq: Two times a day (BID) | INTRAVENOUS | Status: DC
  Administered 2012-04-10 – 2012-04-13 (×7): 500 mg via INTRAVENOUS
  Filled 2012-04-10 (×8): qty 500

## 2012-04-10 MED ORDER — GLUCERNA SHAKE PO LIQD
237.0000 mL | Freq: Every day | ORAL | Status: DC
  Administered 2012-04-10 – 2012-04-11 (×2): 237 mL via ORAL

## 2012-04-10 MED ORDER — HYDRALAZINE HCL 20 MG/ML IJ SOLN
10.0000 mg | Freq: Four times a day (QID) | INTRAMUSCULAR | Status: DC | PRN
  Filled 2012-04-10: qty 0.5

## 2012-04-10 NOTE — Progress Notes (Signed)
Subjective: 1 Day Post-Op Procedure(s) (LRB): IRRIGATION AND DEBRIDEMENT EXTREMITY (Bilateral) Patient reports pain as moderate Concerned about going home after surgery   Objective: Vital signs in last 24 hours: Temp:  [97.8 F (36.6 C)-98.2 F (36.8 C)] 97.8 F (36.6 C) (12/28 0509) Pulse Rate:  [54-75] 72  (12/28 0509) Resp:  [13-18] 18  (12/28 0509) BP: (141-182)/(42-75) 141/62 mmHg (12/28 0509) SpO2:  [95 %-100 %] 97 % (12/28 0509) FiO2 (%):  [28 %] 28 % (12/27 2353) Weight:  [68 kg (149 lb 14.6 oz)] 68 kg (149 lb 14.6 oz) (12/27 2300)  Intake/Output from previous day: 12/27 0701 - 12/28 0700 In: 1155 [I.V.:1100; IV Piggyback:55] Out: 450 [Urine:450] Intake/Output this shift:     Basename 04/09/12 1539  HGB 14.4    Basename 04/09/12 1539  WBC 14.2*  RBC 5.65  HCT 49.9  PLT 123*    Basename 04/09/12 1539  NA 137  K 3.8  CL 100  CO2 24  BUN 27*  CREATININE 1.30  GLUCOSE 84  CALCIUM 9.1   No results found for this basename: LABPT:2,INR:2 in the last 72 hours  Awake,alert nontoxic Fingers in bandages, clean and dry  Neurologically intact Assessment/Plan: 1 Day Post-Op Procedure(s) (LRB): IRRIGATION AND DEBRIDEMENT EXTREMITY (Bilateral) Continue iv abx Continue inpatient care Will ask cm to see for possible placement post op Will have ot see for hydrotherapy and wound care beginning tomorrow Sharma Covert 04/10/2012, 12:30 PM

## 2012-04-10 NOTE — Evaluation (Signed)
Physical Therapy Evaluation Patient Details Name: Donald Dudley MRN: [id number] DOB: 1940-09-22 Today's Date: 04/10/2012 Time: 4098-1191 PT Time Calculation (min): 28 min  PT Assessment / Plan / Recommendation Clinical Impression  Pt s/p I&D of bilateral finger infections. 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 After the finger surgery, he is limited in mobility secondary to pain with use of UEs. Pt will benefit from additional rehab prior to d/c home as he is requiring a good amount of assist.     PT Assessment  Patient needs continued PT services    Follow Up Recommendations  CIR;Supervision/Assistance - 24 hour    Does the patient have the potential to tolerate intense rehabilitation      Barriers to Discharge        Equipment Recommendations  None recommended by PT    Recommendations for Other Services Rehab consult;OT consult   Frequency Min 3X/week    Precautions / Restrictions Precautions Precautions: Fall Restrictions Weight Bearing Restrictions: No   Pertinent Vitals/Pain Pain 10/10 in R hip. RN aware.       Mobility  Bed Mobility Bed Mobility: Supine to Sit;Sit to Supine;Sitting - Scoot to Edge of Bed Supine to Sit: 4: Min assist Sitting - Scoot to [place] of Bed: 4: Min assist Sit to Supine: 4: Min assist Details for Bed Mobility Assistance: Min assist for support of RLE as pt with increased pain. Cues for sequencing Transfers Transfers: Sit to Stand;Stand to Sit;Stand Pivot Transfers Sit to Stand: 3: Mod assist;With upper extremity assist;From bed Stand to Sit: 3: Mod assist;With upper extremity assist;To bed Stand Pivot Transfers: 3: Mod assist;With armrests Details for Transfer Assistance: Mod assist for support throughout transfers. Pt able to transfer with minimal steps from bed to chair, required increased assistance back to bed. Support through trunk Ambulation/Gait Ambulation/Gait Assistance: Not tested (comment)      Shoulder Instructions     Exercises     PT Diagnosis: Difficulty walking;Acute pain;Generalized weakness  PT Problem List: Decreased strength;Decreased activity tolerance;Decreased mobility;Decreased knowledge of use of DME;Decreased safety awareness;Decreased knowledge of precautions;Pain PT Treatment Interventions: DME instruction;Gait training;Functional mobility training;Therapeutic activities;Therapeutic exercise;Patient/family education   PT Goals Acute Rehab PT Goals PT Goal Formulation: With patient Time For Goal Achievement: 04/24/12 Potential to Achieve Goals: Good Pt will go Supine/Side to Sit: with supervision PT Goal: Supine/Side to Sit - Progress: Goal set today Pt will go Sit to Supine/Side: with supervision PT Goal: Sit to Supine/Side - Progress: Goal set today Pt will go Sit to Stand: with supervision PT Goal: Sit to Stand - Progress: Goal set today Pt will go Stand to Sit: with supervision PT Goal: Stand to Sit - Progress: Goal set today Pt will Transfer Bed to Chair/Chair to Bed: with supervision PT Transfer Goal: Bed to Chair/Chair to Bed - Progress: Goal set today Pt will Ambulate: 1 - 15 feet;with min assist;with least restrictive assistive device PT Goal: Ambulate - Progress: Goal set today  Visit Information  Last PT Received On: 04/10/12 Assistance Needed: +1    Subjective Data  Patient Stated Goal: to get stronger   Prior Functioning  Home Living Lives With: Spouse Available Help at Discharge: Family;Available 24 hours/day Type of Home: House Home Access: Ramped entrance Home Layout: Two level;Able to live on main level with bedroom/bathroom Bathroom Shower/Tub: Walk-in shower;Door [employer] Toilet: Standard Bathroom Accessibility: Yes How Accessible: Accessible via wheelchair;Accessible via walker Home Adaptive Equipment: Wheelchair - manual;Walker - rolling;Shower  chair without back;Grab bars around toilet;Grab bars in shower;Bedside  commode/3-in-1 Prior Function Level of Independence: Independent with assistive device(s) Able to Take Stairs?: No Driving: No Vocation: On disability Comments: can transfer self independently in/out of wheelchair. Walks with RW ~10 ft with supervision Communication Communication: No difficulties Dominant Hand: Right    Cognition  Overall Cognitive Status: Appears within functional limits for tasks assessed/performed Arousal/Alertness: Awake/alert Orientation Level: Appears intact for tasks assessed Behavior During Session: [facility] for tasks performed    Extremity/Trunk Assessment Right Lower Extremity Assessment RLE ROM/Strength/Tone: Deficits RLE ROM/Strength/Tone Deficits: Grossly 4/5. Pain in hip with MMT RLE Sensation: History of peripheral neuropathy;Deficits RLE Sensation Deficits: Decreased sensation below R knee Left Lower Extremity Assessment LLE ROM/Strength/Tone: Deficits LLE ROM/Strength/Tone Deficits: Pt with BKA. Hip Flexion WFL LLE Sensation: Deficits;History of peripheral neuropathy LLE Sensation Deficits: Decreased sensation in stump below knee   Balance    End of Session PT - End of Session Equipment Utilized During Treatment: Gait belt;Other (comment) (L prosthesis) Activity Tolerance: Patient limited by pain Patient left: in bed;with call bell/phone within reach;with bed alarm set Nurse Communication: Mobility status  GP     Milana Kidney 04/10/2012, 12:59 PM  04/10/2012 Milana Kidney DPT PAGER: [phone] OFFICE: [phone]

## 2012-04-10 NOTE — Progress Notes (Signed)
Hypoglycemic Event  CBG: 69  Treatment: 15 gram carbs   Symptoms: hungry and weak  Follow-up CBG: Time:0535 CBG Result:84  Possible Reasons for Event: NPO  Comments/MD notifiedNO    Caprice Kluver  Remember to initiate Hypoglycemia Order Set & complete

## 2012-04-10 NOTE — Progress Notes (Signed)
INITIAL NUTRITION ASSESSMENT  DOCUMENTATION CODES Per approved criteria  -Not Applicable   INTERVENTION:  Glucerna Shake daily (220 kcals, 9.9 gm protein per 8 fl oz can) RD to follow for nutrition care plan  NUTRITION DIAGNOSIS: Increase nutrient needs related to wound healing as evidenced by estimated nutrition needs  Goal: Oral intake with meals & supplements to meet >/= 90% of estimated nutrition needs  Monitor:  PO & supplemental intake, weight, labs, I/O's  Reason for Assessment: Malnutrition Screening Tool Report  71 y.o. male  Admitting Dx: Finger infection  ASSESSMENT: Patient s/p several procedures 12/27: 1. Debridement of skin, subcutaneous tissues, right index finger, excisional debridement 2. Right long finger proximal interphalangeal joint arthrotomy and drainage 3. Left index finger debridement of skin, subcutaneous tissue, excisional debridement   Patient reports a poor appetite; PO intake 50% per flowsheet records; per weight records, has lost approximately 8 lbs in the past 6 months -- not significant for time frame; amenable to trying Glucerna Shake during hospitalization -- RD to order.  Height: Ht Readings from Last 1 Encounters:  04/09/12 5' 8.11" (1.73 m)    Weight: Wt Readings from Last 1 Encounters:  04/09/12 149 lb 14.6 oz (68 kg)    Ideal Body Weight: 70 kg  % Ideal Body Weight: 97%  Wt Readings from Last 10 Encounters:  04/09/12 149 lb 14.6 oz (68 kg)  04/09/12 149 lb 14.6 oz (68 kg)  12/04/11 150 lb (68.04 kg)  10/23/11 151 lb (68.493 kg)  10/02/11 157 lb (71.215 kg)  09/14/11 157 lb (71.215 kg)  09/14/11 157 lb (71.215 kg)  09/04/11 163 lb (73.936 kg)  09/02/11 165 lb (74.844 kg)  04/21/11 163 lb 1.9 oz (73.991 kg)    Usual Body Weight: 157 lb  % Usual Body Weight: 95%  BMI:  Body mass index is 22.72 kg/(m^2).  Estimated Nutritional Needs: Kcal: 1900-2000 Protein: 100-110 gm Fluid: 1.9-2.0 L  Skin: surgical hand  incisions   Diet Order: Carb Control  EDUCATION NEEDS: -No education needs identified at this time   Intake/Output Summary (Last 24 hours) at 04/10/12 1551 Last data filed at 04/10/12 1500  Gross per 24 hour  Intake   2255 ml  Output   1000 ml  Net   1255 ml    Labs:   Lab 04/09/12 1539  NA 137  K 3.8  CL 100  CO2 24  BUN 27*  CREATININE 1.30  CALCIUM 9.1  MG --  PHOS --  GLUCOSE 84    CBG (last 3)   Basename 04/10/12 1214 04/10/12 0759 04/10/12 0536  GLUCAP 107* 98 84    Scheduled Meds:   . allopurinol  100 mg Oral Daily  . ALPRAZolam  0.25 mg Oral Q4H  . atenolol  100 mg Oral Daily  . docusate sodium  100 mg Oral BID  . hydroxyurea  500 mg Oral 3 times weekly  . insulin aspart  0-15 Units Subcutaneous TID WC  . isosorbide mononitrate  30 mg Oral QPM  . mupirocin ointment   Nasal BID  . Tamsulosin HCl  0.4 mg Oral Daily  . torsemide  20 mg Oral Daily  . vancomycin  500 mg Intravenous Q12H  . vitamin C  1,000 mg Oral Daily    Continuous Infusions:   . lactated ringers      Past Medical History  Diagnosis Date  . S/P BKA (below knee amputation)   . History of cholecystectomy   . Hx of  CABG   . PVD (peripheral vascular disease)   . Peripheral neuropathy   . Dyslipidemia   . Arthritis   . Hyperlipidemia   . Hypercholesterolemia   . Chest pain   . CAD (coronary artery disease)     CABG - 1993  /  PTCA - 2004  /  Cath - 2006  . Gout     takes Allopurinol daily  . Chronic kidney disease, stage III (moderate)   . CLL (chronic lymphocytic leukemia)   . CHF (congestive heart failure)   . Bilateral renal artery stenosis     status post stents  . Polycythemia   . Hypertension     takes Atenolol,Maxzide,and Isosorbide daily  . Atrial fibrillation     not on Coumadin  . MI (myocardial infarction) 1991  . Bruises easily   . Constipation     related to pain meds-takes OTC stool softener daily  . Peripheral edema     takes Torsemide prn  .  Urinary frequency   . Enlarged prostate     takes flomax daily  . Diabetes mellitus     takes Novolin 70/30;average fasting 100-120  . Anxiety     takes Xanax daily  . Insomnia     Ambien prn;but none if over a yr  . Anginal pain     Take isosorbide  . Shingles rash     Past Surgical History  Procedure Date  . Coronary angioplasty with stent placement 2004    second diagonal artery -- [name], M.D.   . Cardiac catheterization 2006    Est. EF of 65% -- Diffuse coronary artery disease.  The second diagonal artery that was angioplastied in 2004 is now occluded.  It is no longer a candidate for PTCA since it is now flush occluded.  Normal LV systolic function.  [name], M.D.  . Renal artery stent 2003    Bilateral renal artery stenosis  . Total knee arthroplasty     left  . Distal clavicle excision 2003    SURGEON:  [name], M.D.  . Tendon repair     Left Achilles repair x2  . Coronary artery bypass graft 1993    left internal mammary artery to his LAD artery --   . Coronary artery bypass graft 1994    vein graft failure of in his right coronary artery  . Colonoscopy   . Esophagogastroduodenoscopy   . I&d extremity 09/10/2011    Procedure: IRRIGATION AND DEBRIDEMENT EXTREMITY;  Surgeon: Sherren Kerns, MD;  Location: [facility] OR;  Service: Vascular;  Laterality: Left;  OF BKA  . Pr vein bypass graft,aorto-fem-pop   . Joint replacement 1998    Left knee  . Below knee leg amputation     left  . Cholecystectomy     Kirkland Hun, RD, LDN Pager #: [phone] After-Hours Pager #: [phone]

## 2012-04-10 NOTE — Progress Notes (Signed)
[facility] PROGRESS NOTE  [name]:[id number] DOB: May 13, 1940 DOA: 04/09/2012 PCP: Lorenda Peck, MD  Assessment/Plan: Principal Problem:  *Finger infection Active Problems:  A-fib  CAD (coronary artery disease)  HTN (hypertension)  DM2 (diabetes mellitus, type 2)  CKD (chronic kidney disease) stage 3, GFR 30-59 ml/min    1. Finger infection - POD #0 of I+D of L index and R middle fingers by orthopaedic surgery, patient is on vancomycin started pre-operatively today, pharmacy is dosing this. 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 Given his leukocytosis, the 2 different geographic but simultaneous sites of infection, and the known propensity of MRSA to cause seeding in blood stream, will go ahead and order blood cultures as well. Continuation of vancomycin per orthopedic surgery 2. A.Fib - continue home rate control meds, patient is not on coumadin for this, he is followed by a cardiologist as an outpatient. 3. Leukocytosis - secondary to #1 4. CAD - chronic and stable 5. DM2 - will hold off on his 70/30 "sliding scale" he takes at home and instead put him on a med dose SSI while an inpatient here, carb modified medium diet. 6. CKD stage 3 - chronic and stable, his GFR today actually appears to be significantly better than his usual baseline, recheck BMP in AM. 7. HTN - discontinue triamterene/HCTZ, SBP elevated 150s-180s this evening but the 180 reading was after they brought patient up stairs while he was still in significant pain he states, continue to monitor closely, add short acting PRN labetalol if his BP continues to be high. Our team will followup again tomorrow. Please contact me if I can be of assistance in the meanwhile. Thank you for this consultation.  Chief Complaint: Finger infections  HPI:  [name] is a 71 yo M POD #0 of I+D of his  L index and R middle (long) fingers which had become infected. He states the infections started as "pimples", burst, then continued to not improve and get larger over time. He describes the drainage he was having as thick, yellow drainage. He states the infections were very painful. 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 Cultures of the wounds were taken and are pending, the patient is currently on vancomycin IV.  His initial lab work up is remarkable for a BGL of 84, and a WBC of 14.2. Hospitalist has been asked to consult for medical management.     Procedures: POSTOPERATIVE DIAGNOSES:  1. Left index finger gouty infection, superinfection.  2. Right long finger gouty superinfection.   Antibiotics:  None  HPI/Subjective: Hypertensive this morning  Objective: Filed Vitals:   04/09/12 2235 04/09/12 2300 04/09/12 2353 04/10/12 0509  BP: 182/64   141/62  Pulse: 75   72  Temp: 98.2 F (36.8 C)   97.8 F (36.6 C)  TempSrc: Oral   Oral  Resp: 16   18  Height:  5' 8.11" (1.73 m)    Weight:  68 kg (149 lb 14.6 oz)    SpO2: 99%  99% 97%    Intake/Output Summary (Last 24 hours) at 04/10/12 0926 Last data filed at 04/10/12 0700  Gross per 24 hour  Intake   1155 ml  Output    450 ml  Net    705 ml    Exam:  General: NAD, resting comfortably in bed  Eyes: PEERLA EOMI  ENT: mucous membranes moist  Neck: supple w/o JVD  Cardiovascular: irr, irr, w/o MRG  Respiratory: CTA B  Abdomen: soft, nt, nd, bs+  Skin: no rash nor lesion  Musculoskeletal: MAE, LLE s/p BKA, L index and R long fingers under surgical dressing, the surgical dressing on one hand is demonstrating a small amount (2-3 cm circumferentially) of sanguinous seep through the bandage, this has been outlined in marker already.  Psychiatric: normal tone and affect  Neurologic: AAOx3, grossly  non-focal    Data Reviewed: Basic Metabolic Panel:  Lab 04/09/12 0865  NA 137  K 3.8  CL 100  CO2 24  GLUCOSE 84  BUN 27*  CREATININE 1.30  CALCIUM 9.1  MG --  PHOS --    Liver Function Tests: No results found for this basename: AST:5,ALT:5,ALKPHOS:5,BILITOT:5,PROT:5,ALBUMIN:5 in the last 168 hours No results found for this basename: LIPASE:5,AMYLASE:5 in the last 168 hours No results found for this basename: AMMONIA:5 in the last 168 hours  CBC:  Lab 04/09/12 1539  WBC 14.2*  NEUTROABS --  HGB 14.4  HCT 49.9  MCV 88.3  PLT 123*    Cardiac Enzymes: No results found for this basename: CKTOTAL:5,CKMB:5,CKMBINDEX:5,TROPONINI:5 in the last 168 hours BNP (last 3 results) No results found for this basename: PROBNP:3 in the last 8760 hours   CBG:  Lab 04/10/12 0759 04/10/12 0536 04/10/12 0506 04/09/12 2122 04/09/12 1632  GLUCAP 98 84 69* 85 104*    Recent Results (from the past 240 hour(s))  SURGICAL PCR SCREEN     Status: Abnormal   Collection Time   04/09/12  3:14 PM      Component Value Range Status Comment   MRSA, PCR POSITIVE (*) NEGATIVE Final    Staphylococcus aureus POSITIVE (*) NEGATIVE Final   CULTURE, ROUTINE-ABSCESS     Status: Normal (Preliminary result)   Collection Time   04/09/12  9:44 PM      Component Value Range Status Comment   Specimen Description ABSCESS RIGHT FINGER   Final    Special Requests NONE   Final    Gram Stain     Final    Value: FEW WBC PRESENT, PREDOMINANTLY PMN     RARE SQUAMOUS EPITHELIAL CELLS PRESENT     NO ORGANISMS SEEN   Culture NO GROWTH   Final    Report Status PENDING   Incomplete   ANAEROBIC CULTURE     Status: Normal (Preliminary result)   Collection Time   04/09/12  9:44 PM      Component Value Range Status Comment   Specimen Description ABSCESS RIGHT FINGER   Final    Special Requests NONE   Final    Gram Stain     Final    Value: FEW WBC PRESENT, PREDOMINANTLY PMN     RARE SQUAMOUS EPITHELIAL CELLS  PRESENT     NO ORGANISMS SEEN   Culture PENDING   Incomplete    Report Status PENDING   Incomplete   ANAEROBIC CULTURE     Status: Normal (Preliminary result)   Collection Time   04/09/12  9:48 PM      Component Value Range Status Comment   Specimen Description ABSCESS LEFT FINGER   Final    Special Requests NONE   Final    Gram Stain     Final    Value: RARE WBC  PRESENT, PREDOMINANTLY PMN     FEW SQUAMOUS EPITHELIAL CELLS PRESENT     NO ORGANISMS SEEN   Culture PENDING   Incomplete    Report Status PENDING   Incomplete   CULTURE, ROUTINE-ABSCESS     Status: Normal (Preliminary result)   Collection Time   04/09/12  9:48 PM      Component Value Range Status Comment   Specimen Description ABSCESS LEFT FINGER   Final    Special Requests NONE   Final    Gram Stain     Final    Value: RARE WBC PRESENT, PREDOMINANTLY PMN     FEW SQUAMOUS EPITHELIAL CELLS PRESENT     NO ORGANISMS SEEN   Culture NO GROWTH   Final    Report Status PENDING   Incomplete      Studies: Dg Chest 2 View  04/09/2012  *RADIOLOGY REPORT*  Clinical Data: Preoperative evaluation.  Ex-smoker.  CHEST - 2 VIEW  Comparison: 11/06/2010.  Findings: There is moderate cardiac silhouette enlargement unchanged. The patient has undergone previous median sternotomy and coronary artery bypass grafting.  No pulmonary infiltrates or masses are seen. There is flattening of the diaphragm on lateral image with generalized hyperinflation configuration consistent with COPD.  Mediastinal and hilar contours appear stable.  Right hemithorax is slightly more lucent than left hemithorax.  This probably reflects mild rotation.  There has been previous resection of the distal end of the right clavicle.  IMPRESSION: Moderate enlargement of the cardiac silhouette.  Generalized hyperinflation consistent with COPD.  No pulmonary edema, pneumonia, or other acute superimposed process is evident.  Stable chronic findings are described above.   Original  Report Authenticated By: Onalee Hua Call     Scheduled Meds:   . allopurinol  100 mg Oral Daily  . ALPRAZolam  0.25 mg Oral Q4H  . atenolol  100 mg Oral Daily  . docusate sodium  100 mg Oral BID  . HYDROmorphone      . HYDROmorphone      . hydroxyurea  500 mg Oral 3 times weekly  . insulin aspart  0-15 Units Subcutaneous TID WC  . isosorbide mononitrate  30 mg Oral QPM  . mupirocin ointment   Nasal BID  . Tamsulosin HCl  0.4 mg Oral Daily  . torsemide  20 mg Oral Daily  . triamterene-hydrochlorothiazide  1 tablet Oral Daily  . vancomycin  500 mg Intravenous Q12H  . vitamin C  1,000 mg Oral Daily   Continuous Infusions:   . lactated ringers      Principal Problem:  *Finger infection Active Problems:  A-fib  CAD (coronary artery disease)  HTN (hypertension)  DM2 (diabetes mellitus, type 2)  CKD (chronic kidney disease) stage 3, GFR 30-59 ml/min    Time spent: 40 minutes   [facility]  [facility] Pager [phone]. If 8PM-8AM, please contact night-coverage at [email or web address], password [facility] 04/10/2012, 9:26 AM  LOS: 1 day

## 2012-04-10 NOTE — Progress Notes (Signed)
ANTIBIOTIC CONSULT NOTE - INITIAL  Pharmacy Consult for vancomycin Indication: R long finger and L index finger SSTI infection  Allergies  Allergen Reactions  . Amiodarone Swelling  . Colchicine Swelling    diarrhea  . Crestor (Rosuvastatin Calcium) Swelling  . Lipitor (Atorvastatin Calcium) Swelling  . Lisinopril Other (See Comments)    Unknown reaction  . Lyrica (Pregabalin) Swelling  . Mevacor (Lovastatin) Swelling    GI-bleed   . Micardis (Telmisartan) Other (See Comments)    Unknown reaction  . Neurontin (Gabapentin) Swelling  . Norvasc (Amlodipine Besylate) Swelling  . Potassium Chloride Er Other (See Comments)    Unknown reaction  . Ranolazine Er Other (See Comments)    Unknown reaction  . Zocor (Simvastatin - High Dose) Swelling    Patient Measurements: Height: 5' 8.11" (173 cm) Weight: 149 lb 14.6 oz (68 kg) (Per 12/04/11 documentation) IBW/kg (Calculated) : 68.65   Vital Signs: Temp: 98.2 F (36.8 C) (12/27 2235) Temp src: Oral (12/27 2235) BP: 182/64 mmHg (12/27 2235) Pulse Rate: 75  (12/27 2235) Intake/Output from previous day: 12/27 0701 - 12/28 0700 In: 700 [I.V.:700] Out: -  Intake/Output from this shift: Total I/O In: 700 [I.V.:700] Out: -   Labs:  Basename 04/09/12 1539  WBC 14.2*  HGB 14.4  PLT 123*  LABCREA --  CREATININE 1.30   Estimated Creatinine Clearance: 48.7 ml/min (by C-G formula based on Cr of 1.3). No results found for this basename: VANCOTROUGH:2,VANCOPEAK:2,VANCORANDOM:2,GENTTROUGH:2,GENTPEAK:2,GENTRANDOM:2,TOBRATROUGH:2,TOBRAPEAK:2,TOBRARND:2,AMIKACINPEAK:2,AMIKACINTROU:2,AMIKACIN:2, in the last 72 hours   Microbiology: Recent Results (from the past 720 hour(s))  SURGICAL PCR SCREEN     Status: Abnormal   Collection Time   04/09/12  3:14 PM      Component Value Range Status Comment   MRSA, PCR POSITIVE (*) NEGATIVE Final    Staphylococcus aureus POSITIVE (*) NEGATIVE Final     Medical History: Past Medical History    Diagnosis Date  . S/P BKA (below knee amputation)   . History of cholecystectomy   . Hx of CABG   . PVD (peripheral vascular disease)   . Peripheral neuropathy   . Dyslipidemia   . Arthritis   . Hyperlipidemia   . Hypercholesterolemia   . Chest pain   . CAD (coronary artery disease)     CABG - 1993  /  PTCA - 2004  /  Cath - 2006  . Gout     takes Allopurinol daily  . Chronic kidney disease, stage III (moderate)   . CLL (chronic lymphocytic leukemia)   . CHF (congestive heart failure)   . Bilateral renal artery stenosis     status post stents  . Polycythemia   . Hypertension     takes Atenolol,Maxzide,and Isosorbide daily  . Atrial fibrillation     not on Coumadin  . MI (myocardial infarction) 1991  . Bruises easily   . Constipation     related to pain meds-takes OTC stool softener daily  . Peripheral edema     takes Torsemide prn  . Urinary frequency   . Enlarged prostate     takes flomax daily  . Diabetes mellitus     takes Novolin 70/30;average fasting 100-120  . Anxiety     takes Xanax daily  . Insomnia     Ambien prn;but none if over a yr  . Anginal pain     Take isosorbide  . Shingles rash     Medications:  Scheduled:    . allopurinol  100 mg Oral Daily  .  ALPRAZolam  0.25 mg Oral Q4H  . atenolol  100 mg Oral Daily  . docusate sodium  100 mg Oral BID  . fentaNYL      . [COMPLETED] fentaNYL  50-100 mcg Intravenous Once  . HYDROmorphone      . HYDROmorphone      . hydroxyurea  500 mg Oral 3 times weekly  . insulin aspart  0-15 Units Subcutaneous TID WC  . isosorbide mononitrate  30 mg Oral QPM  . mupirocin ointment   Nasal BID  . Tamsulosin HCl  0.4 mg Oral Daily  . torsemide  20 mg Oral Daily  . triamterene-hydrochlorothiazide  1 tablet Oral Daily  . [COMPLETED] vancomycin  1,000 mg Intravenous 60 min Pre-Op  . vitamin C  1,000 mg Oral Daily  . [DISCONTINUED] allopurinol  100 mg Oral Daily  . [DISCONTINUED] ALPRAZolam  0.25 mg Oral Q4H  .  [DISCONTINUED] chlorhexidine  60 mL Topical Once   Assessment: 71 yo male s/p I&D of  R long finger and L index finger. Pharmacy to manage vancomycin for SSTI. Patient received vancomycin 1gm x 1 at ~21:00.   Goal of Therapy:  Vancomycin trough 10-20 mcg/mL  Plan:  1. Vancomycin 500mg  IV Q12H.   Thad Ranger, Mellody Drown 04/10/2012,12:12 AM

## 2012-04-10 NOTE — Op Note (Signed)
NAME:  Donald Dudley, Donald Dudley NO.:  [id number]  MEDICAL RECORD NO.:  [id number]  LOCATION:  [id number]                        FACILITY:  [facility]  PHYSICIAN:  Madelynn Done, MD  DATE OF BIRTH:  September 24, 1940  DATE OF PROCEDURE:  04/09/2012 DATE OF DISCHARGE:                              OPERATIVE REPORT   PREOPERATIVE DIAGNOSES: 1. Left index finger gouty infection, superinfection. 2. Right long finger gouty superinfection.  POSTOPERATIVE DIAGNOSES: 1. Left index finger gouty infection, superinfection. 2. Right long finger gouty superinfection.  ATTENDING PHYSICIAN:  Madelynn Done, MD who scrubbed and present for the entire procedure.  ASSISTANT SURGEON:  None.  ANESTHESIA:  General via LMA.  SURGICAL PROCEDURES: 1. Debridement of skin, subcutaneous tissues, right index finger,     excisional debridement less than 5 cm. 2. Right long finger proximal interphalangeal joint arthrotomy and     drainage. 3. Left index finger debridement of skin, subcutaneous tissue,     excisional debridement less than 5 cm.  SURGICAL INDICATIONS:  Mr. Clinch is a 71 year old gentleman who presented to the office with badly draining open wounds over the index finger and long finger.  Given his history, it was recommended that he undergo the above procedure.  Risks, benefits, and alternatives were discussed in detail with the patient and signed informed consent was obtained.  Risks include, but not limited to bleeding, infection, damage to nearby nerves, arteries, or tendons, loss of motion of the wrist and digits, incomplete relief of symptoms, and need for further surgical intervention.  DESCRIPTION OF PROCEDURE:  The patient was properly identified in the preop holding area and mark with a permanent marker made on the right long finger and the left index finger to indicate the correct operative sites.  The patient was then brought back to the operating room, placed supine on  anesthesia room table where general anesthesia was administered.  The patient tolerated this well.  Well-padded tourniquet was then placed on both forearms and sealed with 1000 drape.  The right upper extremity was then prepped and draped in normal sterile fashion. Attention was then turned to the right long finger.  The patient appeared as though he had gouty crystal erode through the skin and it appeared to cause the superinfection.  A longitudinal incision was made directly over the dorsal radial aspect of the finger.  Dissection was then carried down through the skin and subcutaneous tissues.  The patient did have moderate amount of purulence.  Wound cultures were then taken.  The proximal interphalangeal joint was then opened.  Arthrotomy was then carried out of the proximal interphalangeal joint and thoroughly irrigated.  The patient did have a crystalline arthropathy noted and a gouty tophaceous deposits were then carefully debrided. Excisional debridement was then carried out of the skin and subcutaneous tissue to debride the area.  This appeared to be a superinfection upon a gouty attack.  The wound was then thoroughly irrigated.  The skin was then loosely reapproximated and closed after debridement with 4-0 Prolene sutures.  Adaptic dressing, sterile compressive bandage were then applied.  The patient tolerated the procedure well.  Attention was then turned to the left index finger.  The left upper extremity was then prepped and draped in normal sterile fashion.  Time- out was called.  Correct site was identified, and procedure then begun. Dorsal radial incision was made directly in a similar fashion in the contralateral side.  Again same thing superinfection of gouty crystals and excisional debridement was then carried out of the tophaceous gouty deposits.  The joint was not opened.  It appeared along the dorsal surface of the index finger.  It did not appear to involve the  joint. The wound was then thoroughly irrigated.  Excisional debridement was then carried out with sharp scissors and curettes.  Copious wound irrigation done throughout.  Following this, the skin was then tacked back with simple Prolene sutures.  Xeroform, Adaptic dressing, sterile compressive bandage were then applied.  The patient tolerated the procedures well and returned to the recovery room in good condition.  INTRAOPERATIVE WOUND CULTURES:  Aerobic and anaerobic cultures were then taken on both wounds.  POSTPROCEDURAL PLAN:  The patient will be admitted for the IV antibiotics and pain control.  Medical management be consulted by the Hospitalist Service.  We will hopefully try to let him go home on Sunday with closed wound care and follow up in the office on Monday Whirlpool and wound care.  Again appear to be superinfection from gout.  The other possibility would be a pyoderma gangrenosum which given his history with the infections that I have operated on the right index finger and the diabetes and peripheral vascular disease was more concerned for a bacterial infection of the skin and given his history, care was taken for debridement of bacterial infection.     Madelynn Done, MD     FWO/MEDQ  D:  04/09/2012  T:  04/10/2012  Job:  [id number]

## 2012-04-11 LAB — CBC
MCH: 25.6 pg — ABNORMAL LOW (ref 26.0–34.0)
MCHC: 29.1 g/dL — ABNORMAL LOW (ref 30.0–36.0)
MCV: 88 fL (ref 78.0–100.0)
Platelets: 104 10*3/uL — ABNORMAL LOW (ref 150–400)
RDW: 20 % — ABNORMAL HIGH (ref 11.5–15.5)

## 2012-04-11 LAB — COMPREHENSIVE METABOLIC PANEL
ALT: 8 U/L (ref 0–53)
AST: 18 U/L (ref 0–37)
CO2: 24 mEq/L (ref 19–32)
Calcium: 8.9 mg/dL (ref 8.4–10.5)
Creatinine, Ser: 1.21 mg/dL (ref 0.50–1.35)
GFR calc non Af Amer: 58 mL/min — ABNORMAL LOW (ref >90)
Sodium: 136 mEq/L (ref 135–145)
Total Protein: 5.4 g/dL — ABNORMAL LOW (ref 6.0–8.3)

## 2012-04-11 LAB — GLUCOSE, CAPILLARY
Glucose-Capillary: 100 mg/dL — ABNORMAL HIGH (ref 70–99)
Glucose-Capillary: 123 mg/dL — ABNORMAL HIGH (ref 70–99)
Glucose-Capillary: 117 mg/dL — ABNORMAL HIGH (ref 70–99)
Glucose-Capillary: 114 mg/dL — ABNORMAL HIGH (ref 70–99)

## 2012-04-11 MED ORDER — POTASSIUM CHLORIDE CRYS ER 20 MEQ PO TBCR
40.0000 meq | EXTENDED_RELEASE_TABLET | Freq: Once | ORAL | Status: AC
  Administered 2012-04-11: 40 meq via ORAL
  Filled 2012-04-11: qty 2

## 2012-04-11 MED ORDER — SULFAMETHOXAZOLE-TRIMETHOPRIM 800-160 MG PO TABS: 1.0000 | ORAL_TABLET | Freq: Two times a day (BID) | ORAL | Status: DC

## 2012-04-11 MED ORDER — OXYCODONE-ACETAMINOPHEN 5-325 MG PO TABS: 1.0000 | ORAL_TABLET | ORAL | Status: DC | PRN

## 2012-04-11 NOTE — Progress Notes (Signed)
[facility] PROGRESS NOTE  Donald Dudley NGE:[id number] DOB: [birth year] DOA: 04/09/2012 PCP: Lorenda Peck, MD  Assessment/Plan: Principal Problem:  *Finger infection Active Problems:  A-fib  CAD (coronary artery disease)  HTN (hypertension)  DM2 (diabetes mellitus, type 2)  CKD (chronic kidney disease) stage 3, GFR 30-59 ml/min    Finger infection - POD #2 of I+D of L index and R middle fingers by orthopaedic surgery, patient is on vancomycin started pre-operatively today, pharmacy is dosing this. Agree with vancomycin emperically . If surgical margins clear may discontinue antibiotics. If not would recommend infectious disease consultation prior to discharge. Given his leukocytosis, the 2 different geographic but simultaneous sites of infection, blood cultures negative thus far,. Continuation of vancomycin per orthopedic surgery.CIR;Supervision/Assistance - 24 hour  1.  2. A.Fib - continue home rate control meds, patient is not on coumadin for this, he is followed by a cardiologist as an outpatient. 3. Leukocytosis - secondary to #1 4. CAD - chronic and stable 5. DM2 - will hold off on his 70/30 "sliding scale" he takes at home and instead put him on a med dose SSI while an inpatient here, carb modified medium diet. 6. CKD stage 3 - chronic and stable, his GFR today actually appears to be significantly better than his usual baseline, recheck BMP in AM. 7. HTN - discontinue triamterene/HCTZ, SBP elevated 150s-180s this evening but the 180 reading was after they brought patient up stairs while he was still in significant pain he states, continue to monitor closely, add short acting PRN labetalol if his BP continues to be high. Our team will followup again tomorrow. Please contact me if I can be of assistance in the meanwhile. Thank you for this consultation.   Chief Complaint: Finger infections  HPI:  Donald Dudley is a 71 yo M POD #0 of I+D of his L index and R middle  (long) fingers which had become infected. He states the infections started as "pimples", burst, then continued to not improve and get larger over time. He describes the drainage he was having as thick, yellow drainage. He states the infections were very painful. 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 Cultures of the wounds were taken and are pending, the patient is currently on vancomycin IV.  His initial lab work up is remarkable for a BGL of 84, and a WBC of 14.2. Hospitalist has been asked to consult for medical management.    Procedures:  POSTOPERATIVE DIAGNOSES:  1. Left index finger gouty infection, superinfection.  2. Right long finger gouty superinfection.  Antibiotics:  None HPI/Subjective:  Blood pressure improved   Objective: Filed Vitals:   04/10/12 1441 04/10/12 2224 04/11/12 0541 04/11/12 1005  BP: 151/72 126/54 146/99 156/70  Pulse: 70 84 83 73  Temp: 98 F (36.7 C) 98.4 F (36.9 C) 98.4 F (36.9 C)   TempSrc: Oral Oral Oral   Resp: 18 18 18    Height:      Weight:      SpO2: 98% 98% 99% 100%    Intake/Output Summary (Last 24 hours) at 04/11/12 1011 Last data filed at 04/11/12 0542  Gross per 24 hour  Intake    860 ml  Output   1400 ml  Net   -540 ml    Exam: neral: NAD, resting comfortably in bed Eyes: PEERLA  EOMI ENT: mucous membranes moist Neck: supple w/o JVD Cardiovascular: irr, irr, w/o MRG Respiratory: CTA B Abdomen: soft, nt, nd, bs+ Skin: no rash nor lesion Musculoskeletal: MAE, LLE s/p BKA, L index and R long fingers under surgical dressing, the surgical dressing on one hand is demonstrating a small amount (2-3 cm circumferentially) of sanguinous seep through the bandage, this has been outlined in marker already. Psychiatric: normal tone and affect Neurologic: AAOx3, grossly non-focal    Data Reviewed: Basic Metabolic  Panel:  Lab 04/11/12 0455 04/09/12 1539  NA 136 137  K 3.3* 3.8  CL 99 100  CO2 24 24  GLUCOSE 89 84  BUN 22 27*  CREATININE 1.21 1.30  CALCIUM 8.9 9.1  MG -- --  PHOS -- --    Liver Function Tests:  Lab 04/11/12 0455  AST 18  ALT 8  ALKPHOS 112  BILITOT 0.6  PROT 5.4*  ALBUMIN 2.7*   No results found for this basename: LIPASE:5,AMYLASE:5 in the last 168 hours No results found for this basename: AMMONIA:5 in the last 168 hours  CBC:  Lab 04/11/12 0455 04/09/12 1539  WBC 12.7* 14.2*  NEUTROABS -- --  HGB 13.8 14.4  HCT 47.5 49.9  MCV 88.0 88.3  PLT 104* 123*    Cardiac Enzymes: No results found for this basename: CKTOTAL:5,CKMB:5,CKMBINDEX:5,TROPONINI:5 in the last 168 hours BNP (last 3 results) No results found for this basename: PROBNP:3 in the last 8760 hours   CBG:  Lab 04/11/12 0746 04/10/12 2228 04/10/12 1730 04/10/12 1214 04/10/12 0759  GLUCAP 100* 107* 114* 107* 98    Recent Results (from the past 240 hour(s))  SURGICAL PCR SCREEN     Status: Abnormal   Collection Time   04/09/12  3:14 PM      Component Value Range Status Comment   MRSA, PCR POSITIVE (*) NEGATIVE Final    Staphylococcus aureus POSITIVE (*) NEGATIVE Final   CULTURE, ROUTINE-ABSCESS     Status: Normal (Preliminary result)   Collection Time   04/09/12  9:44 PM      Component Value Range Status Comment   Specimen Description ABSCESS RIGHT FINGER   Final    Special Requests NONE   Final    Gram Stain     Final    Value: FEW WBC PRESENT, PREDOMINANTLY PMN     RARE SQUAMOUS EPITHELIAL CELLS PRESENT     NO ORGANISMS SEEN   Culture FEW STAPHYLOCOCCUS AUREUS   Final    Report Status PENDING   Incomplete   ANAEROBIC CULTURE     Status: Normal (Preliminary result)   Collection Time   04/09/12  9:44 PM      Component Value Range Status Comment   Specimen Description ABSCESS RIGHT FINGER   Final    Special Requests NONE   Final    Gram Stain     Final    Value: FEW WBC PRESENT,  PREDOMINANTLY PMN     RARE SQUAMOUS EPITHELIAL CELLS PRESENT     NO ORGANISMS SEEN   Culture     Final    Value: NO ANAEROBES ISOLATED; CULTURE IN PROGRESS FOR 5 DAYS   Report Status PENDING   Incomplete   ANAEROBIC CULTURE     Status: Normal (Preliminary result)   Collection Time   04/09/12  9:48 PM      Component Value Range Status Comment   Specimen Description ABSCESS LEFT FINGER   Final    Special Requests NONE  Final    Gram Stain     Final    Value: RARE WBC PRESENT, PREDOMINANTLY PMN     FEW SQUAMOUS EPITHELIAL CELLS PRESENT     NO ORGANISMS SEEN   Culture     Final    Value: NO ANAEROBES ISOLATED; CULTURE IN PROGRESS FOR 5 DAYS   Report Status PENDING   Incomplete   CULTURE, ROUTINE-ABSCESS     Status: Normal (Preliminary result)   Collection Time   04/09/12  9:48 PM      Component Value Range Status Comment   Specimen Description ABSCESS LEFT FINGER   Final    Special Requests NONE   Final    Gram Stain     Final    Value: RARE WBC PRESENT, PREDOMINANTLY PMN     FEW SQUAMOUS EPITHELIAL CELLS PRESENT     NO ORGANISMS SEEN   Culture FEW STAPHYLOCOCCUS AUREUS   Final    Report Status PENDING   Incomplete      Studies: Dg Chest 2 View  04/09/2012  *RADIOLOGY REPORT*  Clinical Data: Preoperative evaluation.  Ex-smoker.  CHEST - 2 VIEW  Comparison: 11/06/2010.  Findings: There is moderate cardiac silhouette enlargement unchanged. The patient has undergone previous median sternotomy and coronary artery bypass grafting.  No pulmonary infiltrates or masses are seen. There is flattening of the diaphragm on lateral image with generalized hyperinflation configuration consistent with COPD.  Mediastinal and hilar contours appear stable.  Right hemithorax is slightly more lucent than left hemithorax.  This probably reflects mild rotation.  There has been previous resection of the distal end of the right clavicle.  IMPRESSION: Moderate enlargement of the cardiac silhouette.   Generalized hyperinflation consistent with COPD.  No pulmonary edema, pneumonia, or other acute superimposed process is evident.  Stable chronic findings are described above.   Original Report Authenticated By: Onalee Hua Call     Scheduled Meds:   . allopurinol  100 mg Oral Daily  . ALPRAZolam  0.25 mg Oral Q4H  . atenolol  100 mg Oral Daily  . docusate sodium  100 mg Oral BID  . feeding supplement  237 mL Oral Daily  . hydroxyurea  500 mg Oral 3 times weekly  . insulin aspart  0-15 Units Subcutaneous TID WC  . isosorbide mononitrate  30 mg Oral QPM  . mupirocin ointment   Nasal BID  . potassium chloride  40 mEq Oral Once  . Tamsulosin HCl  0.4 mg Oral Daily  . torsemide  20 mg Oral Daily  . vancomycin  500 mg Intravenous Q12H  . vitamin C  1,000 mg Oral Daily   Continuous Infusions:   . lactated ringers 50 mL/hr at 04/10/12 1800    Principal Problem:  *Finger infection Active Problems:  A-fib  CAD (coronary artery disease)  HTN (hypertension)  DM2 (diabetes mellitus, type 2)  CKD (chronic kidney disease) stage 3, GFR 30-59 ml/min    Time spent: 40 minutes   [facility]  [facility] Pager [phone]. If 8PM-8AM, please contact night-coverage at [email or web address], password [facility] 04/11/2012, 10:11 AM  LOS: 2 days

## 2012-04-11 NOTE — Clinical Social Work Note (Signed)
CSW consult for SNF. Plan is for home with [facility] services by [facility], per chart review. CSW signing off as no other CSW needs identified at this time.  Dellie Burns, MSW, LCSWA [phone] (Weekends 8:00am-4:30pm)

## 2012-04-11 NOTE — Progress Notes (Signed)
Subjective: Pt doing ok mild pain in fingers  Objective: Vital signs in last 24 hours: Temp:  [98 F (36.7 C)-98.4 F (36.9 C)] 98.4 F (36.9 C) (12/29 0541) Pulse Rate:  [70-84] 73  (12/29 1005) Resp:  [18] 18  (12/29 0541) BP: (126-156)/(54-99) 156/70 mmHg (12/29 1005) SpO2:  [98 %-100 %] 100 % (12/29 1005)  Intake/Output from previous day: 12/28 0701 - 12/29 0700 In: 1100 [P.O.:600; I.V.:400; IV Piggyback:100] Out: 1600 [Urine:1600] Intake/Output this shift:     Basename 04/11/12 0455 04/09/12 1539  HGB 13.8 14.4    Basename 04/11/12 0455 04/09/12 1539  WBC 12.7* 14.2*  RBC 5.40 5.65  HCT 47.5 49.9  PLT 104* 123*    Basename 04/11/12 0455 04/09/12 1539  NA 136 137  K 3.3* 3.8  CL 99 100  CO2 24 24  BUN 22 27*  CREATININE 1.21 1.30  GLUCOSE 89 84  CALCIUM 8.9 9.1   No results found for this basename: LABPT:2,INR:2 in the last 72 hours  Awake, alert nontoxic Both fingers redressed they look better No fluid accumulations skin looks better  Assessment/Plan: Right long and left index finger infections  Continue iv abx Will try for discharge tomorrow on oral abx and home wound care with advance Have pt see tomorrow for hydrotherapy   Sharma Covert 04/11/2012, 12:06 PM

## 2012-04-12 ENCOUNTER — Encounter (HOSPITAL_COMMUNITY): Payer: Self-pay | Admitting: Orthopedic Surgery

## 2012-04-12 LAB — CBC
Platelets: 95 10*3/uL — ABNORMAL LOW (ref 150–400)
RBC: 5.25 MIL/uL (ref 4.22–5.81)
RDW: 20.4 % — ABNORMAL HIGH (ref 11.5–15.5)
WBC: 13.3 10*3/uL — ABNORMAL HIGH (ref 4.0–10.5)

## 2012-04-12 LAB — GLUCOSE, CAPILLARY
Glucose-Capillary: 95 mg/dL (ref 70–99)
Glucose-Capillary: 161 mg/dL — ABNORMAL HIGH (ref 70–99)

## 2012-04-12 MED ORDER — SULFAMETHOXAZOLE-TMP DS 800-160 MG PO TABS
1.0000 | ORAL_TABLET | Freq: Two times a day (BID) | ORAL | Status: DC
  Administered 2012-04-12 – 2012-04-13 (×2): 1 via ORAL
  Filled 2012-04-12 (×4): qty 1

## 2012-04-12 MED ORDER — SULFAMETHOXAZOLE-TMP DS 800-160 MG PO TABS: 1.0000 | ORAL_TABLET | Freq: Two times a day (BID) | ORAL | Status: DC

## 2012-04-12 MED ORDER — TRIAMTERENE-HCTZ 75-50 MG PO TABS
1.0000 | ORAL_TABLET | Freq: Every day | ORAL | Status: DC
  Administered 2012-04-13: 1 via ORAL
  Filled 2012-04-12 (×2): qty 1

## 2012-04-12 NOTE — Progress Notes (Addendum)
Pt presents with wounds on each hand. Placed call to Dr. Glenna Durand office for order clarification [id number]. Searching for clarification as to which extremity wound(s); left, right, or both.  Additionally; is this order for whirlpool or Pulse lavage?   Charlotte Crumb, PT DPT  [phone]

## 2012-04-12 NOTE — Care Management Note (Signed)
  Page 1 of 1   04/12/2012     11:32:57 AM   CARE MANAGEMENT NOTE 04/12/2012  Patient:  Donald Dudley, Donald Dudley   Account Number:  [id number]  Date Initiated:  04/12/2012  Documentation initiated by:  Ronny Flurry  Subjective/Objective Assessment:     Action/Plan:   Anticipated DC Date:  04/12/2012   Anticipated DC Plan:  HOME W HOME HEALTH SERVICES         Choice offered to / List presented to:  [name]        HH arranged  HH-1 RN      [facility] agency  [facility]   Status of service:  In process, will continue to follow Medicare Important Message given?   (If response is "NO", the following Medicare IM given date fields will be blank) Date Medicare IM given:   Date Additional Medicare IM given:    Discharge Disposition:    Per UR Regulation:  Reviewed for med. necessity/level of care/duration of stay  If discussed at Long Length of Stay Meetings, dates discussed:    Comments:  04-12-12 Patient would like hospital bed for home . Called DR Glenna Durand office for an order , waiting on call back . Ronny Flurry RN BSN [phone]

## 2012-04-12 NOTE — Progress Notes (Signed)
Rehab Admissions Coordinator Note:  Patient was screened by Clois Dupes for appropriateness for an Inpatient Acute Rehab Consult.  [employer] Medicare will not approve an inpt rehab admission for this diagnosis.  At this time, we are recommending Skilled Nursing Facility vs Home health.  Clois Dupes, RN 04/12/2012, 8:44 AM  I can be reached at [phone].

## 2012-04-12 NOTE — Progress Notes (Signed)
Physical Therapy Treatment Patient Details Name: Donald Dudley MRN: [id number] DOB: 1940-10-24 Today's Date: 04/12/2012 Time: 9518-8416 PT Time Calculation (min): 30 min  PT Assessment / Plan / Recommendation Comments on Treatment Session  Pt progressing well with PT goals & mobility at this date.  Ambulated ~15' with RW.  Motivated & pleasant.  Pt not approved for CIR.  Pt's wife has made decision to care for husband at home with HHPT services. when MD feels he is medically ready.      Follow Up Recommendations  Home health PT;Supervision for mobility/OOB     Does the patient have the potential to tolerate intense rehabilitation     Barriers to Discharge        Equipment Recommendations       Recommendations for Other Services    Frequency Min 3X/week   Plan Discharge plan needs to be updated;Frequency remains appropriate    Precautions / Restrictions Precautions Precautions: Fall Restrictions Weight Bearing Restrictions: No   Pertinent Vitals/Pain Reports pain in fingers, but did not rate.  Pt does state pain is better controlled today compared to last PT session.      Mobility  Bed Mobility Bed Mobility: Supine to Sit;Sitting - Scoot to Edge of Bed Supine to Sit: 4: Min guard;HOB elevated;With rails Sitting - Scoot to Edge of Bed: 4: Min guard Details for Bed Mobility Assistance: Cues for technique.  Pt relied heavily on rails.   Transfers Transfers: Sit to Stand;Stand to [employer] Transfers Sit to Stand: 4: Min assist;With upper extremity assist;From bed;Other (comment);With armrests (w/c) Stand to Sit: 4: Min guard;With upper extremity assist;With armrests;Other (comment) (w/c) Stand Pivot Transfers: 4: Min guard Details for Transfer Assistance: Pt transferred with prosthesis.  Min (A) to achieve standing from bed & Min Guard for standing from w/c.  Cues for hand placement & technique.  Minor (A) for stability & anterior translation of trunk over BOS.    Ambulation/Gait Ambulation/Gait Assistance: 4: Min guard Ambulation Distance (Feet): 15 Feet Assistive device: Rolling walker Ambulation/Gait Assistance Details: Cues for body positioning inside RW.  Used prosthesis.   Gait Pattern: Step-through pattern;Decreased stride length      PT Goals Acute Rehab PT Goals Time For Goal Achievement: 04/24/12 Potential to Achieve Goals: Good Pt will go Supine/Side to Sit: with supervision PT Goal: Supine/Side to Sit - Progress: Progressing toward goal Pt will go Sit to Supine/Side: with supervision Pt will go Sit to Stand: with supervision PT Goal: Sit to Stand - Progress: Progressing toward goal Pt will go Stand to Sit: with supervision PT Goal: Stand to Sit - Progress: Progressing toward goal Pt will Transfer Bed to Chair/Chair to Bed: with supervision PT Transfer Goal: Bed to Chair/Chair to Bed - Progress: Progressing toward goal Pt will Ambulate: 1 - 15 feet;with min assist;with least restrictive assistive device PT Goal: Ambulate - Progress: Met  Visit Information  Last PT Received On: 04/12/12 Assistance Needed: +1    Subjective Data      Cognition  Overall Cognitive Status: Appears within functional limits for tasks assessed/performed Arousal/Alertness: Awake/alert Orientation Level: Appears intact for tasks assessed Behavior During Session: [facility] for tasks performed    Balance     End of Session PT - End of Session Equipment Utilized During Treatment: Gait belt;Other (comment) (Lt prosthesis) Activity Tolerance: Patient tolerated treatment well Patient left: with call bell/phone within reach;with family/visitor present;Other (comment) (left in w/c) Nurse Communication: Mobility status     Verdell Face, PTA [phone]  04/12/2012   

## 2012-04-12 NOTE — Progress Notes (Signed)
Patient complaining of discomfort to buttocks.  2 areas of skin breakdown noted to right and left buttocks.  Approximately 2 cm round area noted to right and left buttocks, Stage 2 ulcer.  Barrier cream applied to skin.  Foam dressing applied to right and left buttocks.  Patient encouraged to turn from side to side in bed at least every 2 hours.  Will continue to monitor.

## 2012-04-12 NOTE — Progress Notes (Addendum)
[facility] PROGRESS NOTE  Donald Dudley QIO:[id number] DOB: 03-Mar-1941 DOA: 04/09/2012 PCP: Lorenda Peck, MD  Assessment/Plan: Principal Problem:  *Finger infection Active Problems:  A-fib  CAD (coronary artery disease)  HTN (hypertension)  DM2 (diabetes mellitus, type 2)  CKD (chronic kidney disease) stage 3, GFR 30-59 ml/min    Finger infection - POD #2 of I+D of L index and R middle fingers by orthopaedic surgery, patient is on vancomycin started pre-operatively today, pharmacy is dosing this. Agree with vancomycin emperically . If surgical margins clear may discontinue antibiotics. If not would recommend infectious disease consultation prior to discharge, a switch to oral Bactrim for at least 2 weeks. Given his leukocytosis, the 2 different geographic but simultaneous sites of infection, blood cultures negative thus far,. Continuation of vancomycin per orthopedic surgery.CIR;Supervision/Assistance - 24 hour  1. A.Fib - continue home rate control meds, patient is not on coumadin for this, he is followed by a cardiologist as an outpatient. 2. Leukocytosis - secondary to #1 3. CAD - chronic and stable 4. DM2 - will hold off on his 70/30 "sliding scale" he takes at home and instead put him on a med dose SSI while an inpatient here, carb modified medium diet. 5. CKD stage 3 - chronic and stable, his GFR today actually appears to be significantly better than his usual baseline, recheck BMP in AM. 6. HTN - resume outpatient home medications at the time of discharge Our team will followup again tomorrow. Please contact me if I can be of assistance in the meanwhile. Thank you for this consultation.   Chief Complaint: Finger infections  HPI:  Donald Dudley is a 71 yo M POD #0 of I+D of his L index and R middle (long) fingers which had become infected. He states the infections started as "pimples", burst, then continued to not improve and get larger over time. He describes  the drainage he was having as thick, yellow drainage. He states the infections were very painful. 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 Cultures of the wounds were taken and are pending, the patient is currently on vancomycin IV.  His initial lab work up is remarkable for a BGL of 84, and a WBC of 14.2. Hospitalist has been asked to consult for medical management.    Procedures:  POSTOPERATIVE DIAGNOSES:  1. Left index finger gouty infection, superinfection.  2. Right long finger gouty superinfection.  Antibiotics:  None HPI/Subjective:  Blood pressure improved      Objective: Filed Vitals:   04/11/12 1005 04/11/12 1352 04/11/12 2240 04/12/12 0534  BP: 156/70 108/42 102/52 145/95  Pulse: 73 59 61 75  Temp:  99.3 F (37.4 C) 98.2 F (36.8 C) 98.1 F (36.7 C)  TempSrc:  Oral Oral   Resp:  16 17 18   Height:      Weight:      SpO2: 100% 100% 94% 100%    Intake/Output Summary (Last 24 hours) at 04/12/12 1018 Last data filed at 04/12/12 0700  Gross per 24 hour  Intake   1730 ml  Output    300 ml  Net   1430 ml    Exam:  HENT:  Head: Atraumatic.  Nose: Nose normal.  Mouth/Throat: Oropharynx is clear and moist.  Eyes: Conjunctivae are normal. Pupils are equal, round, and reactive to light. No scleral  icterus.  Neck: Neck supple. No tracheal deviation present.  Cardiovascular: Normal rate, regular rhythm, normal heart sounds and intact distal pulses.  Pulmonary/Chest: Effort normal and breath sounds normal. No respiratory distress.  Abdominal: Soft. Normal appearance and bowel sounds are normal. She exhibits no distension. There is no tenderness.  Musculoskeletal: She exhibits no edema and no tenderness.  Neurological: She is alert. No cranial nerve deficit.    Data Reviewed: Basic Metabolic Panel:  Lab 04/11/12 1610 04/09/12 1539  NA 136  137  K 3.3* 3.8  CL 99 100  CO2 24 24  GLUCOSE 89 84  BUN 22 27*  CREATININE 1.21 1.30  CALCIUM 8.9 9.1  MG -- --  PHOS -- --    Liver Function Tests:  Lab 04/11/12 0455  AST 18  ALT 8  ALKPHOS 112  BILITOT 0.6  PROT 5.4*  ALBUMIN 2.7*   No results found for this basename: LIPASE:5,AMYLASE:5 in the last 168 hours No results found for this basename: AMMONIA:5 in the last 168 hours  CBC:  Lab 04/12/12 0545 04/11/12 0455 04/09/12 1539  WBC 13.3* 12.7* 14.2*  NEUTROABS -- -- --  HGB 13.2 13.8 14.4  HCT 46.9 47.5 49.9  MCV 89.3 88.0 88.3  PLT 95* 104* 123*    Cardiac Enzymes: No results found for this basename: CKTOTAL:5,CKMB:5,CKMBINDEX:5,TROPONINI:5 in the last 168 hours BNP (last 3 results) No results found for this basename: PROBNP:3 in the last 8760 hours   CBG:  Lab 04/12/12 0804 04/11/12 2314 04/11/12 1717 04/11/12 1206 04/11/12 0746  GLUCAP 97 114* 117* 123* 100*    Recent Results (from the past 240 hour(s))  SURGICAL PCR SCREEN     Status: Abnormal   Collection Time   04/09/12  3:14 PM      Component Value Range Status Comment   MRSA, PCR POSITIVE (*) NEGATIVE Final    Staphylococcus aureus POSITIVE (*) NEGATIVE Final   CULTURE, ROUTINE-ABSCESS     Status: Normal (Preliminary result)   Collection Time   04/09/12  9:44 PM      Component Value Range Status Comment   Specimen Description ABSCESS RIGHT FINGER   Final    Special Requests NONE   Final    Gram Stain     Final    Value: FEW WBC PRESENT, PREDOMINANTLY PMN     RARE SQUAMOUS EPITHELIAL CELLS PRESENT     NO ORGANISMS SEEN   Culture FEW STAPHYLOCOCCUS AUREUS   Final    Report Status PENDING   Incomplete   ANAEROBIC CULTURE     Status: Normal (Preliminary result)   Collection Time   04/09/12  9:44 PM      Component Value Range Status Comment   Specimen Description ABSCESS RIGHT FINGER   Final    Special Requests NONE   Final    Gram Stain     Final    Value: FEW WBC PRESENT,  PREDOMINANTLY PMN     RARE SQUAMOUS EPITHELIAL CELLS PRESENT     NO ORGANISMS SEEN   Culture     Final    Value: NO ANAEROBES ISOLATED; CULTURE IN PROGRESS FOR 5 DAYS   Report Status PENDING   Incomplete   ANAEROBIC CULTURE     Status: Normal (Preliminary result)   Collection Time   04/09/12  9:48 PM      Component Value Range Status Comment   Specimen Description ABSCESS LEFT FINGER   Final    Special Requests NONE  Final    Gram Stain     Final    Value: RARE WBC PRESENT, PREDOMINANTLY PMN     FEW SQUAMOUS EPITHELIAL CELLS PRESENT     NO ORGANISMS SEEN   Culture     Final    Value: NO ANAEROBES ISOLATED; CULTURE IN PROGRESS FOR 5 DAYS   Report Status PENDING   Incomplete   CULTURE, ROUTINE-ABSCESS     Status: Normal (Preliminary result)   Collection Time   04/09/12  9:48 PM      Component Value Range Status Comment   Specimen Description ABSCESS LEFT FINGER   Final    Special Requests NONE   Final    Gram Stain     Final    Value: RARE WBC PRESENT, PREDOMINANTLY PMN     FEW SQUAMOUS EPITHELIAL CELLS PRESENT     NO ORGANISMS SEEN   Culture FEW STAPHYLOCOCCUS AUREUS   Final    Report Status PENDING   Incomplete   CULTURE, BLOOD (ROUTINE X 2)     Status: Normal (Preliminary result)   Collection Time   04/10/12  1:00 AM      Component Value Range Status Comment   Specimen Description BLOOD RIGHT ARM   Final    Special Requests BOTTLES DRAWN AEROBIC AND ANAEROBIC 10CC EACH   Final    Culture  Setup Time 04/10/2012 12:29   Final    Culture     Final    Value:        BLOOD CULTURE RECEIVED NO GROWTH TO DATE CULTURE WILL BE HELD FOR 5 DAYS BEFORE ISSUING A FINAL NEGATIVE REPORT   Report Status PENDING   Incomplete   CULTURE, BLOOD (ROUTINE X 2)     Status: Normal (Preliminary result)   Collection Time   04/10/12  1:30 AM      Component Value Range Status Comment   Specimen Description BLOOD RIGHT ARM   Final    Special Requests BOTTLES DRAWN AEROBIC AND ANAEROBIC 10CC EACH    Final    Culture  Setup Time 04/10/2012 12:29   Final    Culture     Final    Value:        BLOOD CULTURE RECEIVED NO GROWTH TO DATE CULTURE WILL BE HELD FOR 5 DAYS BEFORE ISSUING A FINAL NEGATIVE REPORT   Report Status PENDING   Incomplete      Studies: Dg Chest 2 View  04/09/2012  *RADIOLOGY REPORT*  Clinical Data: Preoperative evaluation.  Ex-smoker.  CHEST - 2 VIEW  Comparison: 11/06/2010.  Findings: There is moderate cardiac silhouette enlargement unchanged. The patient has undergone previous median sternotomy and coronary artery bypass grafting.  No pulmonary infiltrates or masses are seen. There is flattening of the diaphragm on lateral image with generalized hyperinflation configuration consistent with COPD.  Mediastinal and hilar contours appear stable.  Right hemithorax is slightly more lucent than left hemithorax.  This probably reflects mild rotation.  There has been previous resection of the distal end of the right clavicle.  IMPRESSION: Moderate enlargement of the cardiac silhouette.  Generalized hyperinflation consistent with COPD.  No pulmonary edema, pneumonia, or other acute superimposed process is evident.  Stable chronic findings are described above.   Original Report Authenticated By: Onalee Hua Call     Scheduled Meds:   . allopurinol  100 mg Oral Daily  . ALPRAZolam  0.25 mg Oral Q4H  . atenolol  100 mg Oral Daily  . docusate sodium  100  mg Oral BID  . feeding supplement  237 mL Oral Daily  . hydroxyurea  500 mg Oral 3 times weekly  . insulin aspart  0-15 Units Subcutaneous TID WC  . isosorbide mononitrate  30 mg Oral QPM  . mupirocin ointment   Nasal BID  . Tamsulosin HCl  0.4 mg Oral Daily  . torsemide  20 mg Oral Daily  . vancomycin  500 mg Intravenous Q12H  . vitamin C  1,000 mg Oral Daily   Continuous Infusions:   . lactated ringers 50 mL/hr at 04/12/12 5784    Principal Problem:  *Finger infection Active Problems:  A-fib  CAD (coronary artery disease)   HTN (hypertension)  DM2 (diabetes mellitus, type 2)  CKD (chronic kidney disease) stage 3, GFR 30-59 ml/min    Time spent: 40 minutes   [facility]  [facility] Pager [phone]. If 8PM-8AM, please contact night-coverage at [email or web address], password [facility] 04/12/2012, 10:18 AM  LOS: 3 days

## 2012-04-12 NOTE — Clinical Social Work Note (Signed)
Clinical Social Worker met with patient and spouse at bedside to confirm discharge plans of [facility] vs SNF. Spouse reported that she will manage patient at home with home health services. Wife expressed interest in a hospital bed, and CSW informed her that CM will be notified of request. CSW to sign off, as social work intervention is no longer needed.   Rozetta Nunnery MSW, [employer] [phone]

## 2012-04-12 NOTE — Discharge Summary (Signed)
Physician Discharge Summary  Patient ID: Donald Dudley MRN: [id number] DOB/AGE: [birth year] 71 y.o.  Admit date: 04/09/2012 Discharge date: 04/13/2012  Admission Diagnoses: Infected Right Middle finger Infected Left Index finger Past Medical History  Diagnosis Date  . S/P BKA (below knee amputation)   . History of cholecystectomy   . Hx of CABG   . PVD (peripheral vascular disease)   . Peripheral neuropathy   . Dyslipidemia   . Arthritis   . Hyperlipidemia   . Hypercholesterolemia   . Chest pain   . CAD (coronary artery disease)     CABG - 1993  /  PTCA - 2004  /  Cath - 2006  . Gout     takes Allopurinol daily  . Chronic kidney disease, stage III (moderate)   . CLL (chronic lymphocytic leukemia)   . CHF (congestive heart failure)   . Bilateral renal artery stenosis     status post stents  . Polycythemia   . Hypertension     takes Atenolol,Maxzide,and Isosorbide daily  . Atrial fibrillation     not on Coumadin  . MI (myocardial infarction) 1991  . Bruises easily   . Constipation     related to pain meds-takes OTC stool softener daily  . Peripheral edema     takes Torsemide prn  . Urinary frequency   . Enlarged prostate     takes flomax daily  . Diabetes mellitus     takes Novolin 70/30;average fasting 100-120  . Anxiety     takes Xanax daily  . Insomnia     Ambien prn;but none if over a yr  . Anginal pain     Take isosorbide  . Shingles rash     Discharge Diagnoses:  Principal Problem:  *Finger infection Active Problems:  A-fib  CAD (coronary artery disease)  HTN (hypertension)  DM2 (diabetes mellitus, type 2)  CKD (chronic kidney disease) stage 3, GFR 30-59 ml/min   Surgeries: Procedure(s): IRRIGATION AND DEBRIDEMENT EXTREMITY on 04/09/2012    Consultants:  Internal medicine  Discharged Condition: Improved  Hospital Course: Donald Dudley is an 71 y.o. male who was admitted 04/09/2012 with a chief complaint of No chief complaint on  file. , and found to have a diagnosis of Infected Right Middle finger Infected Left Index finger.  They were brought to the operating room on 04/09/2012 and underwent Procedure(s): IRRIGATION AND DEBRIDEMENT EXTREMITY.    They were given perioperative antibiotics: Anti-infectives     Start     Dose/Rate Route Frequency Ordered Stop   04/11/12 0000  sulfamethoxazole-trimethoprim (BACTRIM DS) 800-160 MG per tablet       1 tablet Oral 2 times daily 04/11/12 1213     04/10/12 1000   vancomycin (VANCOCIN) 500 mg in sodium chloride 0.9 % 100 mL IVPB        500 mg 100 mL/hr over 60 Minutes Intravenous Every 12 hours 04/10/12 0017     04/09/12 1513   vancomycin (VANCOCIN) IVPB 1000 mg/200 mL premix        1,000 mg 200 mL/hr over 60 Minutes Intravenous 60 min pre-op 04/09/12 1513 04/09/12 2049        .  They were given sequential compression devices, early ambulation, and Other (comment)ambulation for DVT prophylaxis.  Recent vital signs: Patient Vitals for the past 24 hrs:  BP Temp Temp src Pulse Resp SpO2  04/12/12 1346 118/45 mmHg 97.3 F (36.3 C) Oral 62  20  98 %  04/12/12 0534  145/95 mmHg 98.1 F (36.7 C) - 75  18  100 %  04/11/12 2240 102/52 mmHg 98.2 F (36.8 C) Oral 61  17  94 %  .  Recent laboratory studies: No results found.  Discharge Medications:     Medication List     As of 04/12/2012  6:35 PM    STOP taking these medications         HYDROcodone-acetaminophen 10-650 MG per tablet   Commonly known as: LORCET      TAKE these medications         allopurinol 100 MG tablet   Commonly known as: ZYLOPRIM   Take 100 mg by mouth daily.      ALPRAZolam 0.25 MG tablet   Commonly known as: XANAX   Take 0.25 mg by mouth every 4 (four) hours.      atenolol 100 MG tablet   Commonly known as: TENORMIN   Take 100 mg by mouth daily.      BAYER CONTOUR TEST test strip   Generic drug: glucose blood   as needed.      bisacodyl 5 MG EC tablet   Generic drug:  bisacodyl   Take 5 mg by mouth daily as needed. For constipation      FLOMAX 0.4 MG Caps   Generic drug: Tamsulosin HCl   Take 0.4 mg by mouth daily.      hydroxyurea 500 MG capsule   Commonly known as: HYDREA   Take 500 mg by mouth 3 (three) times a week. Monday, Wednesday and Friday      insulin NPH-insulin regular (70-30) 100 UNIT/ML injection   Commonly known as: NOVOLIN 70/30   Inject 20-58 Units into the skin 2 (two) times daily. Sliding scale      isosorbide mononitrate 30 MG 24 hr tablet   Commonly known as: IMDUR   Take 30 mg by mouth every evening.      Lancets 28G Misc   as needed.      methocarbamol 500 MG tablet   Commonly known as: ROBAXIN   Take 500 mg by mouth 4 (four) times daily.      oxyCODONE-acetaminophen 5-325 MG per tablet   Commonly known as: PERCOCET/ROXICET   Take 1-2 tablets by mouth every 4 (four) hours as needed for pain.      SECURA PROTECTIVE Oint   Generic drug: Petrolatum   Apply 1 application topically at bedtime. For bed sores      sulfamethoxazole-trimethoprim 800-160 MG per tablet   Commonly known as: BACTRIM DS,SEPTRA DS   Take 1 tablet by mouth 2 (two) times daily.      torsemide 20 MG tablet   Commonly known as: DEMADEX   Take 20 mg by mouth daily.      triamterene-hydrochlorothiazide 75-50 MG per tablet   Commonly known as: MAXZIDE   Take 1 tablet by mouth daily.        Diagnostic Studies: Dg Chest 2 View  04/09/2012  *RADIOLOGY REPORT*  Clinical Data: Preoperative evaluation.  Ex-smoker.  CHEST - 2 VIEW  Comparison: 11/06/2010.  Findings: There is moderate cardiac silhouette enlargement unchanged. The patient has undergone previous median sternotomy and coronary artery bypass grafting.  No pulmonary infiltrates or masses are seen. There is flattening of the diaphragm on lateral image with generalized hyperinflation configuration consistent with COPD.  Mediastinal and hilar contours appear stable.  Right hemithorax is  slightly more lucent than left hemithorax.  This probably reflects mild rotation.  There has been previous resection of the distal end of the right clavicle.  IMPRESSION: Moderate enlargement of the cardiac silhouette.  Generalized hyperinflation consistent with COPD.  No pulmonary edema, pneumonia, or other acute superimposed process is evident.  Stable chronic findings are described above.   Original Report Authenticated By: Onalee Hua Call     They benefited maximally from their hospital stay and there were no complications.     Disposition: home with home health     Follow-up Information    Follow up with Sharma Covert, MD. Schedule an appointment as soon as possible for a visit in 5 days.   Contact information:   [address] [place] [address] [place] [address] [phone]           Signed: Sharma Covert 04/12/2012, 6:35 PM

## 2012-04-13 LAB — CULTURE, ROUTINE-ABSCESS

## 2012-04-13 LAB — BASIC METABOLIC PANEL
CO2: 25 mEq/L (ref 19–32)
Calcium: 8.6 mg/dL (ref 8.4–10.5)
Chloride: 102 mEq/L (ref 96–112)
Glucose, Bld: 77 mg/dL (ref 70–99)
Sodium: 138 mEq/L (ref 135–145)

## 2012-04-13 LAB — CBC
Hemoglobin: 13.1 g/dL (ref 13.0–17.0)
MCH: 25.7 pg — ABNORMAL LOW (ref 26.0–34.0)
MCV: 88.2 fL (ref 78.0–100.0)
Platelets: 80 10*3/uL — ABNORMAL LOW (ref 150–400)
RBC: 5.09 MIL/uL (ref 4.22–5.81)
WBC: 12.1 10*3/uL — ABNORMAL HIGH (ref 4.0–10.5)

## 2012-04-13 LAB — GLUCOSE, CAPILLARY: Glucose-Capillary: 73 mg/dL (ref 70–99)

## 2012-04-13 NOTE — Progress Notes (Signed)
Physical Therapy Wound Treatment Patient Details  Name: Donald Dudley MRN: [id number] Date of Birth: Jul 31, 1940  Today's Date: 04/13/2012 Time: 4098-1191 Time Calculation (min): 41 min  Subjective     Pain Score: Pain Score:   7  Wound Assessment  Pressure Ulcer 04/12/12 Stage II -  Partial thickness loss of dermis presenting as a shallow open ulcer with a red, pink wound bed without slough. (Active)  State of Healing Early/partial granulation 04/12/2012  2:30 PM  Site / Wound Assessment Clean;Dry 04/12/2012  2:30 PM  Peri-wound Assessment Intact 04/12/2012  2:30 PM  Wound Length (cm) 2 cm 04/12/2012  2:30 PM  Margins Unattacted edges (unapproximated) 04/12/2012  2:30 PM  Drainage Amount None 04/12/2012  2:30 PM  Treatment Cleansed 04/12/2012  2:30 PM  Dressing Type Foam 04/12/2012  8:30 PM  Dressing Clean;Dry;Intact 04/12/2012  8:30 PM     Wound 09/11/11 Amputation Left (Active)     Wound 04/13/12 Left Left Index Finger (Active)  Site / Wound Assessment Black;Red;Yellow;Painful;Granulation tissue 04/13/2012  9:00 AM  % Wound base Red or Granulating 30% 04/13/2012  9:00 AM  % Wound base Yellow 40% 04/13/2012  9:00 AM  % Wound base Black 25% 04/13/2012  9:00 AM  % Wound base Other (Comment) 5% 04/13/2012  9:00 AM  Peri-wound Assessment Erythema (blanchable);Maceration;Pink 04/13/2012  9:00 AM  Wound Length (cm) 5 cm 04/13/2012  9:00 AM  Wound Width (cm) 4.5 cm 04/13/2012  9:00 AM  Wound Depth (cm) 0.01 cm 04/13/2012  9:00 AM  Closure Surface sutures 04/13/2012  9:00 AM  Drainage Amount Minimal 04/13/2012  9:00 AM  Drainage Description Serosanguineous;Serous;No odor 04/13/2012  9:00 AM  Non-staged Wound Description Partial thickness 04/13/2012  9:00 AM  Treatment Hydrotherapy (Pulse lavage);Packing (Saline gauze) 04/13/2012  9:00 AM  Dressing Type Gauze (Comment);Moist to dry;Impregnated gauze (petrolatum);Tape dressing 04/13/2012  9:00 AM  Dressing Changed New 04/13/2012   9:00 AM  Dressing Status Clean;Intact 04/13/2012  9:00 AM     Wound 04/13/12 Right Middle Finger (Active)  Site / Wound Assessment Black;Granulation tissue;Red;Yellow;Painful 04/13/2012  9:00 AM  % Wound base Red or Granulating 55% 04/13/2012  9:00 AM  % Wound base Yellow 30% 04/13/2012  9:00 AM  % Wound base Black 10% 04/13/2012  9:00 AM  % Wound base Other (Comment) 5% 04/13/2012  9:00 AM  Peri-wound Assessment Erythema (blanchable);Maceration;Edema 04/13/2012  9:00 AM  Wound Length (cm) 6.5 cm 04/13/2012  9:00 AM  Wound Width (cm) 4 cm 04/13/2012  9:00 AM  Wound Depth (cm) 0.01 cm 04/13/2012  9:00 AM  Margins Unattacted edges (unapproximated) 04/13/2012  9:00 AM  Closure Surface sutures 04/13/2012  9:00 AM  Drainage Amount Minimal 04/13/2012  9:00 AM  Drainage Description Serosanguineous;Serous 04/13/2012  9:00 AM  Non-staged Wound Description Partial thickness 04/13/2012  9:00 AM  Treatment Debridement (Selective);Hydrotherapy (Pulse lavage);Packing (Saline gauze) 04/13/2012  9:00 AM  Dressing Type Gauze (Comment);Impregnated gauze (petrolatum);Moist to dry;Tape dressing 04/13/2012  9:00 AM  Dressing Changed New 04/13/2012  9:00 AM  Dressing Status Clean;Intact 04/13/2012  9:00 AM     Incision 04/09/12 Hand Bilateral;Right;Left (Active)  Drainage Amount None 04/12/2012  8:00 AM  Dressing Type Gauze (Comment) 04/12/2012  8:30 PM  Dressing Clean;Dry;Intact 04/12/2012  8:30 PM   Hydrotherapy Pulsed lavage therapy - wound location: left index finger; right middle finger Pulsed Lavage with Suction (psi): 4 psi Pulsed Lavage with Suction - Normal Saline Used: 500 mL   Wound Assessment and Plan  Wound  Therapy - Assess/Plan/Recommendations Wound Therapy - Clinical Statement: Pt presents with wounds on bilateral hands (left index finger; right middle finger); pt underwent I&D to both sites.  Performed PLS on low setting; patient tolerated this but did experience increased pain.  Will  recommend whirlpool for future use.  Feel patient will need daily dressing changes and rec monitor sites of necrotic tissue with follow up wound care. Wound Therapy - Functional Problem List: decreased skin integrity and impaired mobility secondary to deficits with use of hands (B) and other comorbidities. Previous BKA Factors Delaying/Impairing Wound Healing: Altered sensation;Infection - systemic/local;Immobility;Vascular compromise Hydrotherapy Plan: Debridement;Dressing change;Patient/family education;Whirlpool Wound Therapy - Frequency: 3X / week Wound Therapy - Follow Up Recommendations: Wound Care Center;Home health RN Wound Plan: Rec wound care follow up  Wound Therapy Goals- Improve the function of patient's integumentary system by progressing the wound(s) through the phases of wound healing (inflammation - proliferation - remodeling) by: Decrease Necrotic Tissue to: <20% Decrease Necrotic Tissue - Progress: Goal set today Increase Granulation Tissue to: >80% Increase Granulation Tissue - Progress: Goal set today Goals/treatment plan/discharge plan were made with and agreed upon by patient/family: Yes Time For Goal Achievement: 7 days Wound Therapy - Potential for Goals: Good  Goals will be updated until maximal potential achieved or discharge criteria met.  Discharge criteria: when goals achieved, discharge from hospital, MD decision/surgical intervention, no progress towards goals, refusal/missing three consecutive treatments without notification or medical reason.  GP     Fabio Asa 04/13/2012, 10:08 AM  Charlotte Crumb, PT DPT  [phone]

## 2012-04-13 NOTE — Progress Notes (Signed)
Discharge home.

## 2012-04-14 LAB — ANAEROBIC CULTURE

## 2012-04-16 LAB — CULTURE, BLOOD (ROUTINE X 2): Culture: NO GROWTH

## 2012-04-21 NOTE — Progress Notes (Signed)
Physical Therapy Treatment Patient Details Name: Donald Dudley MRN: [id number] DOB: 06-Jul-1940 Today's Date: 04/12/2012 Time: 9147-8295 PT Time Calculation (min): 30 min  PT Assessment / Plan / Recommendation Comments on Treatment Session  Pt progressing well with PT goals & mobility at this date.  Ambulated ~15' with RW.  Motivated & pleasant.  Pt not approved for CIR.  Pt's wife has made decision to care for husband at home with HHPT services. when MD feels he is medically ready.      Follow Up Recommendations  Home health PT;Supervision for mobility/OOB     Does the patient have the potential to tolerate intense rehabilitation     Barriers to Discharge        Equipment Recommendations       Recommendations for Other Services    Frequency Min 3X/week   Plan Discharge plan needs to be updated;Frequency remains appropriate    Precautions / Restrictions Precautions Precautions: Fall Restrictions Weight Bearing Restrictions: No   Pertinent Vitals/Pain Reports pain in fingers, but did not rate.  Pt does state pain is better controlled today compared to last PT session.      Mobility  Bed Mobility Bed Mobility: Supine to Sit;Sitting - Scoot to Edge of Bed Supine to Sit: 4: Min guard;HOB elevated;With rails Sitting - Scoot to Edge of Bed: 4: Min guard Details for Bed Mobility Assistance: Cues for technique.  Pt relied heavily on rails.   Transfers Transfers: Sit to Stand;Stand to [employer] Transfers Sit to Stand: 4: Min assist;With upper extremity assist;From bed;Other (comment);With armrests (w/c) Stand to Sit: 4: Min guard;With upper extremity assist;With armrests;Other (comment) (w/c) Stand Pivot Transfers: 4: Min guard Details for Transfer Assistance: Pt transferred with prosthesis.  Min (A) to achieve standing from bed & Min Guard for standing from w/c.  Cues for hand placement & technique.  Minor (A) for stability & anterior translation of trunk over BOS.     Ambulation/Gait Ambulation/Gait Assistance: 4: Min guard Ambulation Distance (Feet): 15 Feet Assistive device: Rolling walker Ambulation/Gait Assistance Details: Cues for body positioning inside RW.  Used prosthesis.   Gait Pattern: Step-through pattern;Decreased stride length      PT Goals Acute Rehab PT Goals Time For Goal Achievement: 04/24/12 Potential to Achieve Goals: Good Pt will go Supine/Side to Sit: with supervision PT Goal: Supine/Side to Sit - Progress: Progressing toward goal Pt will go Sit to Supine/Side: with supervision Pt will go Sit to Stand: with supervision PT Goal: Sit to Stand - Progress: Progressing toward goal Pt will go Stand to Sit: with supervision PT Goal: Stand to Sit - Progress: Progressing toward goal Pt will Transfer Bed to Chair/Chair to Bed: with supervision PT Transfer Goal: Bed to Chair/Chair to Bed - Progress: Progressing toward goal Pt will Ambulate: 1 - 15 feet;with min assist;with least restrictive assistive device PT Goal: Ambulate - Progress: Met  Visit Information  Last PT Received On: 04/12/12 Assistance Needed: +1    Subjective Data      Cognition  Overall Cognitive Status: Appears within functional limits for tasks assessed/performed Arousal/Alertness: Awake/alert Orientation Level: Appears intact for tasks assessed Behavior During Session: [facility] for tasks performed    Balance     End of Session PT - End of Session Equipment Utilized During Treatment: Gait belt;Other (comment) (Lt prosthesis) Activity Tolerance: Patient tolerated treatment well Patient left: with call bell/phone within reach;with family/visitor present;Other (comment) (left in w/c) Nurse Communication: Mobility status     Verdell Face, PTA  [phone] 04/12/2012  Agree with above assessment  Lewis Shock, PT, DPT Pager #: [phone] Office #: [phone]

## 2012-06-16 ENCOUNTER — Other Ambulatory Visit: Payer: Self-pay | Admitting: Dermatology

## 2012-07-30 ENCOUNTER — Other Ambulatory Visit: Payer: Self-pay | Admitting: *Deleted

## 2012-07-30 ENCOUNTER — Telehealth: Payer: Self-pay | Admitting: *Deleted

## 2012-07-30 DIAGNOSIS — L98499 Non-pressure chronic ulcer of skin of other sites with unspecified severity: Secondary | ICD-10-CM

## 2012-07-30 NOTE — Telephone Encounter (Signed)
Spoke with pt; confirmed that he continues to take Hydrea 500 mg 3x / week. Informed pt that scheduler will be calling to set-up date/time for office visit. Pt verbalized understanding and expressed appreciation.  MD made aware.

## 2012-07-30 NOTE — Telephone Encounter (Signed)
Left Message on home # to call office, (cell # no voicemail)  re: asking pt if still taking Hydrea?  Per Dr. Truett Perna.  POF completed.

## 2012-08-02 ENCOUNTER — Telehealth: Payer: Self-pay | Admitting: Oncology

## 2012-09-02 ENCOUNTER — Other Ambulatory Visit (HOSPITAL_BASED_OUTPATIENT_CLINIC_OR_DEPARTMENT_OTHER): Payer: Medicare Other | Admitting: Lab

## 2012-09-02 ENCOUNTER — Ambulatory Visit (HOSPITAL_BASED_OUTPATIENT_CLINIC_OR_DEPARTMENT_OTHER): Payer: Medicare Other | Admitting: Nurse Practitioner

## 2012-09-02 ENCOUNTER — Telehealth: Payer: Self-pay | Admitting: Oncology

## 2012-09-02 VITALS — BP 152/75 | HR 50 | Temp 97.4°F | Resp 20 | Ht 68.11 in | Wt 153.0 lb

## 2012-09-02 DIAGNOSIS — I739 Peripheral vascular disease, unspecified: Secondary | ICD-10-CM

## 2012-09-02 DIAGNOSIS — D45 Polycythemia vera: Secondary | ICD-10-CM

## 2012-09-02 DIAGNOSIS — L538 Other specified erythematous conditions: Secondary | ICD-10-CM

## 2012-09-02 LAB — CBC WITH DIFFERENTIAL/PLATELET
Basophils Absolute: 0.1 10*3/uL (ref 0.0–0.1)
EOS%: 1 % (ref 0.0–7.0)
Eosinophils Absolute: 0.2 10*3/uL (ref 0.0–0.5)
HGB: 13.6 g/dL (ref 13.0–17.1)
NEUT#: 15.6 10*3/uL — ABNORMAL HIGH (ref 1.5–6.5)
RDW: 17.5 % — ABNORMAL HIGH (ref 11.0–14.6)
lymph#: 0.8 10*3/uL — ABNORMAL LOW (ref 0.9–3.3)

## 2012-09-02 NOTE — Progress Notes (Signed)
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 He was last seen in our office in January 2011. He is seen today to reestablish care.  He continues Hydrea at a current dose of 500 mg 3 times a week. The Hydrea is prescribed by his primary care provider. 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 In general he does not feel well.   Objective: Blood pressure 152/75, pulse 50, temperature 97.4 F (36.3 C), temperature source Oral, resp. rate 20, height 5' 8.11" (1.73 m), weight 153 lb (69.4 kg).  No thrush. Lungs clear. Regular cardiac rhythm. Abdomen soft. Left BKA. He has a prosthesis. Firm edema right leg below the knee.  Lab Results: Lab Results  Component Value Date   WBC 17.6* 09/02/2012   HGB 13.6 09/02/2012   HCT 46.2 09/02/2012   MCV 89.3 09/02/2012   PLT 152 09/02/2012    Chemistry:    Chemistry      Component Value Date/Time   NA 138 04/13/2012 0600   K 3.9 04/13/2012 0600   CL 102 04/13/2012 0600   CO2 25 04/13/2012 0600   BUN 24* 04/13/2012 0600   CREATININE 1.22 04/13/2012 0600      Component Value Date/Time   CALCIUM 8.6 04/13/2012 0600   ALKPHOS 112 04/11/2012 0455   AST 18 04/11/2012 0455   ALT 8 04/11/2012 0455   BILITOT 0.6 04/11/2012 0455       Studies/Results: No results found.  Medications: I have reviewed the patient's current medications.  Assessment/Plan:  1. Polycythemia vera currently on hydroxyurea 500 mg 3 times weekly. He has required phlebotomy in the past. 2. History of coronary artery disease and peripheral vascular disease. 3. Status post left BKA. 4. 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 Improved. 5. History of shingles.  Disposition-Dr. Truett Perna recommends discontinuation of the Hydrea as it is not clear he is driving  clinical benefit from the medication. He will return for labs and a followup visit in 3 months.  Plan reviewed with Dr. Truett Perna.  Lonna Cobb ANP/GNP-BC

## 2012-09-02 NOTE — Telephone Encounter (Signed)
Gave pt appt for lab and MD on August 2014 °

## 2012-09-15 ENCOUNTER — Other Ambulatory Visit: Payer: Self-pay | Admitting: Dermatology

## 2012-12-06 ENCOUNTER — Ambulatory Visit (HOSPITAL_BASED_OUTPATIENT_CLINIC_OR_DEPARTMENT_OTHER): Payer: Medicare Other | Admitting: Oncology

## 2012-12-06 ENCOUNTER — Telehealth: Payer: Self-pay

## 2012-12-06 ENCOUNTER — Other Ambulatory Visit (HOSPITAL_BASED_OUTPATIENT_CLINIC_OR_DEPARTMENT_OTHER): Payer: Medicare Other | Admitting: Lab

## 2012-12-06 VITALS — BP 150/60 | HR 53 | Temp 98.1°F | Resp 18 | Ht 68.11 in | Wt 161.6 lb

## 2012-12-06 DIAGNOSIS — L538 Other specified erythematous conditions: Secondary | ICD-10-CM

## 2012-12-06 DIAGNOSIS — D45 Polycythemia vera: Secondary | ICD-10-CM

## 2012-12-06 DIAGNOSIS — D471 Chronic myeloproliferative disease: Secondary | ICD-10-CM

## 2012-12-06 LAB — CBC WITH DIFFERENTIAL/PLATELET
Basophils Absolute: 0.3 10*3/uL — ABNORMAL HIGH (ref 0.0–0.1)
Eosinophils Absolute: 0.2 10*3/uL (ref 0.0–0.5)
HGB: 13.6 g/dL (ref 13.0–17.1)
MCV: 75.2 fL — ABNORMAL LOW (ref 79.3–98.0)
MONO#: 1.3 10*3/uL — ABNORMAL HIGH (ref 0.1–0.9)
MONO%: 7.5 % (ref 0.0–14.0)
NEUT#: 14.5 10*3/uL — ABNORMAL HIGH (ref 1.5–6.5)
RDW: 19.2 % — ABNORMAL HIGH (ref 11.0–14.6)

## 2012-12-06 NOTE — Telephone Encounter (Signed)
gv and printed appt sched and avs for pt for Feb 2015 °

## 2012-12-06 NOTE — Progress Notes (Signed)
   [facility]    OFFICE PROGRESS NOTE   INTERVAL HISTORY:   He returns as scheduled. He has been maintained off of hydroxyurea since we saw him in May. No bleeding or symptom of venous/arterial thrombosis. He is in a wheelchair for the majority of the day. [name] Reports he has developed a sore at the bottom.  Objective:  Vital signs in last 24 hours:  Blood pressure 150/60, pulse 53, temperature 98.1 F (36.7 C), temperature source Oral, resp. rate 18, height 5' 8.11" (1.73 m), weight 161 lb 9.6 oz (73.301 kg), SpO2 100.00%.    Resp: Lungs clear bilaterally Cardio: Irregular GI: No hepatomegaly, nontender Vascular: Chronic stasis change at the right lower leg  Skin: Purpura at the dorsum of the hands, superficial bilateral sacral decubitus     Lab Results:  Lab Results  Component Value Date   WBC 16.9* 12/06/2012   HGB 13.6 12/06/2012   HCT 46.7 12/06/2012   MCV 75.2* 12/06/2012   PLT 214 12/06/2012   ANC 14.5    Medications: I have reviewed the patient's current medications.  Assessment/Plan: 1. Polycythemia vera , maintained off of hydroxyurea since May of 2014. He has required phlebotomy in the past. 2. History of coronary artery disease and peripheral vascular disease. 3. Status post left BKA. 4. 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 Improved. 5. History of shingles. 6. Sacral decubitus ulcers-we will arrange for a skin care consult   Disposition:  Donald Dudley has been off of hydroxyurea for the past 3 months. Thrombocytopenia has resolved. He will remain off of hydroxyurea. He will return for an office visit and CBC in 6 months.   [name], MD  12/06/2012  3:22 PM

## 2012-12-08 ENCOUNTER — Encounter (HOSPITAL_COMMUNITY): Payer: Self-pay | Admitting: Emergency Medicine

## 2012-12-08 ENCOUNTER — Inpatient Hospital Stay (HOSPITAL_COMMUNITY)
Admission: EM | Admit: 2012-12-08 | Discharge: 2012-12-11 | DRG: 603 | Disposition: A | Discharge status: Home Health | Payer: Medicare Other | Attending: Internal Medicine | Admitting: Internal Medicine

## 2012-12-08 ENCOUNTER — Observation Stay (HOSPITAL_COMMUNITY): Payer: Medicare Other

## 2012-12-08 DIAGNOSIS — F411 Generalized anxiety disorder: Secondary | ICD-10-CM | POA: Diagnosis present

## 2012-12-08 DIAGNOSIS — N179 Acute kidney failure, unspecified: Secondary | ICD-10-CM | POA: Diagnosis present

## 2012-12-08 DIAGNOSIS — M79671 Pain in right foot: Secondary | ICD-10-CM

## 2012-12-08 DIAGNOSIS — N400 Enlarged prostate without lower urinary tract symptoms: Secondary | ICD-10-CM | POA: Diagnosis present

## 2012-12-08 DIAGNOSIS — Z9861 Coronary angioplasty status: Secondary | ICD-10-CM

## 2012-12-08 DIAGNOSIS — Q891 Congenital malformations of adrenal gland: Secondary | ICD-10-CM

## 2012-12-08 DIAGNOSIS — I2510 Atherosclerotic heart disease of native coronary artery without angina pectoris: Secondary | ICD-10-CM | POA: Diagnosis present

## 2012-12-08 DIAGNOSIS — I10 Essential (primary) hypertension: Secondary | ICD-10-CM | POA: Diagnosis present

## 2012-12-08 DIAGNOSIS — M129 Arthropathy, unspecified: Secondary | ICD-10-CM | POA: Diagnosis present

## 2012-12-08 DIAGNOSIS — I4891 Unspecified atrial fibrillation: Secondary | ICD-10-CM | POA: Diagnosis present

## 2012-12-08 DIAGNOSIS — L02619 Cutaneous abscess of unspecified foot: Principal | ICD-10-CM

## 2012-12-08 DIAGNOSIS — N183 Chronic kidney disease, stage 3 unspecified: Secondary | ICD-10-CM | POA: Diagnosis present

## 2012-12-08 DIAGNOSIS — C9110 Chronic lymphocytic leukemia of B-cell type not having achieved remission: Secondary | ICD-10-CM | POA: Diagnosis present

## 2012-12-08 DIAGNOSIS — Z794 Long term (current) use of insulin: Secondary | ICD-10-CM

## 2012-12-08 DIAGNOSIS — Z951 Presence of aortocoronary bypass graft: Secondary | ICD-10-CM

## 2012-12-08 DIAGNOSIS — I739 Peripheral vascular disease, unspecified: Secondary | ICD-10-CM | POA: Diagnosis present

## 2012-12-08 DIAGNOSIS — I498 Other specified cardiac arrhythmias: Secondary | ICD-10-CM | POA: Diagnosis present

## 2012-12-08 DIAGNOSIS — Z87891 Personal history of nicotine dependence: Secondary | ICD-10-CM

## 2012-12-08 DIAGNOSIS — Z79899 Other long term (current) drug therapy: Secondary | ICD-10-CM

## 2012-12-08 DIAGNOSIS — M109 Gout, unspecified: Secondary | ICD-10-CM | POA: Diagnosis present

## 2012-12-08 DIAGNOSIS — L89151 Pressure ulcer of sacral region, stage 1: Secondary | ICD-10-CM

## 2012-12-08 DIAGNOSIS — D45 Polycythemia vera: Secondary | ICD-10-CM

## 2012-12-08 DIAGNOSIS — Z96659 Presence of unspecified artificial knee joint: Secondary | ICD-10-CM

## 2012-12-08 DIAGNOSIS — I252 Old myocardial infarction: Secondary | ICD-10-CM

## 2012-12-08 DIAGNOSIS — R001 Bradycardia, unspecified: Secondary | ICD-10-CM | POA: Clinically undetermined

## 2012-12-08 DIAGNOSIS — L03115 Cellulitis of right lower limb: Secondary | ICD-10-CM | POA: Diagnosis present

## 2012-12-08 DIAGNOSIS — I129 Hypertensive chronic kidney disease with stage 1 through stage 4 chronic kidney disease, or unspecified chronic kidney disease: Secondary | ICD-10-CM | POA: Diagnosis present

## 2012-12-08 DIAGNOSIS — E119 Type 2 diabetes mellitus without complications: Secondary | ICD-10-CM | POA: Diagnosis present

## 2012-12-08 DIAGNOSIS — G609 Hereditary and idiopathic neuropathy, unspecified: Secondary | ICD-10-CM | POA: Diagnosis present

## 2012-12-08 DIAGNOSIS — S88119A Complete traumatic amputation at level between knee and ankle, unspecified lower leg, initial encounter: Secondary | ICD-10-CM | POA: Diagnosis present

## 2012-12-08 DIAGNOSIS — E785 Hyperlipidemia, unspecified: Secondary | ICD-10-CM | POA: Diagnosis present

## 2012-12-08 DIAGNOSIS — N289 Disorder of kidney and ureter, unspecified: Secondary | ICD-10-CM

## 2012-12-08 DIAGNOSIS — I509 Heart failure, unspecified: Secondary | ICD-10-CM | POA: Diagnosis present

## 2012-12-08 LAB — GLUCOSE, CAPILLARY
Glucose-Capillary: 112 mg/dL — ABNORMAL HIGH (ref 70–99)
Glucose-Capillary: 139 mg/dL — ABNORMAL HIGH (ref 70–99)

## 2012-12-08 LAB — CBC WITH DIFFERENTIAL/PLATELET
Eosinophils Absolute: 0.3 10*3/uL (ref 0.0–0.7)
Eosinophils Relative: 2 % (ref 0–5)
HCT: 46.1 % (ref 39.0–52.0)
Hemoglobin: 13.1 g/dL (ref 13.0–17.0)
Lymphs Abs: 0.6 10*3/uL — ABNORMAL LOW (ref 0.7–4.0)
MCH: 22.4 pg — ABNORMAL LOW (ref 26.0–34.0)
MCV: 78.7 fL (ref 78.0–100.0)
Monocytes Absolute: 0.9 10*3/uL (ref 0.1–1.0)
Monocytes Relative: 6 % (ref 3–12)
Platelets: 203 10*3/uL (ref 150–400)
RBC: 5.86 MIL/uL — ABNORMAL HIGH (ref 4.22–5.81)

## 2012-12-08 LAB — BASIC METABOLIC PANEL
BUN: 41 mg/dL — ABNORMAL HIGH (ref 6–23)
CO2: 26 mEq/L (ref 19–32)
Calcium: 9.3 mg/dL (ref 8.4–10.5)
Creatinine, Ser: 2.03 mg/dL — ABNORMAL HIGH (ref 0.50–1.35)
GFR calc non Af Amer: 31 mL/min — ABNORMAL LOW (ref >90)
Glucose, Bld: 124 mg/dL — ABNORMAL HIGH (ref 70–99)

## 2012-12-08 LAB — SODIUM, URINE, RANDOM: Sodium, Ur: 80 mEq/L

## 2012-12-08 LAB — URINALYSIS, ROUTINE W REFLEX MICROSCOPIC
Bilirubin Urine: NEGATIVE
Hgb urine dipstick: NEGATIVE
Ketones, ur: NEGATIVE mg/dL
Nitrite: NEGATIVE
Specific Gravity, Urine: 1.018 (ref 1.005–1.030)
Urobilinogen, UA: 1 mg/dL (ref 0.0–1.0)

## 2012-12-08 LAB — MRSA PCR SCREENING: MRSA by PCR: NEGATIVE

## 2012-12-08 MED ORDER — SODIUM CHLORIDE 0.9 % IJ SOLN
3.0000 mL | Freq: Two times a day (BID) | INTRAMUSCULAR | Status: DC
  Administered 2012-12-10 – 2012-12-11 (×2): 3 mL via INTRAVENOUS

## 2012-12-08 MED ORDER — SODIUM CHLORIDE 0.9 % IV SOLN
INTRAVENOUS | Status: DC
  Administered 2012-12-08 – 2012-12-11 (×8): via INTRAVENOUS

## 2012-12-08 MED ORDER — HYDROMORPHONE HCL PF 1 MG/ML IJ SOLN
1.0000 mg | Freq: Once | INTRAMUSCULAR | Status: AC
  Administered 2012-12-08: 1 mg via INTRAVENOUS
  Filled 2012-12-08: qty 1

## 2012-12-08 MED ORDER — VANCOMYCIN HCL IN DEXTROSE 1-5 GM/200ML-% IV SOLN
1000.0000 mg | Freq: Once | INTRAVENOUS | Status: AC
  Administered 2012-12-08: 1000 mg via INTRAVENOUS
  Filled 2012-12-08: qty 200

## 2012-12-08 MED ORDER — METHOCARBAMOL 500 MG PO TABS
500.0000 mg | ORAL_TABLET | Freq: Four times a day (QID) | ORAL | Status: DC
  Administered 2012-12-08 – 2012-12-09 (×5): 500 mg via ORAL
  Filled 2012-12-08 (×16): qty 1

## 2012-12-08 MED ORDER — VANCOMYCIN HCL 10 G IV SOLR
1250.0000 mg | INTRAVENOUS | Status: DC
  Filled 2012-12-08: qty 1250

## 2012-12-08 MED ORDER — TAMSULOSIN HCL 0.4 MG PO CAPS
0.8000 mg | ORAL_CAPSULE | Freq: Every day | ORAL | Status: DC
  Administered 2012-12-08 – 2012-12-11 (×4): 0.8 mg via ORAL
  Filled 2012-12-08 (×4): qty 2

## 2012-12-08 MED ORDER — BISACODYL 5 MG PO TBEC
5.0000 mg | DELAYED_RELEASE_TABLET | Freq: Every day | ORAL | Status: DC | PRN
  Administered 2012-12-09 – 2012-12-11 (×2): 5 mg via ORAL
  Filled 2012-12-08 (×3): qty 1

## 2012-12-08 MED ORDER — TRIAMTERENE-HCTZ 75-50 MG PO TABS
1.0000 | ORAL_TABLET | Freq: Every day | ORAL | Status: DC
  Filled 2012-12-08: qty 1

## 2012-12-08 MED ORDER — HEPARIN SODIUM (PORCINE) 5000 UNIT/ML IJ SOLN
5000.0000 [IU] | Freq: Three times a day (TID) | INTRAMUSCULAR | Status: DC
  Administered 2012-12-08 – 2012-12-10 (×4): 5000 [IU] via SUBCUTANEOUS
  Filled 2012-12-08 (×12): qty 1

## 2012-12-08 MED ORDER — HYDROMORPHONE HCL PF 1 MG/ML IJ SOLN
0.5000 mg | INTRAMUSCULAR | Status: DC | PRN
  Administered 2012-12-08 – 2012-12-11 (×13): 1 mg via INTRAVENOUS
  Filled 2012-12-08 (×13): qty 1

## 2012-12-08 MED ORDER — ALLOPURINOL 100 MG PO TABS
100.0000 mg | ORAL_TABLET | Freq: Every day | ORAL | Status: DC
  Administered 2012-12-08 – 2012-12-11 (×4): 100 mg via ORAL
  Filled 2012-12-08 (×4): qty 1

## 2012-12-08 MED ORDER — INSULIN ASPART 100 UNIT/ML ~~LOC~~ SOLN
0.0000 [IU] | Freq: Three times a day (TID) | SUBCUTANEOUS | Status: DC
  Administered 2012-12-08 (×2): 1 [IU] via SUBCUTANEOUS
  Administered 2012-12-09: 2 [IU] via SUBCUTANEOUS
  Administered 2012-12-09: 1 [IU] via SUBCUTANEOUS
  Administered 2012-12-09: 2 [IU] via SUBCUTANEOUS
  Administered 2012-12-10: 3 [IU] via SUBCUTANEOUS

## 2012-12-08 MED ORDER — PREDNISONE 20 MG PO TABS
40.0000 mg | ORAL_TABLET | Freq: Every day | ORAL | Status: DC
  Administered 2012-12-08 – 2012-12-11 (×4): 40 mg via ORAL
  Filled 2012-12-08 (×7): qty 2

## 2012-12-08 MED ORDER — TORSEMIDE 20 MG PO TABS
20.0000 mg | ORAL_TABLET | Freq: Every day | ORAL | Status: DC
  Filled 2012-12-08: qty 1

## 2012-12-08 MED ORDER — ISOSORBIDE MONONITRATE ER 30 MG PO TB24
30.0000 mg | ORAL_TABLET | Freq: Every evening | ORAL | Status: DC
  Administered 2012-12-08 – 2012-12-10 (×3): 30 mg via ORAL
  Filled 2012-12-08 (×5): qty 1

## 2012-12-08 MED ORDER — METHYLPREDNISOLONE SODIUM SUCC 125 MG IJ SOLR
125.0000 mg | Freq: Once | INTRAMUSCULAR | Status: AC
  Administered 2012-12-08: 125 mg via INTRAVENOUS
  Filled 2012-12-08: qty 2

## 2012-12-08 MED ORDER — ALPRAZOLAM 0.25 MG PO TABS
0.2500 mg | ORAL_TABLET | ORAL | Status: DC
  Administered 2012-12-08 – 2012-12-11 (×15): 0.25 mg via ORAL
  Filled 2012-12-08 (×16): qty 1

## 2012-12-08 MED ORDER — OXYCODONE-ACETAMINOPHEN 5-325 MG PO TABS
1.0000 | ORAL_TABLET | Freq: Once | ORAL | Status: AC
  Administered 2012-12-08: 1 via ORAL
  Filled 2012-12-08: qty 1

## 2012-12-08 MED ORDER — INSULIN ASPART 100 UNIT/ML ~~LOC~~ SOLN: 0.0000 [IU] | Freq: Three times a day (TID) | SUBCUTANEOUS | Status: DC

## 2012-12-08 MED ORDER — ATENOLOL 50 MG PO TABS
50.0000 mg | ORAL_TABLET | Freq: Every day | ORAL | Status: DC
  Administered 2012-12-08 – 2012-12-11 (×3): 50 mg via ORAL
  Filled 2012-12-08 (×4): qty 1

## 2012-12-08 MED ORDER — ONDANSETRON HCL 4 MG/2ML IJ SOLN
4.0000 mg | Freq: Once | INTRAMUSCULAR | Status: AC
  Administered 2012-12-08: 4 mg via INTRAVENOUS
  Filled 2012-12-08: qty 2

## 2012-12-08 MED ORDER — HEPARIN SODIUM (PORCINE) 5000 UNIT/ML IJ SOLN
5000.0000 [IU] | Freq: Three times a day (TID) | INTRAMUSCULAR | Status: DC
  Filled 2012-12-08 (×3): qty 1

## 2012-12-08 NOTE — ED Notes (Signed)
RN informed unable to find pt's right foot pedal pulse during report.

## 2012-12-08 NOTE — Progress Notes (Signed)
ANTIBIOTIC CONSULT NOTE - INITIAL  Pharmacy Consult for Vancomycin Indication:  cellulitis  Allergies  Allergen Reactions  . Amiodarone Swelling  . Colchicine Swelling    diarrhea  . Crestor [Rosuvastatin Calcium] Swelling  . Lipitor [Atorvastatin Calcium] Swelling  . Lisinopril Other (See Comments)    Unknown reaction  . Lyrica [Pregabalin] Swelling  . Mevacor [Lovastatin] Swelling    GI-bleed   . Micardis [Telmisartan] Other (See Comments)    Unknown reaction  . Neurontin [Gabapentin] Swelling  . Norvasc [Amlodipine Besylate] Swelling  . Potassium Chloride Er Other (See Comments)    Unknown reaction  . Ranolazine Er Other (See Comments)    Unknown reaction  . Zocor [Simvastatin - High Dose] Swelling    Patient Measurements: Height: 5' 8.11" (173 cm) Weight: 160 lb 15 oz (73 kg) IBW/kg (Calculated) : 68.65  Vital Signs: BP: 145/49 mmHg (08/27 0445) Pulse Rate: 56 (08/27 0445) Intake/Output from previous day:   Intake/Output from this shift:    Labs:  Recent Labs  12/06/12 1414 12/08/12 0258  WBC 16.9* 15.3*  HGB 13.6 13.1  PLT 214 203  CREATININE  --  2.03*   Estimated Creatinine Clearance: 31 ml/min (by C-G formula based on Cr of 2.03). No results found for this basename: VANCOTROUGH, VANCOPEAK, VANCORANDOM, GENTTROUGH, GENTPEAK, GENTRANDOM, TOBRATROUGH, TOBRAPEAK, TOBRARND, AMIKACINPEAK, AMIKACINTROU, AMIKACIN,  in the last 72 hours   Microbiology: No results found for this or any previous visit (from the past 720 hour(s)).  Medical History: Past Medical History  Diagnosis Date  . S/P BKA (below knee amputation)   . History of cholecystectomy   . Hx of CABG   . PVD (peripheral vascular disease)   . Peripheral neuropathy   . Dyslipidemia   . Arthritis   . Hyperlipidemia   . Hypercholesterolemia   . Chest pain   . CAD (coronary artery disease)     CABG - 1993  /  PTCA - 2004  /  Cath - 2006  . Gout     takes Allopurinol daily  . Chronic  kidney disease, stage III (moderate)   . CLL (chronic lymphocytic leukemia)   . CHF (congestive heart failure)   . Bilateral renal artery stenosis     status post stents  . Polycythemia   . Hypertension     takes Atenolol,Maxzide,and Isosorbide daily  . Atrial fibrillation     not on Coumadin  . MI (myocardial infarction) 1991  . Bruises easily   . Constipation     related to pain meds-takes OTC stool softener daily  . Peripheral edema     takes Torsemide prn  . Urinary frequency   . Enlarged prostate     takes flomax daily  . Diabetes mellitus     takes Novolin 70/30;average fasting 100-120  . Anxiety     takes Xanax daily  . Insomnia     Ambien prn;but none if over a yr  . Anginal pain     Take isosorbide  . Shingles rash     Medications:  Allopurinol  Xanax  Atenolol  Dulcolax  Norco  Novolin 70/30  Imdur  Robaxin  Flomax  Demadex  Maxzide     Assessment: 72 yo male with heel pain, possible cellulitis, for empiric antibiotics.  Vancomycin 1 g IV given in ED at  0515  Goal of Therapy:  Vancomycin trough level 10-15 mcg/ml  Plan:  Vancomycin 1250 mg IV q48h  Emmons Toth, Gary Fleet 12/08/2012,5:53 AM

## 2012-12-08 NOTE — ED Notes (Signed)
Pitting edema noted to RLE, unable to feel a R pedal pulse.

## 2012-12-08 NOTE — ED Notes (Signed)
Pt states he has had a R swollen leg due to gout for about two years but this time it woke him up in the night because his ankle was in pain from the swelling. RLE edematous and red with dry areas to skin on ankle.

## 2012-12-08 NOTE — ED Notes (Signed)
Pt has pain to the touch on his right lower foot.

## 2012-12-08 NOTE — Progress Notes (Signed)
I have seen and assessed patient and agree Dr. Boston Service assessment and plan. Patient does have acute renal failure as his last creatinine in December of 2013was normal at 1.22. Will check urine sodium and urine creatinine. Renal ultrasound which was done that shows concern for adrenal lesion on the right kidney. Hydrate with IV fluids. MRI of the abdomen for further evaluation of adrenal lesion. Continue empiric IV antibiotics for possible cellulitis and steroids for possible acute gouty flare. Follow.

## 2012-12-08 NOTE — Progress Notes (Signed)
Admission note: Late Entry  Arrival Method: From the ED at 7am Mental Status: A&Ox4 Telemetry: Placed on box 6E25 by night shift RN.  Skin: Scabs on arms/legs. Many ecchymotic areas on arms/legs. Left BKA. Reddened (blanchable) areas on bilat hips.  Stage 1 on sacrum/scrotum/buttocks - unable to tell if there are opened areas due to a very heavy layer of moisture barrier cream that wife had just applied.  Tubes: N/A IV: LPFA 8/27 NSL Pain: Will medicate as needed.  Family: Spouse at bedside during breakfast. Living Situation: Home with spouse. Safety Measures: Bed alarm in place. Call bell within reach. 6E Orientation: Oriented to unit and surroundings. Educated on call bell use.

## 2012-12-08 NOTE — ED Notes (Signed)
Admitting doctor at bedside 

## 2012-12-08 NOTE — Progress Notes (Signed)
Utilization review completed.  

## 2012-12-08 NOTE — H&P (Signed)
[facility] History and Physical  Donald Dudley ZOX:[id number] DOB: 1941-02-20 DOA: 12/08/2012  Referring physician: ED PCP: Lorenda Peck, MD   Chief Complaint: Heel pain  HPI: Donald Dudley is a 72 y.o. male who presents to the ED with onset of severe pain in his R heel.  Symptoms onset at around 10 PM last night, he states pain is associated with erythema and typical of his gout attacks he has had in the past including gout attacks in his heel.  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  His WBC in the ED is 15k but this appears to be a chronic elevation.  Given this history, EDP is (quite understandably) reluctant to say for certain that this erythema and tenderness of his heel is gout and not cellulitis, and as a result requests admission for empiric treatment of both.  Review of Systems: 12 systems reviewed and otherwise negative.  Past Medical History  Diagnosis Date  . S/P BKA (below knee amputation)   . History of cholecystectomy   . Hx of CABG   . PVD (peripheral vascular disease)   . Peripheral neuropathy   . Dyslipidemia   . Arthritis   . Hyperlipidemia   . Hypercholesterolemia   . Chest pain   . CAD (coronary artery disease)     CABG - 1993  /  PTCA - 2004  /  Cath - 2006  . Gout     takes Allopurinol daily  . Chronic kidney disease, stage III (moderate)   . CLL (chronic lymphocytic leukemia)   . CHF (congestive heart failure)   . Bilateral renal artery stenosis     status post stents  . Polycythemia   . Hypertension     takes Atenolol,Maxzide,and Isosorbide daily  . Atrial fibrillation     not on Coumadin  . MI (myocardial infarction) 1991  . Bruises easily   . Constipation     related to pain meds-takes OTC stool softener daily  .  Peripheral edema     takes Torsemide prn  . Urinary frequency   . Enlarged prostate     takes flomax daily  . Diabetes mellitus     takes Novolin 70/30;average fasting 100-120  . Anxiety     takes Xanax daily  . Insomnia     Ambien prn;but none if over a yr  . Anginal pain     Take isosorbide  . Shingles rash    Past Surgical History  Procedure Laterality Date  . Coronary angioplasty with stent placement  2004    second diagonal artery -- [name], M.D.   . Cardiac catheterization  2006    Est. EF of 65% -- Diffuse coronary artery disease.  The second diagonal artery that was angioplastied in 2004 is now occluded.  It is no longer a candidate for PTCA since it is now flush occluded.  Normal LV systolic function.  [name], M.D.  . Renal artery stent  2003    Bilateral renal artery stenosis  . Total knee arthroplasty      left  . Distal clavicle excision  2003    SURGEON:  [name], M.D.  . Tendon repair  Left Achilles repair x2  . Coronary artery bypass graft  1993    left internal mammary artery to his LAD artery --   . Coronary artery bypass graft  1994    vein graft failure of in his right coronary artery  . Colonoscopy    . Esophagogastroduodenoscopy    . I&d extremity  09/10/2011    Procedure: IRRIGATION AND DEBRIDEMENT EXTREMITY;  Surgeon: Sherren Kerns, MD;  Location: [facility] OR;  Service: Vascular;  Laterality: Left;  OF BKA  . Pr vein bypass graft,aorto-fem-pop    . Joint replacement  1998    Left knee  . Below knee leg amputation      left  . Cholecystectomy    . I&d extremity  04/09/2012    Procedure: IRRIGATION AND DEBRIDEMENT EXTREMITY;  Surgeon: Sharma Covert, MD;  Location: [facility] OR;  Service: Orthopedics;  Laterality: Bilateral;  right middle finger, left index finger   Social History:  reports that he quit smoking about 36 years ago. His smoking use included Cigarettes. He smoked 0.00 packs per day for 25 years. He has never used  smokeless tobacco. He reports that he does not drink alcohol or use illicit drugs.   Allergies  Allergen Reactions  . Amiodarone Swelling  . Colchicine Swelling    diarrhea  . Crestor [Rosuvastatin Calcium] Swelling  . Lipitor [Atorvastatin Calcium] Swelling  . Lisinopril Other (See Comments)    Unknown reaction  . Lyrica [Pregabalin] Swelling  . Mevacor [Lovastatin] Swelling    GI-bleed   . Micardis [Telmisartan] Other (See Comments)    Unknown reaction  . Neurontin [Gabapentin] Swelling  . Norvasc [Amlodipine Besylate] Swelling  . Potassium Chloride Er Other (See Comments)    Unknown reaction  . Ranolazine Er Other (See Comments)    Unknown reaction  . Zocor [Simvastatin - High Dose] Swelling    Family History  Problem Relation Age of Onset  . Coronary artery disease Father   . Heart disease Father   . Hypertension Father   . Heart attack Father   . Coronary artery disease Mother   . Heart failure Mother   . Dementia Mother   . Heart disease Mother     Heart disease before age 78  . Hypertension Mother   . Heart attack Mother   . Diabetes      siblings  . Hypertension Daughter   . Anesthesia problems Neg Hx   . Hypotension Neg Hx   . Malignant hyperthermia Neg Hx   . Pseudochol deficiency Neg Hx   . Heart disease Brother   . Hypertension Brother     Prior to Admission medications   Medication Sig Start Date End Date Taking? Authorizing Provider  allopurinol (ZYLOPRIM) 100 MG tablet Take 100 mg by mouth daily.   Yes Historical Provider, MD  ALPRAZolam (XANAX) 0.25 MG tablet Take 0.25 mg by mouth every 4 (four) hours.    Yes Historical Provider, MD  atenolol (TENORMIN) 100 MG tablet Take 50 mg by mouth daily.    Yes Historical Provider, MD  bisacodyl (BISACODYL) 5 MG EC tablet Take 5 mg by mouth daily as needed. For constipation   Yes Historical Provider, MD  HYDROcodone-acetaminophen (NORCO) 10-325 MG per tablet Take 1 tablet by mouth every 4 (four) hours as  needed for pain.    Yes Historical Provider, MD  insulin NPH-insulin regular (NOVOLIN 70/30) (70-30) 100 UNIT/ML injection Inject 20-58 Units into the skin 2 (two) times daily. Sliding scale  Yes Historical Provider, MD  isosorbide mononitrate (IMDUR) 30 MG 24 hr tablet Take 30 mg by mouth every evening.    Yes Historical Provider, MD  methocarbamol (ROBAXIN) 500 MG tablet Take 500 mg by mouth 4 (four) times daily.   Yes Historical Provider, MD  Tamsulosin HCl (FLOMAX) 0.4 MG CAPS Take 0.8 mg by mouth daily.    Yes Historical Provider, MD  torsemide (DEMADEX) 20 MG tablet Take 20 mg by mouth daily.    Yes Historical Provider, MD  triamterene-hydrochlorothiazide (MAXZIDE) 75-50 MG per tablet Take 1 tablet by mouth daily.     Yes Historical Provider, MD   Physical Exam: Filed Vitals:   12/08/12 0445  BP: 145/49  Pulse: 56  Resp: 20    General:  NAD, resting comfortably in bed Eyes: PEERLA EOMI ENT: mucous membranes moist Neck: supple w/o JVD Cardiovascular: RRR w/o MRG Respiratory: CTA B Abdomen: soft, nt, nd, bs+ Skin: erythema, tenderness, swelling of the R heel on the plantar (weight bearing) aspect of his foot Musculoskeletal: s/p BKA of the LLE Psychiatric: normal tone and affect Neurologic: AAOx3, grossly non-focal  Labs on Admission:  Basic Metabolic Panel:  Recent Labs Lab 12/08/12 0258  NA 133*  K 4.2  CL 97  CO2 26  GLUCOSE 124*  BUN 41*  CREATININE 2.03*  CALCIUM 9.3   Liver Function Tests: No results found for this basename: AST, ALT, ALKPHOS, BILITOT, PROT, ALBUMIN,  in the last 168 hours No results found for this basename: LIPASE, AMYLASE,  in the last 168 hours No results found for this basename: AMMONIA,  in the last 168 hours CBC:  Recent Labs Lab 12/06/12 1414 12/08/12 0258  WBC 16.9* 15.3*  NEUTROABS 14.5* 13.4*  HGB 13.6 13.1  HCT 46.7 46.1  MCV 75.2* 78.7  PLT 214 203   Cardiac Enzymes: No results found for this basename: CKTOTAL, CKMB,  CKMBINDEX, TROPONINI,  in the last 168 hours  BNP (last 3 results) No results found for this basename: PROBNP,  in the last 8760 hours CBG: No results found for this basename: GLUCAP,  in the last 168 hours  Radiological Exams on Admission: No results found.  EKG: Independently reviewed.  Assessment/Plan Active Problems:   HTN (hypertension)   DM2 (diabetes mellitus, type 2)   CKD (chronic kidney disease) stage 3, GFR 30-59 ml/min   Gout flare   Cellulitis of right foot   Gout   1.  Cellulitis of R heel vs Gout flare of R heel - agree with EDP cannot differentiate, and treating empirically for both gout and cellulitis at this point with prednisone 40mg  po daily for gout flare and vancomycin IV pharm to dose for cellulitis.  Cannot use NSAID for gout given his renal function, avoiding colchicine due to intolerance. 2.  CKD stage 3 - creatinine appears to be right around to slightly elevated from baseline, but does not appear to be in AKI at this point so will continue home meds and monitor while here. 3.  DM2 - holding home "sliding scale" 70/30 and putting patient on low dose SSI while here. 4.  HTN - continue home meds 5.  A.Fib - continue home rate control meds, patient is not on coumadin but he is followed routinely by a cardiologist as an outpatient.   Code Status: Full Code (must indicate code status--if unknown or must be presumed, indicate so) Family Communication: No family in room (indicate person spoken with, if applicable, with phone number if by  telephone) Disposition Plan: Admit to obs (indicate anticipated LOS)  Time spent: 70 min  Clemente Dewey M. [facility] Pager [phone]  If 7PM-7AM, please contact night-coverage [email or web address] Password [facility] 12/08/2012, 5:58 AM

## 2012-12-08 NOTE — ED Provider Notes (Signed)
CSN: [id number]     Arrival date & time 12/08/12  0127 History   First MD Initiated Contact with Patient 12/08/12 0407     Chief Complaint  Patient presents with  . Gout   (Consider location/radiation/quality/duration/timing/severity/associated sxs/prior Treatment) The history is provided by the patient.   72 year old male had onset at about 10 PM of severe pain in his right heel. This pain is typical of attacks of gout he has had in the past. Pain is worse with movement and palpation. Nothing makes it better. He rates his pain at 10/10. He denies fever, chills, sweats. Of note, he is status post left below-the-knee amputation. 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  Past Medical History  Diagnosis Date  . S/P BKA (below knee amputation)   . History of cholecystectomy   . Hx of CABG   . PVD (peripheral vascular disease)   . Peripheral neuropathy   . Dyslipidemia   . Arthritis   . Hyperlipidemia   . Hypercholesterolemia   . Chest pain   . CAD (coronary artery disease)     CABG - 1993  /  PTCA - 2004  /  Cath - 2006  . Gout     takes Allopurinol daily  . Chronic kidney disease, stage III (moderate)   . CLL (chronic lymphocytic leukemia)   . CHF (congestive heart failure)   . Bilateral renal artery stenosis     status post stents  . Polycythemia   . Hypertension     takes Atenolol,Maxzide,and Isosorbide daily  . Atrial fibrillation     not on Coumadin  . MI (myocardial infarction) 1991  . Bruises easily   . Constipation     related to pain meds-takes OTC stool softener daily  . Peripheral edema     takes Torsemide prn  . Urinary frequency   . Enlarged prostate     takes flomax daily  . Diabetes mellitus     takes Novolin 70/30;average fasting 100-120  . Anxiety     takes Xanax daily  . Insomnia     Ambien prn;but none if over a yr  . Anginal pain     Take isosorbide  . Shingles rash    Past Surgical History  Procedure Laterality Date   . Coronary angioplasty with stent placement  2004    second diagonal artery -- [name], M.D.   . Cardiac catheterization  2006    Est. EF of 65% -- Diffuse coronary artery disease.  The second diagonal artery that was angioplastied in 2004 is now occluded.  It is no longer a candidate for PTCA since it is now flush occluded.  Normal LV systolic function.  [name], M.D.  . Renal artery stent  2003    Bilateral renal artery stenosis  . Total knee arthroplasty      left  . Distal clavicle excision  2003    SURGEON:  [name], M.D.  . Tendon repair      Left Achilles repair x2  . Coronary artery bypass graft  1993    left internal mammary artery to his LAD artery --   . Coronary artery bypass graft  1994    vein graft failure of in his right coronary artery  . Colonoscopy    . Esophagogastroduodenoscopy    . I&d extremity  09/10/2011    Procedure: IRRIGATION AND DEBRIDEMENT EXTREMITY;  Surgeon: Sherren Kerns, MD;  Location: [facility]  OR;  Service: Vascular;  Laterality: Left;  OF BKA  . Pr vein bypass graft,aorto-fem-pop    . Joint replacement  1998    Left knee  . Below knee leg amputation      left  . Cholecystectomy    . I&d extremity  04/09/2012    Procedure: IRRIGATION AND DEBRIDEMENT EXTREMITY;  Surgeon: Sharma Covert, MD;  Location: [facility] OR;  Service: Orthopedics;  Laterality: Bilateral;  right middle finger, left index finger   Family History  Problem Relation Age of Onset  . Coronary artery disease Father   . Heart disease Father   . Hypertension Father   . Heart attack Father   . Coronary artery disease Mother   . Heart failure Mother   . Dementia Mother   . Heart disease Mother     Heart disease before age 75  . Hypertension Mother   . Heart attack Mother   . Diabetes      siblings  . Hypertension Daughter   . Anesthesia problems Neg Hx   . Hypotension Neg Hx   . Malignant hyperthermia Neg Hx   . Pseudochol deficiency Neg Hx   . Heart  disease Brother   . Hypertension Brother    History  Substance Use Topics  . Smoking status: Former Smoker -- 25 years    Types: Cigarettes    Quit date: 04/14/1976  . Smokeless tobacco: Never Used  . Alcohol Use: No    Review of Systems  All other systems reviewed and are negative.    Allergies  Amiodarone; Colchicine; Crestor; Lipitor; Lisinopril; Lyrica; Mevacor; Micardis; Neurontin; Norvasc; Potassium chloride er; Ranolazine er; and Zocor  Home Medications   Current Outpatient Rx  Name  Route  Sig  Dispense  Refill  . allopurinol (ZYLOPRIM) 100 MG tablet   Oral   Take 100 mg by mouth daily.         Marland Kitchen ALPRAZolam (XANAX) 0.25 MG tablet   Oral   Take 0.25 mg by mouth every 4 (four) hours.          Marland Kitchen atenolol (TENORMIN) 100 MG tablet   Oral   Take 50 mg by mouth daily.          . bisacodyl (BISACODYL) 5 MG EC tablet   Oral   Take 5 mg by mouth daily as needed. For constipation         . HYDROcodone-acetaminophen (NORCO) 10-325 MG per tablet   Oral   Take 1 tablet by mouth every 4 (four) hours as needed for pain.          Marland Kitchen insulin NPH-insulin regular (NOVOLIN 70/30) (70-30) 100 UNIT/ML injection   Subcutaneous   Inject 20-58 Units into the skin 2 (two) times daily. Sliding scale         . isosorbide mononitrate (IMDUR) 30 MG 24 hr tablet   Oral   Take 30 mg by mouth every evening.          . methocarbamol (ROBAXIN) 500 MG tablet   Oral   Take 500 mg by mouth 4 (four) times daily.         . Tamsulosin HCl (FLOMAX) 0.4 MG CAPS   Oral   Take 0.8 mg by mouth daily.          Marland Kitchen torsemide (DEMADEX) 20 MG tablet   Oral   Take 20 mg by mouth daily.          Marland Kitchen triamterene-hydrochlorothiazide (MAXZIDE) 75-50  MG per tablet   Oral   Take 1 tablet by mouth daily.            BP 114/66  Pulse 52  Resp 11  SpO2 98% Physical Exam  Nursing note and vitals reviewed.  72 year old male, who appears to be in pain, but is in no acute distress.  Vital signs are significant for bradycardia with heart rate of 52. Oxygen saturation is 98%, which is normal. Head is normocephalic and atraumatic. PERRLA, EOMI. Oropharynx is clear. Neck is nontender and supple without adenopathy or JVD. Back is nontender and there is no CVA tenderness. Lungs are clear without rales, wheezes, or rhonchi. Chest is nontender. Heart has regular rate and rhythm without murmur. Abdomen is soft, flat, nontender without masses or hepatosplenomegaly and peristalsis is normoactive. Extremities: He is status post left below the knee and the patient. Right heel is erythematous with erythema extending throughout the foot and two thirds the way up his calf. Skin is indurated over the areas where erythema is present and there is scaling of the skin with some desquamation. There is no significant warmth. Capillary refill was 2 seconds and dorsalis pedis pulses palpable. Skin is warm and dry. Stage I sacral decubiti are present bilaterally. Neurologic: Mental status is normal, cranial nerves are intact, there are no motor or sensory deficits.  ED Course  Procedures (including critical care time) Labs Review Results for orders placed during the hospital encounter of 12/08/12  CBC WITH DIFFERENTIAL      Result Value Range   WBC 15.3 (*) 4.0 - 10.5 K/uL   RBC 5.86 (*) 4.22 - 5.81 MIL/uL   Hemoglobin 13.1  13.0 - 17.0 g/dL   HCT 95.2  84.1 - 32.4 %   MCV 78.7  78.0 - 100.0 fL   MCH 22.4 (*) 26.0 - 34.0 pg   MCHC 28.4 (*) 30.0 - 36.0 g/dL   RDW 40.1 (*) 02.7 - 25.3 %   Platelets 203  150 - 400 K/uL   Neutrophils Relative % 88 (*) 43 - 77 %   Neutro Abs 13.4 (*) 1.7 - 7.7 K/uL   Lymphocytes Relative 4 (*) 12 - 46 %   Lymphs Abs 0.6 (*) 0.7 - 4.0 K/uL   Monocytes Relative 6  3 - 12 %   Monocytes Absolute 0.9  0.1 - 1.0 K/uL   Eosinophils Relative 2  0 - 5 %   Eosinophils Absolute 0.3  0.0 - 0.7 K/uL   Basophils Relative 1  0 - 1 %   Basophils Absolute 0.1  0.0 - 0.1  K/uL  BASIC METABOLIC PANEL      Result Value Range   Sodium 133 (*) 135 - 145 mEq/L   Potassium 4.2  3.5 - 5.1 mEq/L   Chloride 97  96 - 112 mEq/L   CO2 26  19 - 32 mEq/L   Glucose, Bld 124 (*) 70 - 99 mg/dL   BUN 41 (*) 6 - 23 mg/dL   Creatinine, Ser 6.64 (*) 0.50 - 1.35 mg/dL   Calcium 9.3  8.4 - 40.3 mg/dL   GFR calc non Af Amer 31 (*) >90 mL/min   GFR calc Af Amer 35 (*) >90 mL/min   MDM   1. Heel pain, right   2. Polycythemia vera   3. Sacral decubitus ulcer, stage I   4. Renal insufficiency    Right heel pain which may be gout findings are also worrisome for cellulitis. He  is given hydromorphone for pain and is empirically started on vancomycin for possible cellulitis. Uric acid level be checked but is not going to be able to differentiate between gout and cellulitis. Sedimentation rate will be obtained also we'll not reliably differentiated the two conditions. I am planning on hospital admission for IV antibiotics, steroids, and narcotics. Case is discussed with Dr. Julian Reil who is coming to see the patient.    Dione Booze, MD 12/08/12 [phone]

## 2012-12-09 ENCOUNTER — Other Ambulatory Visit: Payer: Self-pay

## 2012-12-09 DIAGNOSIS — I498 Other specified cardiac arrhythmias: Secondary | ICD-10-CM

## 2012-12-09 DIAGNOSIS — R001 Bradycardia, unspecified: Secondary | ICD-10-CM | POA: Clinically undetermined

## 2012-12-09 DIAGNOSIS — N179 Acute kidney failure, unspecified: Secondary | ICD-10-CM

## 2012-12-09 LAB — BASIC METABOLIC PANEL
CO2: 23 mEq/L (ref 19–32)
Calcium: 8.8 mg/dL (ref 8.4–10.5)
Chloride: 97 mEq/L (ref 96–112)
Potassium: 4.6 mEq/L (ref 3.5–5.1)
Sodium: 136 mEq/L (ref 135–145)

## 2012-12-09 LAB — URINE CULTURE

## 2012-12-09 LAB — GLUCOSE, CAPILLARY

## 2012-12-09 LAB — CBC
MCH: 22.5 pg — ABNORMAL LOW (ref 26.0–34.0)
Platelets: 208 10*3/uL (ref 150–400)
RBC: 6.14 MIL/uL — ABNORMAL HIGH (ref 4.22–5.81)
WBC: 27.9 10*3/uL — ABNORMAL HIGH (ref 4.0–10.5)

## 2012-12-09 NOTE — Progress Notes (Signed)
Occupational Therapy Discharge Patient Details Name: Donald Dudley MRN: [id number] DOB: 1940/04/24 Today's Date: 12/09/2012 Time:  -     Patient discharged from OT services secondary to no acute OT needs identified; however pt does HHOT to address shower stall transfers with DME which has become an issue at home..  Please see latest therapy progress note for current level of functioning and progress toward goals.    Progress and discharge plan discussed with patient and/or caregiver: Patient/Caregiver agrees with plan       Evette Georges [phone] 12/09/2012, 3:47 PM

## 2012-12-09 NOTE — Evaluation (Addendum)
Physical Therapy One Time Evaluation Patient Details Name: Donald Dudley MRN: [id number] DOB: 1940/08/14 Today's Date: 12/09/2012 Time: 2595-6387 PT Time Calculation (min): 28 min  PT Assessment / Plan / Recommendation History of Present Illness  72 y.o. male who presents to the ED with onset of severe pain in his R heel.  Symptoms onset at around 10 PM last night, he states pain is associated with erythema and typical of his gout attacks he has had in the past including gout attacks in his heel.  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  His WBC in the ED is 15k but this appears to be a chronic elevation.  Admitted for treatment of R heel gout vs cellulitis  Clinical Impression  Patient evaluated by Physical Therapy with no further acute PT needs identified. All education has been completed and the patient has no further questions.  Pt agreeable to perform w/c mobility and ambulation to compare to prior to admission and pt reports he feels about baseline.  Pt also states spouse at home and can assist if needed.  Pt states good family support (built his ramp and installed grab bars as one member of family is a Surveyor, minerals). Recommend HHPT for home safety eval and OT reports spouse and pt having difficulty with showers and car transfers.  No DME needs identified at this time, and pt agreeable for PT to sign off acutely. Thank you for this referral.     PT Assessment  All further PT needs can be met in the next venue of care    Follow Up Recommendations  Home health PT    Does the patient have the potential to tolerate intense rehabilitation      Barriers to Discharge   HHPT for home safety eval    Equipment Recommendations  None recommended by PT    Recommendations for Other  Services     Frequency      Precautions / Restrictions Precautions Precautions: None Required Braces or Orthoses: Other Brace/Splint Other Brace/Splint: L prosthesis Restrictions Weight Bearing Restrictions: No   Pertinent Vitals/Pain No c/o pain during session      Mobility  Bed Mobility Bed Mobility: Supine to Sit Supine to Sit: 6: Modified independent (Device/Increase time);HOB elevated Transfers Transfers: Sit to Stand;Stand to Sit;Stand Pivot Transfers Sit to Stand: 4: Min guard;5: Supervision;From bed;From chair/3-in-1;With upper extremity assist Stand to Sit: 4: Min guard;5: Supervision;To chair/3-in-1;With upper extremity assist Stand Pivot Transfers: 5: Supervision;4: Min guard Details for Transfer Assistance: min/guard initially however supervision level after first transfer Ambulation/Gait Ambulation/Gait Assistance: 4: Min guard;5: Supervision Ambulation Distance (Feet): 30 Feet Assistive device: Rolling walker Ambulation/Gait Assistance Details: pt wished to attempt ambulation as he walks short distance in home and reports he is at baseline, states he has to look down to see foot placement due to poor sensation and goes slowly Gait Pattern: Step-through pattern;Decreased stride length;Trunk flexed Wheelchair Mobility Wheelchair Mobility: Yes Wheelchair Assistance: 5: Financial planner Details (indicate cue type and reason): occasional cues for use of brakes, pt reports he fatigues quickly, only uses manual w/c in home and power chair for outdoors/community Wheelchair Propulsion: Both upper extremities Wheelchair Parts Management: Supervision/cueing Distance: 140    Exercises  PT Diagnosis:    PT Problem List:   PT Treatment Interventions:       PT Goals(Current goals can be found in the care plan section) Acute Rehab PT Goals PT Goal Formulation: No goals set, d/c therapy  Visit Information  Last PT Received On: 12/09/12 Assistance  Needed: +1 History of Present Illness: 72 y.o. male who presents to the ED with onset of severe pain in his R heel.  Symptoms onset at around 10 PM last night, he states pain is associated with erythema and typical of his gout attacks he has had in the past including gout attacks in his heel.  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  His WBC in the ED is 15k but this appears to be a chronic elevation.  Admitted for treatment of R heel gout vs cellulitis       Prior Functioning  Home Living Family/patient expects to be discharged to:: Private residence Living Arrangements: Spouse/significant other Available Help at Discharge: Family;Available 24 hours/day Type of Home: House Home Access: Ramped entrance Home Layout: Two level;Able to live on main level with bedroom/bathroom Home Equipment: Wheelchair - power;Wheelchair - manual;Grab bars - tub/shower;Grab bars - toilet;Shower seat;Walker - Curator - 2 wheels Prior Function Level of Independence: Independent with assistive device(s) Comments: can transfer self independently in/out of wheelchair. Walks with RW ~20 ft with supervision Communication Communication: No difficulties    Cognition  Cognition Arousal/Alertness: Awake/alert Behavior During Therapy: WFL for tasks assessed/performed Overall Cognitive Status: Within Functional Limits for tasks assessed    Extremity/Trunk Assessment Lower Extremity Assessment Lower Extremity Assessment: RLE deficits/detail;LLE deficits/detail RLE Deficits / Details: pt reports poor strength in R LE and decreased knee function due to arthritis however able to transfer and ambulate short distance without difficulty LLE Deficits / Details: L BKA   Balance    End of Session PT - End of  Session Equipment Utilized During Treatment: Gait belt Activity Tolerance: Patient tolerated treatment well Patient left: in chair;with call bell/phone within reach  GP     Donald Dudley,Donald Dudley 12/09/2012, 12:57 PM Donald Dudley, PT, DPT 12/09/2012 Pager: [phone]

## 2012-12-09 NOTE — Progress Notes (Signed)
[facility] PROGRESS NOTE  Donald Dudley XBJ:[id number] DOB: 1941-03-27 DOA: 12/08/2012 PCP: Lorenda Peck, MD  Assessment/Plan: #1 right heel cellulitis versus gout flare Clinical improvement. Likely an acute gouty attack. Patient is currently afebrile. Will discontinue IV vancomycin. Continue steroid burst. PT/OT. Follow.  #2 acute renal failure Questionable etiology. Patient's last creatinine from 04/13/2012 was 1.22. Patient denies any history of chronic kidney disease. Renal ultrasound was negative for hydronephrosis have a concern of possible adrenal mass. Renal function improved with hydration. Continue IV fluids and follow.  #3 abnormal abdominal ultrasound/? Adrenal mass Will get an MRI of the abdomen for further evaluation. Follow for now.  #4 bradycardia Patient states has had some bradycardia at home with heart rates dropping to 30s when he sleeping. Patient's heart rate in the 40s to 50s today per nursing. Patient currently asymptomatic. EKG shows a sinus bradycardia. Hold atenolol for now. Follow.  #5 atrial fibrillation Currently on rate control with atenolol.  Aspirin.  #6 diabetes mellitus Hemoglobin A1c was 5.6 in 04/10/2012. Continue sliding scale insulin.  #7 coronary artery disease/history of CABG/peripheral vascular disease Currently asymptomatic and stable. Continue atenolol, imdur  Code Status: Full Family Communication: Updated patient, no family present. Disposition Plan: Home when medically stable   Consultants:  None  Procedures:  Renal US 12/08/12  Antibiotics:  IV vancomycin 12/08/2012---> 12/09/12  HPI/Subjective: Patient states she's feeling better. Patient with good urine output. Per nursing patient with heart rates in the 40s to the 30s earlier this morning. Patient denies any chest pain. No shortness of breath. Patient is asymptomatic. Patient states at home sometimes on his sleep and heart rate drops to about  35.  Objective: Filed Vitals:   12/09/12 0750  BP: 115/79  Pulse: 87  Temp: 98.2 F (36.8 C)  Resp: 18    Intake/Output Summary (Last 24 hours) at 12/09/12 1418 Last data filed at 12/09/12 1406  Gross per 24 hour  Intake 2984.59 ml  Output    900 ml  Net 2084.59 ml   Filed Weights   12/08/12 0500 12/08/12 0653 12/08/12 2005  Weight: 73 kg (160 lb 15 oz) 72.8 kg (160 lb 7.9 oz) 74.3 kg (163 lb 12.8 oz)    Exam:   General:  NAD  Cardiovascular: Bradycardia  Respiratory: CTAB  Abdomen: soft, nontender, nondistended, positive bowel sounds.  Musculoskeletal: status post left BKA. Right heel decreased warmth, decreased tenderness to palpation.  Data Reviewed: Basic Metabolic Panel:  Recent Labs Lab 12/08/12 0258 12/09/12 0535  NA 133* 136  K 4.2 4.6  CL 97 97  CO2 26 23  GLUCOSE 124* 121*  BUN 41* 42*  CREATININE 2.03* 1.87*  CALCIUM 9.3 8.8   Liver Function Tests: No results found for this basename: AST, ALT, ALKPHOS, BILITOT, PROT, ALBUMIN,  in the last 168 hours No results found for this basename: LIPASE, AMYLASE,  in the last 168 hours No results found for this basename: AMMONIA,  in the last 168 hours CBC:  Recent Labs Lab 12/06/12 1414 12/08/12 0258 12/09/12 0535  WBC 16.9* 15.3* 27.9*  NEUTROABS 14.5* 13.4*  --   HGB 13.6 13.1 13.8  HCT 46.7 46.1 47.7  MCV 75.2* 78.7 77.7*  PLT 214 203 208   Cardiac Enzymes: No results found for this basename: CKTOTAL, CKMB, CKMBINDEX, TROPONINI,  in the last 168 hours BNP (last 3 results) No results found for this basename: PROBNP,  in the last 8760 hours CBG:  Recent Labs Lab 12/08/12 0806  12/08/12 1213 12/08/12 1721 12/08/12 2118 12/09/12 0802  GLUCAP 117* 139* 139* 162* 143*    Recent Results (from the past 240 hour(s))  MRSA PCR SCREENING     Status: None   Collection Time    12/08/12  9:05 AM      Result Value Range Status   MRSA by PCR NEGATIVE  NEGATIVE Final   Comment:             The GeneXpert MRSA Assay (FDA     approved for NASAL specimens     only), is one component of a     comprehensive MRSA colonization     surveillance program. It is not     intended to diagnose MRSA     infection nor to guide or     monitor treatment for     MRSA infections.     Studies: US Renal  12/08/2012   *RADIOLOGY REPORT*  Clinical Data: Acute renal failure.  RENAL/URINARY TRACT ULTRASOUND COMPLETE  Comparison:  Chest x-ray 08/11/2010.  Findings:  Right Kidney:  No hydronephrosis.  Well-preserved cortex.  Normal size and parenchymal echotexture without focal abnormalities.  11.0 cm in length.  Left Kidney:  No hydronephrosis.  Well-preserved cortex.  Normal size and parenchymal echotexture. A 0.8 x 0.8 x 0.6 cm hypoechoic lesion extending exophytically from the junction of the upper and interpolar region of the left kidney laterally is indeterminate. 11.9 cm in length.  Bladder:  Urinary bladder is well distended without focal neural abnormalities.  Above the right kidney there is a well-defined heterogeneously hypoechoic/isoechoic lesion which appears to have some internal blood flow measuring 5.2 x 2.6 x 4.9 cm, presumably and an adrenal lesion.  Notably, comparison with CT of 09/16/2006 demonstrates no such adrenal lesion in this region on the examination.  IMPRESSION: 1.  0.8 x 0.8 x 0.6 cm lesion in the right kidney is too small to definitively characterize, but is favored to represent a small cyst. 2.  No hydronephrosis or other acute findings. 3.  5.2 x 2.6 x 4.9 cm solid lesion above the right kidney, presumably an adrenal mass.  This appears to be new compared to prior study 09/16/2006, and warrants further characterization with MRI of the abdomen to exclude neoplasm.  These results will be called to the ordering clinician or representative by the Radiologist Assistant, and communication documented in the PACS Dashboard.   Original Report Authenticated By: Trudie Reed, M.D.     Scheduled Meds: . allopurinol  100 mg Oral Daily  . ALPRAZolam  0.25 mg Oral Q4H  . atenolol  50 mg Oral Daily  . heparin subcutaneous  5,000 Units Subcutaneous Q8H  . insulin aspart  0-9 Units Subcutaneous TID WC  . isosorbide mononitrate  30 mg Oral QPM  . methocarbamol  500 mg Oral QID  . predniSONE  40 mg Oral Q breakfast  . sodium chloride  3 mL Intravenous Q12H  . tamsulosin  0.8 mg Oral Daily  . vancomycin  1,250 mg Intravenous Q48H   Continuous Infusions: . sodium chloride 100 mL/hr at 12/09/12 0542    Active Problems:   A-fib   HTN (hypertension)   DM2 (diabetes mellitus, type 2)   CKD (chronic kidney disease) stage 3, GFR 30-59 ml/min   Gout flare   Cellulitis of right foot   Gout   ARF (acute renal failure)   Bradycardia    Time spent: > 35 mins    Daliana Leverett  [facility] Pager  [phone]. If 7PM-7AM, please contact night-coverage at [email or web address], password [facility] 12/09/2012, 2:18 PM  LOS: 1 day

## 2012-12-09 NOTE — Progress Notes (Signed)
Spoke with Dr. Janee Morn about pt's 10am Atenolol dose with pt's HR sustaining mid 50's. MD stated to wait until 12noon and recheck HR and give med if HR is higher. Will monitor.

## 2012-12-09 NOTE — Progress Notes (Signed)
Pt's HR still sustaining mid 50's. Dr. Janee Morn stated it was fine to hold his atenolol dose today. Will monitor.

## 2012-12-09 NOTE — Progress Notes (Signed)
Pt states that when he voids he feels like he has to "pee a whole gallon" but when he actually voids there's only 100-245mls in the urinal, but states he feels like he's emptying his bladder. Pt voided in the urinal and bladder scan showed . Will monitor.

## 2012-12-10 LAB — GLUCOSE, CAPILLARY
Glucose-Capillary: 106 mg/dL — ABNORMAL HIGH (ref 70–99)
Glucose-Capillary: 210 mg/dL — ABNORMAL HIGH (ref 70–99)

## 2012-12-10 LAB — BASIC METABOLIC PANEL
CO2: 23 mEq/L (ref 19–32)
Calcium: 8.2 mg/dL — ABNORMAL LOW (ref 8.4–10.5)
Glucose, Bld: 120 mg/dL — ABNORMAL HIGH (ref 70–99)
Potassium: 4.2 mEq/L (ref 3.5–5.1)
Sodium: 136 mEq/L (ref 135–145)

## 2012-12-10 NOTE — Progress Notes (Signed)
12/10/2012 bladder scan was done at 1546 and patient had 27cc, per tech  Voided 15 mins later 131 and another bladder scan was done which showed zero. Dr Blinda Leatherwood (nephrology )  and Dr Janee Morn is aware of result. Continue to monitor urine output. Lovie Macadamia (RN).

## 2012-12-10 NOTE — Progress Notes (Signed)
[facility] PROGRESS NOTE  Donald Dudley:[id number] DOB: 02-05-1941 DOA: 12/08/2012 PCP: Lorenda Peck, MD  Assessment/Plan: #1 Acute gout flare Clinical improvement. Likely an acute gouty attack. Patient is currently afebrile. D/C'd IV vancomycin yesterday. Continue steroid burst. PT/OT. Follow.  #2 acute renal failure Questionable etiology. Patient's last creatinine from 04/13/2012 was 1.22. Patient denies any history of chronic kidney disease. Renal ultrasound was negative for hydronephrosis have a concern of possible adrenal mass. Renal function not significantly improved. Consult with renal for further eval and rxcs.  #3 abnormal abdominal ultrasound/? Adrenal mass Will defer on MRI of the abdomen secondary to patient's ARF risk of nephrogenic fibrosing dermopathy. Patient will need to follow up with Dr Georgana Curio of [facility] as outpatient for further evaluation. Follow for now.  #4 bradycardia Patient states has had some bradycardia at home with heart rates dropping to 30s when he sleeping. Patient's improving. Patient currently asymptomatic. Follow.  #5 atrial fibrillation Currently on rate control with atenolol.  Aspirin.  #6 diabetes mellitus Hemoglobin A1c was 5.6 in 04/10/2012. Continue sliding scale insulin.  #7 coronary artery disease/history of CABG/peripheral vascular disease Currently asymptomatic and stable. Continue atenolol, imdur  Code Status: Full Family Communication: Updated patient, no family present. Disposition Plan: Home when medically stable   Consultants:  Renal : Dr Lowell Guitar 12/10/12  Procedures:  Renal US 12/08/12  Antibiotics:  IV vancomycin 12/08/2012---> 12/09/12  HPI/Subjective: Patient states he's feeling better. Patient with good urine output. HR improved. Patient denies any chest pain. No shortness of breath. Patient is asymptomatic.   Objective: Filed Vitals:   12/10/12 0830  BP: 140/55  Pulse: 77  Temp: 98.7 F (37.1  C)  Resp: 18    Intake/Output Summary (Last 24 hours) at 12/10/12 1530 Last data filed at 12/10/12 0900  Gross per 24 hour  Intake 1198.33 ml  Output    701 ml  Net 497.33 ml   Filed Weights   12/08/12 0653 12/08/12 2005 12/09/12 2036  Weight: 72.8 kg (160 lb 7.9 oz) 74.3 kg (163 lb 12.8 oz) 74.5 kg (164 lb 3.9 oz)    Exam:   General:  NAD  Cardiovascular: RRR  Respiratory: CTAB  Abdomen: soft, nontender, nondistended, positive bowel sounds.  Musculoskeletal: status post left BKA. Right heel decreased warmth, decreased tenderness to palpation.  Data Reviewed: Basic Metabolic Panel:  Recent Labs Lab 12/08/12 0258 12/09/12 0535 12/10/12 0505  NA 133* 136 136  K 4.2 4.6 4.2  CL 97 97 101  CO2 26 23 23   GLUCOSE 124* 121* 120*  BUN 41* 42* 47*  CREATININE 2.03* 1.87* 1.99*  CALCIUM 9.3 8.8 8.2*   Liver Function Tests: No results found for this basename: AST, ALT, ALKPHOS, BILITOT, PROT, ALBUMIN,  in the last 168 hours No results found for this basename: LIPASE, AMYLASE,  in the last 168 hours No results found for this basename: AMMONIA,  in the last 168 hours CBC:  Recent Labs Lab 12/06/12 1414 12/08/12 0258 12/09/12 0535  WBC 16.9* 15.3* 27.9*  NEUTROABS 14.5* 13.4*  --   HGB 13.6 13.1 13.8  HCT 46.7 46.1 47.7  MCV 75.2* 78.7 77.7*  PLT 214 203 208   Cardiac Enzymes: No results found for this basename: CKTOTAL, CKMB, CKMBINDEX, TROPONINI,  in the last 168 hours BNP (last 3 results) No results found for this basename: PROBNP,  in the last 8760 hours CBG:  Recent Labs Lab 12/09/12 0802 12/09/12 1204 12/09/12 1705 12/09/12 2120 12/10/12 8119  GLUCAP 143* 161* 158* 134* 110*    Recent Results (from the past 240 hour(s))  URINE CULTURE     Status: None   Collection Time    12/08/12  9:05 AM      Result Value Range Status   Specimen Description URINE, RANDOM   Final   Special Requests NONE   Final   Culture  Setup Time     Final   Value:  12/08/2012 23:49     Performed at [facility] Count     Final   Value: 20,OOO COLONIES/ML     Performed at [facility]   Culture     Final   Value: Multiple bacterial morphotypes present, none predominant. Suggest appropriate recollection if clinically indicated.     Performed at [facility]   Report Status 12/09/2012 FINAL   Final  MRSA PCR SCREENING     Status: None   Collection Time    12/08/12  9:05 AM      Result Value Range Status   MRSA by PCR NEGATIVE  NEGATIVE Final   Comment:            The GeneXpert MRSA Assay (FDA     approved for NASAL specimens     only), is one component of a     comprehensive MRSA colonization     surveillance program. It is not     intended to diagnose MRSA     infection nor to guide or     monitor treatment for     MRSA infections.     Studies: No results found.  Scheduled Meds: . allopurinol  100 mg Oral Daily  . ALPRAZolam  0.25 mg Oral Q4H  . atenolol  50 mg Oral Daily  . heparin subcutaneous  5,000 Units Subcutaneous Q8H  . insulin aspart  0-9 Units Subcutaneous TID WC  . isosorbide mononitrate  30 mg Oral QPM  . methocarbamol  500 mg Oral QID  . predniSONE  40 mg Oral Q breakfast  . sodium chloride  3 mL Intravenous Q12H  . tamsulosin  0.8 mg Oral Daily   Continuous Infusions: . sodium chloride 100 mL/hr at 12/10/12 1211    Active Problems:   A-fib   HTN (hypertension)   DM2 (diabetes mellitus, type 2)   CKD (chronic kidney disease) stage 3, GFR 30-59 ml/min   Gout flare   Cellulitis of right foot   Gout   ARF (acute renal failure)   Bradycardia    Time spent: > 35 mins    [facility]  [facility] Pager [phone]. If 7PM-7AM, please contact night-coverage at [email or web address], password [facility] 12/10/2012, 3:30 PM  LOS: 2 days

## 2012-12-10 NOTE — Progress Notes (Signed)
Donald Dudley is a 72 y.o. male who presented to the ED with onset of severe pain in his R heel on 8/27 and admitted for further eval and treatment.  The w/u is cellulitis v gout.  He is diabetic and s/p left BKA.  He denies NSaid use PTA and none given in hospital.  No contrast exposure.  He does endorse a history of urinary difficulties with frequency and small volume and difficulty initiating a stream.  Past history includes bilateral renal artery stenting in the past. Ultrasound done 8/27 revealed a "well distended" bladder  11 cm right and 11.9cm left kidney with a right suprarenal heterogeneous lesion.  Bladder scan was negative. UA 30mg  protein otherwise neg. Done 8/28 showed 0 ml.  Over course of hospitalization sCreat has been elevated Admission cr on 8/27 was 2.03, 1.87 on 8/28, and 1.99 on 8/29.   Repeat Pre and post PVR today was 27cc, then 0 ( he voided 131cc).   Past Medical History  Diagnosis Date  . S/P BKA (below knee amputation)   . History of cholecystectomy   . Hx of CABG   . PVD (peripheral vascular disease)   . Peripheral neuropathy   . Dyslipidemia   . Arthritis   . Hyperlipidemia   . Hypercholesterolemia   . Chest pain   . CAD (coronary artery disease)     CABG - 1993  /  PTCA - 2004  /  Cath - 2006  . Gout     takes Allopurinol daily  . Chronic kidney disease, stage III (moderate)   . CLL (chronic lymphocytic leukemia)   . CHF (congestive heart failure)   . Bilateral renal artery stenosis     status post stents  . Polycythemia   . Hypertension     takes Atenolol,Maxzide,and Isosorbide daily  . Atrial fibrillation     not on Coumadin  . MI (myocardial infarction) 1991  . Bruises easily   . Constipation     related to pain meds-takes OTC stool softener daily  . Peripheral edema     takes Torsemide prn  . Urinary frequency   . Enlarged prostate     takes flomax daily  . Diabetes mellitus     takes Novolin 70/30;average fasting 100-120  . Anxiety      takes Xanax daily  . Insomnia     Ambien prn;but none if over a yr  . Anginal pain     Take isosorbide  . Shingles rash    Past Surgical History  Procedure Laterality Date  . Coronary angioplasty with stent placement  2004    second diagonal artery -- [name], M.D.   . Cardiac catheterization  2006    Est. EF of 65% -- Diffuse coronary artery disease.  The second diagonal artery that was angioplastied in 2004 is now occluded.  It is no longer a candidate for PTCA since it is now flush occluded.  Normal LV systolic function.  [name], M.D.  . Renal artery stent  2003    Bilateral renal artery stenosis  . Total knee arthroplasty      left  . Distal clavicle excision  2003    SURGEON:  [name], M.D.  . Tendon repair      Left Achilles repair x2  . Coronary artery bypass graft  1993    left internal mammary artery to his LAD artery --   . Coronary artery bypass graft  1994  vein graft failure of in his right coronary artery  . Colonoscopy    . Esophagogastroduodenoscopy    . I&d extremity  09/10/2011    Procedure: IRRIGATION AND DEBRIDEMENT EXTREMITY;  Surgeon: Sherren Kerns, MD;  Location: [facility] OR;  Service: Vascular;  Laterality: Left;  OF BKA  . Pr vein bypass graft,aorto-fem-pop    . Joint replacement  1998    Left knee  . Below knee leg amputation      left  . Cholecystectomy    . I&d extremity  04/09/2012    Procedure: IRRIGATION AND DEBRIDEMENT EXTREMITY;  Surgeon: Sharma Covert, MD;  Location: [facility] OR;  Service: Orthopedics;  Laterality: Bilateral;  right middle finger, left index finger   Social History:  reports that he quit smoking about 36 years ago. His smoking use included Cigarettes. He smoked 0.00 packs per day for 25 years. He has never used smokeless tobacco. He reports that he does not drink alcohol or use illicit drugs. Allergies:  Allergies  Allergen Reactions  . Amiodarone Swelling  . Colchicine Swelling    diarrhea  .  Crestor [Rosuvastatin Calcium] Swelling  . Lipitor [Atorvastatin Calcium] Swelling  . Lisinopril Other (See Comments)    Unknown reaction  . Lyrica [Pregabalin] Swelling  . Mevacor [Lovastatin] Swelling    GI-bleed   . Micardis [Telmisartan] Other (See Comments)    Unknown reaction  . Neurontin [Gabapentin] Swelling  . Norvasc [Amlodipine Besylate] Swelling  . Potassium Chloride Er Other (See Comments)    Unknown reaction  . Ranolazine Er Other (See Comments)    Unknown reaction  . Zocor [Simvastatin - High Dose] Swelling   Family History  Problem Relation Age of Onset  . Coronary artery disease Father   . Heart disease Father   . Hypertension Father   . Heart attack Father   . Coronary artery disease Mother   . Heart failure Mother   . Dementia Mother   . Heart disease Mother     Heart disease before age 92  . Hypertension Mother   . Heart attack Mother   . Diabetes      siblings  . Hypertension Daughter   . Anesthesia problems Neg Hx   . Hypotension Neg Hx   . Malignant hyperthermia Neg Hx   . Pseudochol deficiency Neg Hx   . Heart disease Brother   . Hypertension Brother    Medications:  Scheduled: . allopurinol  100 mg Oral Daily  . ALPRAZolam  0.25 mg Oral Q4H  . atenolol  50 mg Oral Daily  . heparin subcutaneous  5,000 Units Subcutaneous Q8H  . insulin aspart  0-9 Units Subcutaneous TID WC  . isosorbide mononitrate  30 mg Oral QPM  . methocarbamol  500 mg Oral QID  . predniSONE  40 mg Oral Q breakfast  . sodium chloride  3 mL Intravenous Q12H  . tamsulosin  0.8 mg Oral Daily   ROS: as per hpi  Blood pressure 140/55, pulse 77, temperature 98.7 F (37.1 C), temperature source Oral, resp. rate 18, height 5\' 8"  (1.727 m), weight 74.5 kg (164 lb 3.9 oz), SpO2 100.00%.  General appearance: alert and cooperative Head: Normocephalic, without obvious abnormality, atraumatic Eyes: negative Nose: no discharge Resp: clear to auscultation bilaterally Chest  wall: no tenderness Cardio: regular rate and rhythm, S1, S2 normal, no murmur, click, rub or gallop GI: soft, non-tender; bowel sounds normal; no masses,  no organomegaly and slight fullness lower abdomen Extremities: edema left  BKA, right foot with 1+ edema and erythema of heel with softening Skin: Skin color, texture, turgor normal. No rashes or lesions Neurologic: nonfocal, decreased sena=sation of foot  Results for orders placed during the hospital encounter of 12/08/12 (from the past 48 hour(s))  GLUCOSE, CAPILLARY     Status: Abnormal   Collection Time    12/08/12  5:21 PM      Result Value Range   Glucose-Capillary 139 (*) 70 - 99 mg/dL   Comment 1 Documented in Chart     Comment 2 Notify RN    GLUCOSE, CAPILLARY     Status: Abnormal   Collection Time    12/08/12  9:18 PM      Result Value Range   Glucose-Capillary 162 (*) 70 - 99 mg/dL   Comment 1 Documented in Chart     Comment 2 Notify RN    CBC     Status: Abnormal   Collection Time    12/09/12  5:35 AM      Result Value Range   WBC 27.9 (*) 4.0 - 10.5 K/uL   RBC 6.14 (*) 4.22 - 5.81 MIL/uL   Hemoglobin 13.8  13.0 - 17.0 g/dL   HCT 16.1  09.6 - 04.5 %   MCV 77.7 (*) 78.0 - 100.0 fL   MCH 22.5 (*) 26.0 - 34.0 pg   MCHC 28.9 (*) 30.0 - 36.0 g/dL   RDW 40.9 (*) 81.1 - 91.4 %   Platelets 208  150 - 400 K/uL  BASIC METABOLIC PANEL     Status: Abnormal   Collection Time    12/09/12  5:35 AM      Result Value Range   Sodium 136  135 - 145 mEq/L   Potassium 4.6  3.5 - 5.1 mEq/L   Chloride 97  96 - 112 mEq/L   CO2 23  19 - 32 mEq/L   Glucose, Bld 121 (*) 70 - 99 mg/dL   BUN 42 (*) 6 - 23 mg/dL   Creatinine, Ser 7.82 (*) 0.50 - 1.35 mg/dL   Calcium 8.8  8.4 - 95.6 mg/dL   GFR calc non Af Amer 34 (*) >90 mL/min   GFR calc Af Amer 39 (*) >90 mL/min   Comment: (NOTE)     The eGFR has been calculated using the CKD EPI equation.     This calculation has not been validated in all clinical situations.     eGFR's  persistently <90 mL/min signify possible Chronic Kidney     Disease.  GLUCOSE, CAPILLARY     Status: Abnormal   Collection Time    12/09/12  8:02 AM      Result Value Range   Glucose-Capillary 143 (*) 70 - 99 mg/dL  GLUCOSE, CAPILLARY     Status: Abnormal   Collection Time    12/09/12 12:04 PM      Result Value Range   Glucose-Capillary 161 (*) 70 - 99 mg/dL  GLUCOSE, CAPILLARY     Status: Abnormal   Collection Time    12/09/12  5:05 PM      Result Value Range   Glucose-Capillary 158 (*) 70 - 99 mg/dL   Comment 1 Notify RN     Comment 2 Documented in Chart    GLUCOSE, CAPILLARY     Status: Abnormal   Collection Time    12/09/12  9:20 PM      Result Value Range   Glucose-Capillary 134 (*) 70 - 99 mg/dL  Comment 1 Documented in Chart     Comment 2 Notify RN    BASIC METABOLIC PANEL     Status: Abnormal   Collection Time    12/10/12  5:05 AM      Result Value Range   Sodium 136  135 - 145 mEq/L   Potassium 4.2  3.5 - 5.1 mEq/L   Chloride 101  96 - 112 mEq/L   CO2 23  19 - 32 mEq/L   Glucose, Bld 120 (*) 70 - 99 mg/dL   BUN 47 (*) 6 - 23 mg/dL   Creatinine, Ser 5.78 (*) 0.50 - 1.35 mg/dL   Calcium 8.2 (*) 8.4 - 10.5 mg/dL   GFR calc non Af Amer 31 (*) >90 mL/min   GFR calc Af Amer 36 (*) >90 mL/min   Comment: (NOTE)     The eGFR has been calculated using the CKD EPI equation.     This calculation has not been validated in all clinical situations.     eGFR's persistently <90 mL/min signify possible Chronic Kidney     Disease.  GLUCOSE, CAPILLARY     Status: Abnormal   Collection Time    12/10/12  7:16 AM      Result Value Range   Glucose-Capillary 110 (*) 70 - 99 mg/dL   Comment 1 Notify RN     Comment 2 Documented in Chart     No results found.  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

## 2012-12-11 DIAGNOSIS — Q891 Congenital malformations of adrenal gland: Secondary | ICD-10-CM

## 2012-12-11 DIAGNOSIS — N183 Chronic kidney disease, stage 3 unspecified: Secondary | ICD-10-CM

## 2012-12-11 LAB — GLUCOSE, CAPILLARY

## 2012-12-11 LAB — BASIC METABOLIC PANEL
Calcium: 8.1 mg/dL — ABNORMAL LOW (ref 8.4–10.5)
Creatinine, Ser: 1.82 mg/dL — ABNORMAL HIGH (ref 0.50–1.35)
GFR calc Af Amer: 40 mL/min — ABNORMAL LOW (ref >90)

## 2012-12-11 MED ORDER — BISACODYL 5 MG PO TBEC: 5.0000 mg | DELAYED_RELEASE_TABLET | Freq: Every day | ORAL | Status: DC

## 2012-12-11 MED ORDER — PREDNISONE 20 MG PO TABS: 40.0000 mg | ORAL_TABLET | Freq: Every day | ORAL | Status: AC

## 2012-12-11 MED ORDER — TORSEMIDE 20 MG PO TABS: 20.0000 mg | ORAL_TABLET | Freq: Every day | ORAL | Status: DC

## 2012-12-11 MED ORDER — HYDROCODONE-ACETAMINOPHEN 10-325 MG PO TABS
1.0000 | ORAL_TABLET | ORAL | Status: DC | PRN
  Administered 2012-12-11: 1 via ORAL
  Filled 2012-12-11: qty 1

## 2012-12-11 NOTE — Progress Notes (Signed)
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   We will sign off.  SPEP is pending at this time.  Subjective: Interval History: He reports he has seen Dr. Arrie Aran in the past but no record when I called officeyesterday.  Objective:  Vital signs in last 24 hours: Temp:  [97.5 F (36.4 C)-98.8 F (37.1 C)] 98.1 F (36.7 C) (08/30 0934) Pulse Rate:  [53-84] 62 (08/30 0934) Resp:  [18-20] 18 (08/30 0934) BP: (128-146)/(63-86) 132/86 mmHg (08/30 0934) SpO2:  [96 %-100 %] 100 % (08/30 0934) Weight:  [75.8 kg (167 lb 1.7 oz)] 75.8 kg (167 lb 1.7 oz) (08/29 2135) Weight change: 1.3 kg (2 lb 13.9 oz)  Intake/Output from previous day: 08/29 0701 - 08/30 0700 In: 1560 [P.O.:360; I.V.:1200] Out: 1159 [Urine:1156; Stool:3] Intake/Output this shift: Total I/O In: 240 [P.O.:240] Out: 100 [Urine:100]  General appearance: alert and cooperative Chest wall: no tenderness Cardio: S3 present Extremities: rle with chronic changes and edema, soft heel  Lab Results:  Recent Labs  12/09/12 0535  WBC 27.9*  HGB 13.8  HCT 47.7  PLT 208   BMET:  Recent Labs  12/10/12 0505 12/11/12 0628  NA 136 137  K 4.2 3.6  CL 101 102  CO2 23 22  GLUCOSE 120* 102*  BUN 47* 43*  CREATININE 1.99* 1.82*  CALCIUM 8.2* 8.1*   No results found for this basename: PTH,  in the last 72 hours Iron Studies: No results found for this basename: IRON, TIBC, TRANSFERRIN, FERRITIN,  in the last 72 hours Studies/Results: No results found.  Scheduled: . allopurinol  100 mg Oral Daily  . ALPRAZolam  0.25 mg Oral Q4H  . atenolol  50 mg Oral Daily  . [START ON 12/12/2012] bisacodyl  5 mg Oral Daily  . heparin subcutaneous  5,000 Units Subcutaneous Q8H  . insulin aspart  0-9 Units Subcutaneous TID WC  . isosorbide mononitrate  30 mg Oral QPM  . methocarbamol  500 mg Oral QID  .  predniSONE  40 mg Oral Q breakfast  . sodium chloride  3 mL Intravenous Q12H  . tamsulosin  0.8 mg Oral Daily     LOS: 3 days   Hilman Kissling C 12/11/2012,10:06 AM

## 2012-12-11 NOTE — Discharge Summary (Signed)
Physician Discharge Summary  Donald Dudley ZOX:[id number] DOB: [birth year] DOA: 12/08/2012  PCP: Lorenda Peck, MD  Admit date: 12/08/2012 Discharge date: 12/11/2012  Time spent: 65 minutes  Recommendations for Outpatient Follow-up:  1. Follow up with ROBERTS, Vernie Ammons, MDIn 1 week. On follow up patient will likely need a basic metabolic profile to follow up on his electrolytes and renal function. Patient's right he gallop flare would need to be reassessed. It was noted a renal ultrasound that patient had an adrenal gland lesion. Patient was given information to follow up with Dr. Gerrit Friends of [facility] surgery for further evaluation as outpatient however patient was hesitant and resistant to that idea and preferred to follow up with PCP as outpatient. PCP will need to follow up on this adrenal gland lesion noted on ultrasound. 2. Patient is to follow up with Dr. Lowell Guitar of [facility] 4 weeks post discharge for follow up of his probable chronic kidney disease stage III.  Discharge Diagnoses:  Principal Problem:   Gout flare Active Problems:   A-fib   HTN (hypertension)   DM2 (diabetes mellitus, type 2)   CKD (chronic kidney disease) stage 3, GFR 30-59 ml/min   Cellulitis of right foot   Gout   ARF (acute renal failure)   Bradycardia   Adrenal gland anomaly   Discharge Condition: stable and improved  Diet recommendation: heart healthy  Filed Weights   12/08/12 2005 12/09/12 2036 12/10/12 2135  Weight: 74.3 kg (163 lb 12.8 oz) 74.5 kg (164 lb 3.9 oz) 75.8 kg (167 lb 1.7 oz)    History of present illness:  Donald Dudley is a 72 y.o. male who presents to the ED with onset of severe pain in his R heel. Symptoms onset at around 10 PM last night, he states pain is associated with erythema and typical of his gout attacks he has had in the past including gout attacks in his heel. 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 His WBC in the ED is 15k but this appears to be a chronic elevation.  Given this history, EDP is (quite understandably) reluctant to say for certain that this erythema and tenderness of his heel is gout and not cellulitis, and as a result requests admission for empiric treatment of both.   Hospital Course:  #1 Acute gout flare  Patient was admitted with complaints of right heel pain with some erythema as well. On admission the initial concerns were for probable cellulitis versus acute gouty flare. Patient was started empirically on IV vancomycin and steroids. Patient improved clinically it was felt this was likely an acute gout flare and a such IV vancomycin was discontinued. Patient was subsequently maintained on oral prednisone with clinical improvement during the hospitalization. Patient will be discharged home on 2 more days of oral prednisone to complete a course of therapy. Patient will follow up with PCP as outpatient. #2 probable chronic kidney disease stage III On admission patient was noted to have an elevated creatinine of 2.03.initially felt to be acute renal failure as per the Epic patient's last creatinine from 04/13/2012 was 1.22. Patient denies any history of chronic kidney disease. Renal ultrasound was negative for hydronephrosis but a concern  of possible adrenal mass. Patient was initially hydrated with IV fluids however creatinine went down to 1.87 on 8/28 and then back up to 1.99 on 8/29. A nephrology consultation was subsequently obtained and bladder scan was ordered which was negative. Urinalysis had 30 mg of protein. Repeat pre-and post PCR was 27 cc and then 0 cc after patient voided 131 cc. 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 An SPEP was ordered which was pending at the time of discharge and need to be followed up as outpatient. On day of discharge patient's creatinine was 1.82. Patient will follow up with Dr. Lowell Guitar of nephrology as outpatient in 4 weeks for further evaluation and management. #3 abnormal abdominal ultrasound/? Adrenal gland anomaly Initially MRI of the abdomen was ordered for further evaluation of patient's adrenal gland are abnormality that was incidentally seen on renal ultrasound. MRI of the abdomen was deferred secondary to patient's ARF risk of nephrogenic fibrosing dermopathy. Patient will need to follow up with Dr Georgana Curio of [facility] as outpatient for further evaluation. This was discussed with the patient however patient was resistant hesitant to followup with Gen. Surgery as outpatient and opted to follow up with his PCP for now.  #4 bradycardia  During the hospitalization patient was noted to have bouts of bradycardia in the 40s while sleeping and improved to the 50s to 60s on awakening and movement. Patient remained asymptomatic. EKG done was A. Fib with a rate of 55. Patient stated has had some bradycardia at home with heart rates dropping to 30s when he sleeping. Patient's atenolol was held for one day his heart rate improved to was resumed and on day of discharge patient's heart rate was 71. Patient will follow up with PCP as outpatient. #5 atrial fibrillation  Patient's heart rate remained controlled throughout the hospitalization on atenolol. Patient was maintained on aspirin. #6 diabetes mellitus  Hemoglobin A1c was 5.6 in 04/10/2012. Maintained on sliding scale insulin.  #7 coronary artery disease/history of CABG/peripheral vascular disease  Asymptomatic and stable throughout the hospitalization. Patient was maintained on his home regimen.     Procedures: Renal US 12/08/12   Consultations:  RENAL:  Dr. Lowell Guitar 12/10/12  Discharge Exam: Filed Vitals:   12/11/12 1345  BP: 124/60  Pulse: 71  Temp: 98.4 F (36.9 C)  Resp: 19    General: NAD Cardiovascular: RRR Respiratory: CTAB  Discharge Instructions      Discharge Orders   Future Appointments Provider Department Dept Phone   06/06/2013 2:45 PM Sherrie Mustache [facility] [phone]   06/06/2013 3:15 PM Rana Snare, NP  [facility] [phone]   Future Orders Complete By Expires   Diet - low sodium heart healthy  As directed    Discharge instructions  As directed    Comments:     Follow up with ROBERTS, Vernie Ammons, MD in 1 week. Follow up with Dr Lowell Guitar, [facility] in 4 weeks. Follow up with Dr Gerrit Friends, [facility] surgery in 2 weeks for further eval of adrenal lesion noted on renal US   Increase activity slowly  As directed        Medication List    STOP taking these medications       triamterene-hydrochlorothiazide 75-50 MG per tablet  Commonly known as:  MAXZIDE      TAKE  these medications       allopurinol 100 MG tablet  Commonly known as:  ZYLOPRIM  Take 100 mg by mouth daily.     ALPRAZolam 0.25 MG tablet  Commonly known as:  XANAX  Take 0.25 mg by mouth every 4 (four) hours.     atenolol 100 MG tablet  Commonly known as:  TENORMIN  Take 50 mg by mouth daily.     bisacodyl 5 MG EC tablet  Generic drug:  bisacodyl  Take 5 mg by mouth daily as needed. For constipation     FLOMAX 0.4 MG Caps capsule  Generic drug:  tamsulosin  Take 0.8 mg by mouth daily.     HYDROcodone-acetaminophen 10-325 MG per tablet  Commonly known as:  NORCO  Take 1 tablet by mouth every 4 (four) hours as needed for pain.     insulin NPH-regular (70-30) 100 UNIT/ML injection  Commonly known as:  NOVOLIN 70/30  Inject 20-58 Units into the skin 2 (two) times daily. Sliding scale     isosorbide mononitrate 30 MG 24 hr tablet  Commonly  known as:  IMDUR  Take 30 mg by mouth every evening.     methocarbamol 500 MG tablet  Commonly known as:  ROBAXIN  Take 500 mg by mouth 4 (four) times daily.     predniSONE 20 MG tablet  Commonly known as:  DELTASONE  Take 2 tablets (40 mg total) by mouth daily with breakfast. Take for 2 days then stop.     torsemide 20 MG tablet  Commonly known as:  DEMADEX  Take 1 tablet (20 mg total) by mouth daily. Resume in 3 days.  Start taking on:  12/15/2012       Allergies  Allergen Reactions  . Amiodarone Swelling  . Colchicine Swelling    diarrhea  . Crestor [Rosuvastatin Calcium] Swelling  . Lipitor [Atorvastatin Calcium] Swelling  . Lisinopril Other (See Comments)    Unknown reaction  . Lyrica [Pregabalin] Swelling  . Mevacor [Lovastatin] Swelling    GI-bleed   . Micardis [Telmisartan] Other (See Comments)    Unknown reaction  . Neurontin [Gabapentin] Swelling  . Norvasc [Amlodipine Besylate] Swelling  . Potassium Chloride Er Other (See Comments)    Unknown reaction  . Ranolazine Er Other (See Comments)    Unknown reaction  . Zocor [Simvastatin - High Dose] Swelling   Follow-up Information   Follow up with [facility]. (Home Health physical therapy and occupational therapy.)    Contact information:   [address] [place] [address] [phone]       Follow up with ROBERTS, Vernie Ammons, MD. Schedule an appointment as soon as possible for a visit in 1 week.   Specialty:  Internal Medicine   Contact information:   [address] [place] [address] [phone]       Follow up with Lauris Poag, MD. Schedule an appointment as soon as possible for a visit in 4 weeks.   Specialty:  Nephrology   Contact information:   [address] [facility] [place] [address] [phone]       Follow up with Velora Heckler, MD. Schedule an appointment as soon as possible for a visit in 2 weeks. (to f/u on adrenal  lesion)    Specialty:  General Surgery   Contact information:   [address] [place] [address] [phone]        The results of  significant diagnostics from this hospitalization (including imaging, microbiology, ancillary and laboratory) are listed below for reference.    Significant Diagnostic Studies: US Renal  12/08/2012   *RADIOLOGY REPORT*  Clinical Data: Acute renal failure.  RENAL/URINARY TRACT ULTRASOUND COMPLETE  Comparison:  Chest x-ray 08/11/2010.  Findings:  Right Kidney:  No hydronephrosis.  Well-preserved cortex.  Normal size and parenchymal echotexture without focal abnormalities.  11.0 cm in length.  Left Kidney:  No hydronephrosis.  Well-preserved cortex.  Normal size and parenchymal echotexture. A 0.8 x 0.8 x 0.6 cm hypoechoic lesion extending exophytically from the junction of the upper and interpolar region of the left kidney laterally is indeterminate. 11.9 cm in length.  Bladder:  Urinary bladder is well distended without focal neural abnormalities.  Above the right kidney there is a well-defined heterogeneously hypoechoic/isoechoic lesion which appears to have some internal blood flow measuring 5.2 x 2.6 x 4.9 cm, presumably and an adrenal lesion.  Notably, comparison with CT of 09/16/2006 demonstrates no such adrenal lesion in this region on the examination.  IMPRESSION: 1.  0.8 x 0.8 x 0.6 cm lesion in the right kidney is too small to definitively characterize, but is favored to represent a small cyst. 2.  No hydronephrosis or other acute findings. 3.  5.2 x 2.6 x 4.9 cm solid lesion above the right kidney, presumably an adrenal mass.  This appears to be new compared to prior study 09/16/2006, and warrants further characterization with MRI of the abdomen to exclude neoplasm.  These results will be called to the ordering clinician or representative by the Radiologist Assistant, and communication documented in the PACS Dashboard.   Original Report  Authenticated By: Trudie Reed, M.D.    Microbiology: Recent Results (from the past 240 hour(s))  URINE CULTURE     Status: None   Collection Time    12/08/12  9:05 AM      Result Value Range Status   Specimen Description URINE, RANDOM   Final   Special Requests NONE   Final   Culture  Setup Time     Final   Value: 12/08/2012 23:49     Performed at [facility] Count     Final   Value: 20,OOO COLONIES/ML     Performed at [facility]   Culture     Final   Value: Multiple bacterial morphotypes present, none predominant. Suggest appropriate recollection if clinically indicated.     Performed at [facility]   Report Status 12/09/2012 FINAL   Final  MRSA PCR SCREENING     Status: None   Collection Time    12/08/12  9:05 AM      Result Value Range Status   MRSA by PCR NEGATIVE  NEGATIVE Final   Comment:            The GeneXpert MRSA Assay (FDA     approved for NASAL specimens     only), is one component of a     comprehensive MRSA colonization     surveillance program. It is not     intended to diagnose MRSA     infection nor to guide or     monitor treatment for     MRSA infections.     Labs: Basic Metabolic Panel:  Recent Labs Lab 12/08/12 0258 12/09/12 0535 12/10/12 0505 12/11/12 0628  NA 133* 136 136 137  K 4.2 4.6 4.2 3.6  CL 97 97 101 102  CO2 26  23 23 22   GLUCOSE 124* 121* 120* 102*  BUN 41* 42* 47* 43*  CREATININE 2.03* 1.87* 1.99* 1.82*  CALCIUM 9.3 8.8 8.2* 8.1*   Liver Function Tests: No results found for this basename: AST, ALT, ALKPHOS, BILITOT, PROT, ALBUMIN,  in the last 168 hours No results found for this basename: LIPASE, AMYLASE,  in the last 168 hours No results found for this basename: AMMONIA,  in the last 168 hours CBC:  Recent Labs Lab 12/06/12 1414 12/08/12 0258 12/09/12 0535  WBC 16.9* 15.3* 27.9*  NEUTROABS 14.5* 13.4*  --   HGB 13.6 13.1 13.8  HCT 46.7 46.1 47.7  MCV 75.2* 78.7 77.7*   PLT 214 203 208   Cardiac Enzymes: No results found for this basename: CKTOTAL, CKMB, CKMBINDEX, TROPONINI,  in the last 168 hours BNP: BNP (last 3 results) No results found for this basename: PROBNP,  in the last 8760 hours CBG:  Recent Labs Lab 12/10/12 1150 12/10/12 1705 12/10/12 2140 12/11/12 0745 12/11/12 1147  GLUCAP 106* 210* 132* 93 99       Signed:  THOMPSON,DANIEL  [facility] 12/11/2012, 2:14 PM

## 2012-12-11 NOTE — Progress Notes (Signed)
   CARE MANAGEMENT NOTE 12/11/2012  Patient:  ANCHOR, DWAN   Account Number:  [id number]  Date Initiated:  12/11/2012  Documentation initiated by:  [facility]  Subjective/Objective Assessment:   adm w/Stage 3 CKD due to arterionephrosclerosis     Action/Plan:   Anticipated DC Date:  12/11/2012   Anticipated DC Plan:        DC Planning Services  CM consult      [facility] Choice  HOME HEALTH   Choice offered to / List presented to:  [name]        HH arranged  HH-2 PT  HH-3 OT      [facility] agency  [facility]   Status of service:  Completed, signed off Medicare Important Message given?   (If response is "NO", the following Medicare IM given date fields will be blank) Date Medicare IM given:   Date Additional Medicare IM given:    Discharge Disposition:  HOME W HOME HEALTH SERVICES  Per UR Regulation:    If discussed at Long Length of Stay Meetings, dates discussed:    Comments:  12/11/12 00:16 CM spoke w/pt and offered choice.  Pt chose [facility] as he states he had a good experience with [facility]. Referral faxed to [facility] for HHPT/OT. Pt states he has a wheelchair, walker, shower seat, and hospital bed and does not need any other equip.  Pt has his wheelchair in his room.  No other CM needs communicated.  Freddy Jaksch, BSN, CM [phone]

## 2012-12-15 LAB — PROTEIN ELECTROPHORESIS, SERUM
M-Spike, %: NOT DETECTED g/dL
Total Protein ELP: 4.7 g/dL — ABNORMAL LOW (ref 6.0–8.3)

## 2013-03-24 ENCOUNTER — Emergency Department (HOSPITAL_COMMUNITY): Payer: Medicare Other

## 2013-03-24 ENCOUNTER — Inpatient Hospital Stay (HOSPITAL_COMMUNITY): Payer: Medicare Other

## 2013-03-24 ENCOUNTER — Encounter (HOSPITAL_COMMUNITY): Payer: Self-pay | Admitting: Emergency Medicine

## 2013-03-24 ENCOUNTER — Inpatient Hospital Stay (HOSPITAL_COMMUNITY)
Admission: EM | Admit: 2013-03-24 | Discharge: 2013-04-02 | DRG: 436 | Disposition: A | Discharge status: Hospice | Payer: Medicare Other | Attending: Internal Medicine | Admitting: Internal Medicine

## 2013-03-24 DIAGNOSIS — M549 Dorsalgia, unspecified: Secondary | ICD-10-CM

## 2013-03-24 DIAGNOSIS — I2510 Atherosclerotic heart disease of native coronary artery without angina pectoris: Secondary | ICD-10-CM

## 2013-03-24 DIAGNOSIS — G4700 Insomnia, unspecified: Secondary | ICD-10-CM | POA: Diagnosis present

## 2013-03-24 DIAGNOSIS — Z66 Do not resuscitate: Secondary | ICD-10-CM | POA: Diagnosis not present

## 2013-03-24 DIAGNOSIS — Z951 Presence of aortocoronary bypass graft: Secondary | ICD-10-CM

## 2013-03-24 DIAGNOSIS — R634 Abnormal weight loss: Secondary | ICD-10-CM | POA: Diagnosis present

## 2013-03-24 DIAGNOSIS — I509 Heart failure, unspecified: Secondary | ICD-10-CM | POA: Diagnosis present

## 2013-03-24 DIAGNOSIS — Z515 Encounter for palliative care: Secondary | ICD-10-CM

## 2013-03-24 DIAGNOSIS — I252 Old myocardial infarction: Secondary | ICD-10-CM

## 2013-03-24 DIAGNOSIS — G609 Hereditary and idiopathic neuropathy, unspecified: Secondary | ICD-10-CM | POA: Diagnosis present

## 2013-03-24 DIAGNOSIS — D72829 Elevated white blood cell count, unspecified: Secondary | ICD-10-CM

## 2013-03-24 DIAGNOSIS — N179 Acute kidney failure, unspecified: Secondary | ICD-10-CM

## 2013-03-24 DIAGNOSIS — D45 Polycythemia vera: Secondary | ICD-10-CM | POA: Diagnosis present

## 2013-03-24 DIAGNOSIS — E278 Other specified disorders of adrenal gland: Secondary | ICD-10-CM

## 2013-03-24 DIAGNOSIS — Z794 Long term (current) use of insulin: Secondary | ICD-10-CM

## 2013-03-24 DIAGNOSIS — I5022 Chronic systolic (congestive) heart failure: Secondary | ICD-10-CM | POA: Diagnosis present

## 2013-03-24 DIAGNOSIS — R109 Unspecified abdominal pain: Secondary | ICD-10-CM

## 2013-03-24 DIAGNOSIS — K5900 Constipation, unspecified: Secondary | ICD-10-CM | POA: Diagnosis not present

## 2013-03-24 DIAGNOSIS — T502X5A Adverse effect of carbonic-anhydrase inhibitors, benzothiadiazides and other diuretics, initial encounter: Secondary | ICD-10-CM | POA: Diagnosis not present

## 2013-03-24 DIAGNOSIS — E7800 Pure hypercholesterolemia, unspecified: Secondary | ICD-10-CM | POA: Diagnosis present

## 2013-03-24 DIAGNOSIS — Z96659 Presence of unspecified artificial knee joint: Secondary | ICD-10-CM

## 2013-03-24 DIAGNOSIS — I4891 Unspecified atrial fibrillation: Secondary | ICD-10-CM | POA: Diagnosis present

## 2013-03-24 DIAGNOSIS — S88119A Complete traumatic amputation at level between knee and ankle, unspecified lower leg, initial encounter: Secondary | ICD-10-CM

## 2013-03-24 DIAGNOSIS — M79609 Pain in unspecified limb: Secondary | ICD-10-CM | POA: Diagnosis present

## 2013-03-24 DIAGNOSIS — N183 Chronic kidney disease, stage 3 unspecified: Secondary | ICD-10-CM | POA: Diagnosis present

## 2013-03-24 DIAGNOSIS — C228 Malignant neoplasm of liver, primary, unspecified as to type: Principal | ICD-10-CM | POA: Diagnosis present

## 2013-03-24 DIAGNOSIS — F411 Generalized anxiety disorder: Secondary | ICD-10-CM | POA: Diagnosis present

## 2013-03-24 DIAGNOSIS — E876 Hypokalemia: Secondary | ICD-10-CM | POA: Diagnosis not present

## 2013-03-24 DIAGNOSIS — L03115 Cellulitis of right lower limb: Secondary | ICD-10-CM

## 2013-03-24 DIAGNOSIS — R160 Hepatomegaly, not elsewhere classified: Secondary | ICD-10-CM

## 2013-03-24 DIAGNOSIS — D751 Secondary polycythemia: Secondary | ICD-10-CM

## 2013-03-24 DIAGNOSIS — E785 Hyperlipidemia, unspecified: Secondary | ICD-10-CM | POA: Diagnosis present

## 2013-03-24 DIAGNOSIS — Q891 Congenital malformations of adrenal gland: Secondary | ICD-10-CM

## 2013-03-24 DIAGNOSIS — C9110 Chronic lymphocytic leukemia of B-cell type not having achieved remission: Secondary | ICD-10-CM | POA: Diagnosis present

## 2013-03-24 DIAGNOSIS — R110 Nausea: Secondary | ICD-10-CM | POA: Diagnosis not present

## 2013-03-24 DIAGNOSIS — Z87891 Personal history of nicotine dependence: Secondary | ICD-10-CM

## 2013-03-24 DIAGNOSIS — E875 Hyperkalemia: Secondary | ICD-10-CM

## 2013-03-24 DIAGNOSIS — E871 Hypo-osmolality and hyponatremia: Secondary | ICD-10-CM | POA: Diagnosis present

## 2013-03-24 DIAGNOSIS — Z9861 Coronary angioplasty status: Secondary | ICD-10-CM

## 2013-03-24 DIAGNOSIS — I129 Hypertensive chronic kidney disease with stage 1 through stage 4 chronic kidney disease, or unspecified chronic kidney disease: Secondary | ICD-10-CM | POA: Diagnosis present

## 2013-03-24 DIAGNOSIS — E860 Dehydration: Secondary | ICD-10-CM

## 2013-03-24 DIAGNOSIS — I10 Essential (primary) hypertension: Secondary | ICD-10-CM

## 2013-03-24 DIAGNOSIS — L089 Local infection of the skin and subcutaneous tissue, unspecified: Secondary | ICD-10-CM

## 2013-03-24 DIAGNOSIS — Y929 Unspecified place or not applicable: Secondary | ICD-10-CM

## 2013-03-24 DIAGNOSIS — R001 Bradycardia, unspecified: Secondary | ICD-10-CM

## 2013-03-24 DIAGNOSIS — E119 Type 2 diabetes mellitus without complications: Secondary | ICD-10-CM | POA: Diagnosis present

## 2013-03-24 DIAGNOSIS — I739 Peripheral vascular disease, unspecified: Secondary | ICD-10-CM | POA: Diagnosis present

## 2013-03-24 DIAGNOSIS — M109 Gout, unspecified: Secondary | ICD-10-CM | POA: Diagnosis present

## 2013-03-24 DIAGNOSIS — C220 Liver cell carcinoma: Secondary | ICD-10-CM

## 2013-03-24 DIAGNOSIS — M129 Arthropathy, unspecified: Secondary | ICD-10-CM | POA: Diagnosis present

## 2013-03-24 DIAGNOSIS — N400 Enlarged prostate without lower urinary tract symptoms: Secondary | ICD-10-CM | POA: Diagnosis present

## 2013-03-24 DIAGNOSIS — E279 Disorder of adrenal gland, unspecified: Secondary | ICD-10-CM

## 2013-03-24 LAB — URINALYSIS, ROUTINE W REFLEX MICROSCOPIC
Hgb urine dipstick: NEGATIVE
Ketones, ur: 15 mg/dL — AB
Protein, ur: NEGATIVE mg/dL
Urobilinogen, UA: 1 mg/dL (ref 0.0–1.0)

## 2013-03-24 LAB — CBC WITH DIFFERENTIAL/PLATELET
Basophils Absolute: 0 10*3/uL (ref 0.0–0.1)
Basophils Absolute: 0.1 10*3/uL (ref 0.0–0.1)
Eosinophils Absolute: 0 10*3/uL (ref 0.0–0.7)
Eosinophils Absolute: 0.1 10*3/uL (ref 0.0–0.7)
Eosinophils Relative: 0 % (ref 0–5)
HCT: 65.2 % — ABNORMAL HIGH (ref 39.0–52.0)
Lymphocytes Relative: 4 % — ABNORMAL LOW (ref 12–46)
Lymphocytes Relative: 6 % — ABNORMAL LOW (ref 12–46)
MCHC: 27.3 g/dL — ABNORMAL LOW (ref 30.0–36.0)
MCHC: 29.5 g/dL — ABNORMAL LOW (ref 30.0–36.0)
MCV: 72.4 fL — ABNORMAL LOW (ref 78.0–100.0)
Neutro Abs: 46.4 10*3/uL — ABNORMAL HIGH (ref 1.7–7.7)
Neutrophils Relative %: 93 % — ABNORMAL HIGH (ref 43–77)
Platelets: 477 10*3/uL — ABNORMAL HIGH (ref 150–400)
RDW: 19 % — ABNORMAL HIGH (ref 11.5–15.5)
RDW: 19.7 % — ABNORMAL HIGH (ref 11.5–15.5)
WBC: 53.4 10*3/uL (ref 4.0–10.5)

## 2013-03-24 LAB — BASIC METABOLIC PANEL
BUN: 94 mg/dL — ABNORMAL HIGH (ref 6–23)
Calcium: 10 mg/dL (ref 8.4–10.5)
Creatinine, Ser: 2.96 mg/dL — ABNORMAL HIGH (ref 0.50–1.35)
GFR calc non Af Amer: 19 mL/min — ABNORMAL LOW (ref >90)
Glucose, Bld: 131 mg/dL — ABNORMAL HIGH (ref 70–99)
Sodium: 130 mEq/L — ABNORMAL LOW (ref 135–145)

## 2013-03-24 LAB — CREATININE, SERUM
GFR calc Af Amer: 23 mL/min — ABNORMAL LOW (ref >90)
GFR calc non Af Amer: 20 mL/min — ABNORMAL LOW (ref >90)

## 2013-03-24 LAB — CBC
HCT: 57 % — ABNORMAL HIGH (ref 39.0–52.0)
Hemoglobin: 16.5 g/dL (ref 13.0–17.0)
MCHC: 28.9 g/dL — ABNORMAL LOW (ref 30.0–36.0)
WBC: 49.9 10*3/uL — ABNORMAL HIGH (ref 4.0–10.5)

## 2013-03-24 LAB — CG4 I-STAT (LACTIC ACID): Lactic Acid, Venous: 6.41 mmol/L — ABNORMAL HIGH (ref 0.5–2.2)

## 2013-03-24 LAB — GLUCOSE, CAPILLARY: Glucose-Capillary: 124 mg/dL — ABNORMAL HIGH (ref 70–99)

## 2013-03-24 MED ORDER — SODIUM CHLORIDE 0.9 % IV SOLN: INTRAVENOUS | Status: AC

## 2013-03-24 MED ORDER — ALLOPURINOL 100 MG PO TABS
100.0000 mg | ORAL_TABLET | Freq: Every day | ORAL | Status: DC
  Administered 2013-03-25 – 2013-04-02 (×9): 100 mg via ORAL
  Filled 2013-03-24 (×9): qty 1

## 2013-03-24 MED ORDER — SODIUM CHLORIDE 0.9 % IV SOLN
INTRAVENOUS | Status: DC
  Administered 2013-03-24: 100 mL/h via INTRAVENOUS

## 2013-03-24 MED ORDER — ONDANSETRON HCL 4 MG PO TABS: 4.0000 mg | ORAL_TABLET | Freq: Four times a day (QID) | ORAL | Status: DC | PRN

## 2013-03-24 MED ORDER — ALPRAZOLAM 0.25 MG PO TABS
0.2500 mg | ORAL_TABLET | ORAL | Status: DC | PRN
  Administered 2013-03-25 – 2013-04-02 (×3): 0.25 mg via ORAL
  Filled 2013-03-24 (×3): qty 1

## 2013-03-24 MED ORDER — IOHEXOL 300 MG/ML  SOLN
25.0000 mL | INTRAMUSCULAR | Status: AC
  Administered 2013-03-24 (×2): 25 mL via ORAL

## 2013-03-24 MED ORDER — INSULIN ASPART 100 UNIT/ML ~~LOC~~ SOLN
0.0000 [IU] | SUBCUTANEOUS | Status: DC
  Administered 2013-03-24: 2 [IU] via SUBCUTANEOUS

## 2013-03-24 MED ORDER — HYDROMORPHONE HCL PF 1 MG/ML IJ SOLN
0.5000 mg | INTRAMUSCULAR | Status: AC | PRN
  Administered 2013-03-24 – 2013-03-25 (×4): 0.5 mg via INTRAVENOUS
  Filled 2013-03-24 (×3): qty 1

## 2013-03-24 MED ORDER — ISOSORBIDE MONONITRATE ER 30 MG PO TB24
30.0000 mg | ORAL_TABLET | Freq: Every evening | ORAL | Status: DC
  Administered 2013-03-24: 30 mg via ORAL
  Filled 2013-03-24 (×2): qty 1

## 2013-03-24 MED ORDER — ONDANSETRON HCL 4 MG/2ML IJ SOLN: 4.0000 mg | Freq: Three times a day (TID) | INTRAMUSCULAR | Status: DC | PRN

## 2013-03-24 MED ORDER — HYDROMORPHONE HCL PF 1 MG/ML IJ SOLN
0.5000 mg | Freq: Once | INTRAMUSCULAR | Status: AC
  Administered 2013-03-24: 0.5 mg via INTRAVENOUS
  Filled 2013-03-24: qty 1

## 2013-03-24 MED ORDER — INSULIN DETEMIR 100 UNIT/ML ~~LOC~~ SOLN
45.0000 [IU] | Freq: Every day | SUBCUTANEOUS | Status: DC
  Administered 2013-03-28: 45 [IU] via SUBCUTANEOUS
  Filled 2013-03-24 (×6): qty 0.45

## 2013-03-24 MED ORDER — MORPHINE SULFATE 4 MG/ML IJ SOLN
4.0000 mg | INTRAMUSCULAR | Status: DC | PRN
  Administered 2013-03-25: 4 mg via INTRAVENOUS
  Filled 2013-03-24: qty 1

## 2013-03-24 MED ORDER — HYDROMORPHONE HCL PF 1 MG/ML IJ SOLN
1.0000 mg | Freq: Once | INTRAMUSCULAR | Status: AC
  Administered 2013-03-24: 1 mg via INTRAVENOUS
  Filled 2013-03-24: qty 1

## 2013-03-24 MED ORDER — ONDANSETRON HCL 4 MG/2ML IJ SOLN
4.0000 mg | Freq: Once | INTRAMUSCULAR | Status: AC
  Administered 2013-03-24: 4 mg via INTRAVENOUS
  Filled 2013-03-24: qty 2

## 2013-03-24 MED ORDER — SODIUM CHLORIDE 0.9 % IV BOLUS (SEPSIS)
500.0000 mL | Freq: Once | INTRAVENOUS | Status: AC
  Administered 2013-03-24: 500 mL via INTRAVENOUS

## 2013-03-24 MED ORDER — BISACODYL 5 MG PO TBEC: 5.0000 mg | DELAYED_RELEASE_TABLET | Freq: Every day | ORAL | Status: DC | PRN

## 2013-03-24 MED ORDER — HYDROCODONE-ACETAMINOPHEN 10-325 MG PO TABS
2.0000 | ORAL_TABLET | ORAL | Status: DC | PRN
  Administered 2013-03-25 (×2): 2 via ORAL
  Filled 2013-03-24 (×2): qty 2

## 2013-03-24 MED ORDER — POLYETHYLENE GLYCOL 3350 17 G PO PACK
17.0000 g | PACK | Freq: Every day | ORAL | Status: DC | PRN
  Filled 2013-03-24: qty 1

## 2013-03-24 MED ORDER — ONDANSETRON HCL 4 MG/2ML IJ SOLN
4.0000 mg | Freq: Four times a day (QID) | INTRAMUSCULAR | Status: DC | PRN
  Administered 2013-03-25 – 2013-04-02 (×9): 4 mg via INTRAVENOUS
  Filled 2013-03-24 (×10): qty 2

## 2013-03-24 MED ORDER — ACETAMINOPHEN 500 MG PO TABS
1000.0000 mg | ORAL_TABLET | Freq: Once | ORAL | Status: DC
  Filled 2013-03-24: qty 2

## 2013-03-24 MED ORDER — HEPARIN SODIUM (PORCINE) 5000 UNIT/ML IJ SOLN
5000.0000 [IU] | Freq: Three times a day (TID) | INTRAMUSCULAR | Status: DC
  Administered 2013-03-26 – 2013-04-02 (×9): 5000 [IU] via SUBCUTANEOUS
  Filled 2013-03-24 (×29): qty 1

## 2013-03-24 MED ORDER — ALPRAZOLAM 0.25 MG PO TABS: 0.2500 mg | ORAL_TABLET | ORAL | Status: DC

## 2013-03-24 MED ORDER — TAMSULOSIN HCL 0.4 MG PO CAPS
0.8000 mg | ORAL_CAPSULE | Freq: Every day | ORAL | Status: DC
  Administered 2013-03-25 – 2013-04-02 (×9): 0.8 mg via ORAL
  Filled 2013-03-24 (×9): qty 2

## 2013-03-24 MED ORDER — METOCLOPRAMIDE HCL 5 MG/ML IJ SOLN
10.0000 mg | Freq: Once | INTRAMUSCULAR | Status: AC
Start: 2013-03-24 — End: 2013-03-24
  Administered 2013-03-24: 10 mg via INTRAVENOUS
  Filled 2013-03-24: qty 2

## 2013-03-24 NOTE — ED Notes (Signed)
Pt brought via ems for increasing abdominal/R back pain x 1 month.  Pain was so intense today that pt called EMS.  Took 2 vicodin with no relief.  Denies diarrhea or constipation.  Last bm this am was normal.

## 2013-03-24 NOTE — ED Provider Notes (Signed)
CSN: [id number]     Arrival date & time 03/24/13  1017 History   First MD Initiated Contact with Patient 03/24/13 1029     Chief Complaint  Patient presents with  . Back Pain  . Abdominal Pain   (Consider location/radiation/quality/duration/timing/severity/associated sxs/prior Treatment) HPI Comments: 72 yo male with left BKA, DM, neuropathy, gout presents with right flank pain severe the past two days.  Pt has had similar intermittent the past 2 months however it has worsened acutely two days prior.  Constant but worse with movement.  No back surgery hx.  No b/b changes.  No weakness but numbness RLE similar to previous. No injuries.  Pt has poor renal fx so has not had contrast study.  MRI recently was cancelled unknown reasons.  Pain mostly back, less so abdomen.  No AAA hx.   Patient is a 72 y.o. male presenting with back pain and abdominal pain. The history is provided by the patient.  Back Pain Associated symptoms: abdominal pain and numbness   Associated symptoms: no chest pain, no dysuria, no fever and no headaches   Abdominal Pain Associated symptoms: nausea and vomiting   Associated symptoms: no chest pain, no chills, no dysuria, no fever and no shortness of breath     Past Medical History  Diagnosis Date  . S/P BKA (below knee amputation)   . History of cholecystectomy   . Hx of CABG   . PVD (peripheral vascular disease)   . Peripheral neuropathy   . Dyslipidemia   . Arthritis   . Hyperlipidemia   . Hypercholesterolemia   . Chest pain   . CAD (coronary artery disease)     CABG - 1993  /  PTCA - 2004  /  Cath - 2006  . Gout     takes Allopurinol daily  . Chronic kidney disease, stage III (moderate)   . CLL (chronic lymphocytic leukemia)   . CHF (congestive heart failure)   . Bilateral renal artery stenosis     status post stents  . Polycythemia   . Hypertension     takes Atenolol,Maxzide,and Isosorbide daily  . Atrial fibrillation     not on Coumadin  . MI  (myocardial infarction) 1991  . Bruises easily   . Constipation     related to pain meds-takes OTC stool softener daily  . Peripheral edema     takes Torsemide prn  . Urinary frequency   . Enlarged prostate     takes flomax daily  . Diabetes mellitus     takes Novolin 70/30;average fasting 100-120  . Anxiety     takes Xanax daily  . Insomnia     Ambien prn;but none if over a yr  . Anginal pain     Take isosorbide  . Shingles rash    Past Surgical History  Procedure Laterality Date  . Coronary angioplasty with stent placement  2004    second diagonal artery -- [name], M.D.   . Cardiac catheterization  2006    Est. EF of 65% -- Diffuse coronary artery disease.  The second diagonal artery that was angioplastied in 2004 is now occluded.  It is no longer a candidate for PTCA since it is now flush occluded.  Normal LV systolic function.  [name], M.D.  . Renal artery stent  2003    Bilateral renal artery stenosis  . Total knee arthroplasty      left  . Distal clavicle excision  2003  SURGEON:  [name], M.D.  . Tendon repair      Left Achilles repair x2  . Coronary artery bypass graft  1993    left internal mammary artery to his LAD artery --   . Coronary artery bypass graft  1994    vein graft failure of in his right coronary artery  . Colonoscopy    . Esophagogastroduodenoscopy    . I&d extremity  09/10/2011    Procedure: IRRIGATION AND DEBRIDEMENT EXTREMITY;  Surgeon: Sherren Kerns, MD;  Location: [facility] OR;  Service: Vascular;  Laterality: Left;  OF BKA  . Pr vein bypass graft,aorto-fem-pop    . Joint replacement  1998    Left knee  . Below knee leg amputation      left  . Cholecystectomy    . I&d extremity  04/09/2012    Procedure: IRRIGATION AND DEBRIDEMENT EXTREMITY;  Surgeon: Sharma Covert, MD;  Location: [facility] OR;  Service: Orthopedics;  Laterality: Bilateral;  right middle finger, left index finger   Family History  Problem Relation Age  of Onset  . Coronary artery disease Father   . Heart disease Father   . Hypertension Father   . Heart attack Father   . Coronary artery disease Mother   . Heart failure Mother   . Dementia Mother   . Heart disease Mother     Heart disease before age 45  . Hypertension Mother   . Heart attack Mother   . Diabetes      siblings  . Hypertension Daughter   . Anesthesia problems Neg Hx   . Hypotension Neg Hx   . Malignant hyperthermia Neg Hx   . Pseudochol deficiency Neg Hx   . Heart disease Brother   . Hypertension Brother    History  Substance Use Topics  . Smoking status: Former Smoker -- 25 years    Types: Cigarettes    Quit date: 04/14/1976  . Smokeless tobacco: Never Used  . Alcohol Use: No    Review of Systems  Constitutional: Negative for fever and chills.  HENT: Negative for congestion.   Eyes: Negative for visual disturbance.  Respiratory: Negative for shortness of breath.   Cardiovascular: Negative for chest pain.  Gastrointestinal: Positive for nausea, vomiting and abdominal pain.  Genitourinary: Positive for flank pain. Negative for dysuria.  Musculoskeletal: Positive for back pain. Negative for neck pain and neck stiffness.  Skin: Negative for rash.  Neurological: Positive for numbness. Negative for light-headedness and headaches.    Allergies  Amiodarone; Colchicine; Crestor; Lipitor; Lisinopril; Lyrica; Mevacor; Micardis; Neurontin; Norvasc; Potassium chloride er; Ranolazine er; and Zocor  Home Medications   Current Outpatient Rx  Name  Route  Sig  Dispense  Refill  . allopurinol (ZYLOPRIM) 100 MG tablet   Oral   Take 100 mg by mouth daily.         Marland Kitchen ALPRAZolam (XANAX) 0.25 MG tablet   Oral   Take 0.25 mg by mouth every 4 (four) hours.          Marland Kitchen atenolol (TENORMIN) 50 MG tablet   Oral   Take 50 mg by mouth daily.         . bisacodyl (BISACODYL) 5 MG EC tablet   Oral   Take 5 mg by mouth daily as needed. For constipation         .  HYDROcodone-acetaminophen (NORCO) 10-325 MG per tablet   Oral   Take 2 tablets by mouth every 4 (four)  hours as needed for pain.          Marland Kitchen insulin NPH-insulin regular (NOVOLIN 70/30) (70-30) 100 UNIT/ML injection   Subcutaneous   Inject 20-58 Units into the skin 2 (two) times daily. Sliding scale         . isosorbide mononitrate (IMDUR) 30 MG 24 hr tablet   Oral   Take 30 mg by mouth every evening.          . methocarbamol (ROBAXIN) 500 MG tablet   Oral   Take 500 mg by mouth every 6 (six) hours as needed for muscle spasms.          . Tamsulosin HCl (FLOMAX) 0.4 MG CAPS   Oral   Take 0.8 mg by mouth daily.          Marland Kitchen torsemide (DEMADEX) 20 MG tablet   Oral   Take 10 mg by mouth daily. Resume in 3 days.         Marland Kitchen triamterene-hydrochlorothiazide (MAXZIDE) 75-50 MG per tablet   Oral   Take 1 tablet by mouth daily.          BP 94/24  Pulse 70  Resp 18  SpO2 90% Physical Exam  Nursing note and vitals reviewed. Constitutional: He is oriented to person, place, and time. He appears well-developed and well-nourished.  HENT:  Head: Normocephalic and atraumatic.  Mild dry mm  Eyes: Conjunctivae are normal. Right eye exhibits no discharge. Left eye exhibits no discharge.  Neck: Normal range of motion. Neck supple. No tracheal deviation present.  Cardiovascular: Normal rate and regular rhythm.   Pulmonary/Chest: Effort normal and breath sounds normal.  Abdominal: Soft. He exhibits no distension. There is no tenderness. There is no guarding.  Musculoskeletal: He exhibits tenderness (right flank/ paraspinal lumbar). He exhibits no edema.  Neurological: He is alert and oriented to person, place, and time. GCS eye subscore is 4. GCS verbal subscore is 5. GCS motor subscore is 6.  Sensation intact RLE and right thigh however numb distal which is chronic 5+ strength f/e of major joints LE (below knee left) Pain in back with hip flexion and knee extension   Skin: Skin is  warm. No rash noted.  Psychiatric: He has a normal mood and affect.    ED Course  Procedures (including critical care time) EMERGENCY DEPARTMENT ULTRASOUND  Study: Limited Retroperitoneal Ultrasound of the Abdominal Aorta.  INDICATIONS:Abdominal pain, Back pain and Age>55 Multiple views of the abdominal aorta were obtained in real-time from the diaphragmatic hiatus to the aortic bifurcation in transverse planes with a multi-frequency probe. PERFORMED BY: Myself IMAGES ARCHIVED?: Yes FINDINGS: Maximum aortic dimensions are 1.8 cm LIMITATIONS:  Bowel gas INTERPRETATION:  No abdominal aortic aneurysm    Labs Review Labs Reviewed  CBC WITH DIFFERENTIAL - Abnormal; Notable for the following:    WBC 49.9 (*)    RBC 8.90 (*)    Hemoglobin 17.8 (*)    HCT 65.2 (*)    MCV 73.3 (*)    MCH 20.0 (*)    MCHC 27.3 (*)    RDW 19.0 (*)    Neutrophils Relative % 93 (*)    Lymphocytes Relative 4 (*)    Neutro Abs 46.4 (*)    Monocytes Absolute 1.5 (*)    All other components within normal limits  BASIC METABOLIC PANEL - Abnormal; Notable for the following:    Sodium 130 (*)    Potassium 5.5 (*)    Chloride 79 (*)  Glucose, Bld 131 (*)    BUN 94 (*)    Creatinine, Ser 2.96 (*)    GFR calc non Af Amer 19 (*)    GFR calc Af Amer 22 (*)    All other components within normal limits  URINALYSIS, ROUTINE W REFLEX MICROSCOPIC - Abnormal; Notable for the following:    Bilirubin Urine MODERATE (*)    Ketones, ur 15 (*)    All other components within normal limits  CBC WITH DIFFERENTIAL - Abnormal; Notable for the following:    WBC 53.4 (*)    RBC 7.86 (*)    HCT 56.9 (*)    MCV 72.4 (*)    MCH 21.4 (*)    MCHC 29.5 (*)    RDW 19.7 (*)    Platelets 477 (*)    Neutrophils Relative % 93 (*)    Neutro Abs 49.6 (*)    Lymphocytes Relative 6 (*)    Monocytes Relative 1 (*)    All other components within normal limits  LACTIC ACID, PLASMA - Abnormal; Notable for the following:     Lactic Acid, Venous 2.9 (*)    All other components within normal limits  CG4 I-STAT (LACTIC ACID) - Abnormal; Notable for the following:    Lactic Acid, Venous 6.41 (*)    All other components within normal limits   Imaging Review Ct Abdomen Pelvis Wo Contrast  03/24/2013   CLINICAL DATA:  Back pain  EXAM: CT ABDOMEN AND PELVIS WITHOUT CONTRAST  TECHNIQUE: Multidetector CT imaging of the abdomen and pelvis was performed following the standard protocol without intravenous contrast.  COMPARISON:  None.  FINDINGS: Sagittal images of the spine shows no destructive bony lesions. Mild disc space flattening with mild anterior spurring at L2-L3 level.  The study is markedly limited without IV contrast. Lung bases are unremarkable. There is heterogeneous low density lesion posterior aspect of the right hepatic lobe measures at least 6 cm. This is highly suspicious for primary or secondary malignancy. Further correlation with enhanced study is recommended. Extensive atherosclerotic calcifications abdominal aorta, bilateral proximal renal artery, SMA, splenic artery and iliac arteries are noted. No aortic aneurysm. Probable stent in right renal artery.  There is right adrenal lesion measures 6.7 by 3.7 cm. This is highly suspicious for primary or secondary malignancy. Further correlation with enhanced MRI is recommended.  The patient is status post cholecystectomy. A 2nd low-density lesion within right hepatic lobe anteriorly measures 2 cm. Unenhanced spleen is unremarkable. Mild pancreatic atrophy. The left adrenal gland is unremarkable.  Kidneys shows bilateral cortical thinning. No nephrolithiasis. No hydronephrosis or hydroureter. No small bowel obstruction. Mild distended urinary bladder. Prostate gland measures 3.7 x 4.2 cm. Atherosclerotic calcifications of femoral arteries. No calcified calculi are noted within urinary bladder. No ureteral calculi are identified.  IMPRESSION: 1. There is low density lesion  posterior aspect of right hepatic lobe measures 6.3 cm highly suspicious for primary or secondary malignancy. Further assessment with enhanced study is recommended. There is right adrenal lesion measures 6.7 x 3.7 cm. Primary or secondary malignancy is suspected. Further evaluation is recommended. 2. Extensive atherosclerotic vascular calcifications. 3. No hydronephrosis or hydroureter. 4. Mild distended urinary bladder. These results were called by telephone at the time of interpretation on 03/24/2013 at 2:34 PM to Dr. Blane Ohara , who verbally acknowledged these results. Mild enlarged prostate gland.   Electronically Signed   By: Natasha Mead M.D.   On: 03/24/2013 14:35    EKG Interpretation  Date/Time:  Thursday March 24 2013 10:29:07 EST Ventricular Rate:  81 PR Interval:    QRS Duration: 157 QT Interval:  445 QTC Calculation: 517 R Axis:   -174 Text Interpretation:  Atrial fibrillation Right bundle branch block Anterior infarct, old Borderline ST depression, lateral leads Baseline wander in lead(s) V3 No new findings  Confirmed by Kort Stettler  MD, Olukemi Panchal ([id number]) on 03/24/2013 10:51:08 AM            MDM   1. Mass of adrenal gland   2. Liver mass   3. Polycythemia   4. Acute right flank pain   5. Leukocytosis   6. Hyperkalemia   7. ARF (acute renal failure)   8. Dehydration    Concern for MSK vs kidney stone vs AAA vs ischemic bowel vs herniated disc vs other. Bedside US no AAA. No abd pain on exam. Pain meds and fluids given. Multiple pain meds required in ED.  Fluids given.   CT abd pelvis done. Multiple abnormalities discussed with radiology and patient. Admitted for further evaluation, spoke with [facility].  Marland Kitchen    Enid Skeens, MD 03/24/13 [phone]

## 2013-03-24 NOTE — ED Notes (Signed)
Dr Zavitz at bedside  

## 2013-03-24 NOTE — ED Notes (Signed)
PT notified by MD of ca to liver, etc.  Pt tearful and stated he would like to speak to chaplain.  Chaplain at bedside.  DR Jodi Mourning notified of pt pain increasing.  Pain  8/10.

## 2013-03-24 NOTE — H&P (Signed)
[facility] History and Physical  Donald Dudley ZOX:[id number] DOB: 1941/04/08 DOA: 03/24/2013  Referring physician: EDP PCP: Lorenda Peck, MD   Chief Complaint: Right side abdominal pain  HPI: Donald Dudley is a 72 y.o. 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 He relates his narcotics have been helping with the pain. Recently the pain medication has lost its effect. He relates he's had intermittent episodes like this 2 months prior to admission but didn't pay much attention. He relates some night sweats and unintentional 30 pound weight loss in about 2 months. He relates he can't lay flat on his back as the pain is unbearable. It feels better when he is on his left side.  In the ED: A basic metabolic panel was done that showed mild hyperkalemia, hyponatremia and hypochloremia with elevation of his creatinine at 2.9 (baseline creatinine 1.8), with a white blood cell count of 49 (previous CBCs showed a white count between 25-40), a hemoglobin of 17 and a platelet count of 394. A CT scan of the abdomen and pelvis was done with results as below.  Review of Systems:  Constitutional:   Fevers, chills, fatigue.  HEENT:  No headaches, Difficulty swallowing,Tooth/dental problems,Sore throat,  No sneezing, itching, ear ache, nasal congestion, post nasal drip,  Cardio-vascular:  No chest pain, Orthopnea, PND, swelling in lower extremities, anasarca, dizziness, palpitations  GI:  No heartburn, indigestion, abdominal pain, nausea, vomiting, diarrhea, change in bowel habits, loss of appetite  Resp:  No shortness of breath with exertion or at rest. No excess mucus, no productive cough, No non-productive cough, No coughing up of blood.No  change in color of mucus.No wheezing.No chest wall deformity  Skin:  no rash or lesions.  GU:  no dysuria, change in color of urine, no urgency or frequency. No flank pain.  Musculoskeletal:  No joint pain or swelling. No decreased range of motion. No back pain.  Psych:  No change in mood or affect. No depression or anxiety. No memory loss.   Past Medical History  Diagnosis Date  . S/P BKA (below knee amputation)   . History of cholecystectomy   . Hx of CABG   . PVD (peripheral vascular disease)   . Peripheral neuropathy   . Dyslipidemia   . Hyperlipidemia   . Hypercholesterolemia   . Chest pain   . CAD (coronary artery disease)     CABG - 1993  /  PTCA - 2004  /  Cath - 2006  . Gout     takes Allopurinol daily  . CHF (congestive heart failure)   . Polycythemia   . Atrial fibrillation     not on Coumadin  . MI (myocardial infarction) 1991  . Bruises easily   . Constipation     related to pain meds-takes OTC stool softener daily  . Peripheral edema     takes Torsemide prn  . Urinary frequency   . Enlarged prostate     takes flomax daily  . Anxiety     takes Xanax daily  . Insomnia     Ambien prn;but none if over a yr  . Anginal pain     Take isosorbide  . Shingles rash   . CLL (chronic lymphocytic leukemia)   .  Arthritis   . Diabetes mellitus     takes Novolin 70/30;average fasting 100-120  . Hypertension     takes Atenolol,Maxzide,and Isosorbide daily  . Chronic kidney disease, stage III (moderate)   . Bilateral renal artery stenosis     status post stents   Past Surgical History  Procedure Laterality Date  . Coronary angioplasty with stent placement  2004    second diagonal artery -- [name], M.D.   . Cardiac catheterization  2006    Est. EF of 65% -- Diffuse coronary artery disease.  The second diagonal artery that was angioplastied in 2004 is now occluded.  It is no longer a candidate for PTCA since it is now flush occluded.  Normal LV systolic  function.  [name], M.D.  . Renal artery stent  2003    Bilateral renal artery stenosis  . Total knee arthroplasty      left  . Distal clavicle excision  2003    SURGEON:  [name], M.D.  . Tendon repair      Left Achilles repair x2  . Coronary artery bypass graft  1993    left internal mammary artery to his LAD artery --   . Coronary artery bypass graft  1994    vein graft failure of in his right coronary artery  . Colonoscopy    . Esophagogastroduodenoscopy    . I&d extremity  09/10/2011    Procedure: IRRIGATION AND DEBRIDEMENT EXTREMITY;  Surgeon: Sherren Kerns, MD;  Location: [facility] OR;  Service: Vascular;  Laterality: Left;  OF BKA  . Pr vein bypass graft,aorto-fem-pop    . Joint replacement  1998    Left knee  . Below knee leg amputation      left  . Cholecystectomy    . I&d extremity  04/09/2012    Procedure: IRRIGATION AND DEBRIDEMENT EXTREMITY;  Surgeon: Sharma Covert, MD;  Location: [facility] OR;  Service: Orthopedics;  Laterality: Bilateral;  right middle finger, left index finger   Social History:  reports that he quit smoking about 36 years ago. His smoking use included Cigarettes. He has a 75 pack-year smoking history. He has never used smokeless tobacco. He reports that he does not drink alcohol or use illicit drugs.  Allergies  Allergen Reactions  . Amiodarone Swelling  . Colchicine Swelling    diarrhea  . Crestor [Rosuvastatin Calcium] Swelling  . Lipitor [Atorvastatin Calcium] Swelling  . Lisinopril Other (See Comments)    Unknown reaction  . Lyrica [Pregabalin] Swelling  . Mevacor [Lovastatin] Swelling    GI-bleed   . Micardis [Telmisartan] Other (See Comments)    Unknown reaction  . Neurontin [Gabapentin] Swelling  . Norvasc [Amlodipine Besylate] Swelling  . Potassium Chloride Er Other (See Comments)    Unknown reaction  . Ranolazine Er Other (See Comments)    Unknown reaction  . Zocor [Simvastatin - High Dose] Swelling    Family  History  Problem Relation Age of Onset  . Coronary artery disease Father   . Heart disease Father   . Hypertension Father   . Heart attack Father   . Coronary artery disease Mother   . Heart failure Mother   . Dementia Mother   . Heart disease Mother     Heart disease before age 62  . Hypertension Mother   . Heart attack Mother   . Diabetes      siblings  . Hypertension Daughter   . Anesthesia problems Neg Hx   .  Hypotension Neg Hx   . Malignant hyperthermia Neg Hx   . Pseudochol deficiency Neg Hx   . Heart disease Brother   . Hypertension Brother      Prior to Admission medications   Medication Sig Start Date End Date Taking? Authorizing Provider  allopurinol (ZYLOPRIM) 100 MG tablet Take 100 mg by mouth daily.   Yes Historical Provider, MD  ALPRAZolam (XANAX) 0.25 MG tablet Take 0.25 mg by mouth every 4 (four) hours.    Yes Historical Provider, MD  atenolol (TENORMIN) 50 MG tablet Take 50 mg by mouth daily.   Yes Historical Provider, MD  bisacodyl (BISACODYL) 5 MG EC tablet Take 5 mg by mouth daily as needed. For constipation   Yes Historical Provider, MD  HYDROcodone-acetaminophen (NORCO) 10-325 MG per tablet Take 2 tablets by mouth every 4 (four) hours as needed for pain.    Yes Historical Provider, MD  insulin NPH-insulin regular (NOVOLIN 70/30) (70-30) 100 UNIT/ML injection Inject 20-58 Units into the skin 2 (two) times daily. Sliding scale   Yes Historical Provider, MD  isosorbide mononitrate (IMDUR) 30 MG 24 hr tablet Take 30 mg by mouth every evening.    Yes Historical Provider, MD  methocarbamol (ROBAXIN) 500 MG tablet Take 500 mg by mouth every 6 (six) hours as needed for muscle spasms.    Yes Historical Provider, MD  Tamsulosin HCl (FLOMAX) 0.4 MG CAPS Take 0.8 mg by mouth daily.    Yes Historical Provider, MD  torsemide (DEMADEX) 20 MG tablet Take 10 mg by mouth daily. Resume in 3 days. 12/15/12  Yes Rodolph Bong, MD  triamterene-hydrochlorothiazide (MAXZIDE) 75-50  MG per tablet Take 1 tablet by mouth daily.   Yes Historical Provider, MD   Physical Exam: Filed Vitals:   03/24/13 1630  BP: 109/53  Pulse: 71  Temp: 97.7 F (36.5 C)  Resp: 16    BP 109/53  Pulse 71  Temp(Src) 97.7 F (36.5 C) (Oral)  Resp 16  SpO2 95%  BP 109/53  Pulse 71  Temp(Src) 97.7 F (36.5 C) (Oral)  Resp 16  SpO2 95%  General Appearance:    Alert, cooperative, no distress, appears stated age                 Neck:   Supple, symmetrical, trachea midline, no adenopathy;       thyroid:  No JVD     Lungs:     Clear to auscultation bilaterally, respirations unlabored     Heart:    Regular rate and rhythm, S1 and S2 normal, no murmur, rub   or gallop           Extremities:   Extremities normal, atraumatic, no cyanosis or edema, s/p  Pulses:   2+ and symmetric all extremities.     Lymph nodes:   Cervical, supraclavicular, and axillary nodes normal  Neurologic:   CNII-XII intact. Normal strength, sensation and reflexes      throughout             Labs on Admission:  Basic Metabolic Panel:  Recent Labs Lab 03/24/13 1120  NA 130*  K 5.5*  CL 79*  CO2 19  GLUCOSE 131*  BUN 94*  CREATININE 2.96*  CALCIUM 10.0   Liver Function Tests: No results found for this basename: AST, ALT, ALKPHOS, BILITOT, PROT, ALBUMIN,  in the last 168 hours No results found for this basename: LIPASE, AMYLASE,  in the last 168 hours No results found for this  basename: AMMONIA,  in the last 168 hours CBC:  Recent Labs Lab 03/24/13 1120 03/24/13 1300  WBC 49.9* 53.4*  NEUTROABS 46.4* 49.6*  HGB 17.8* 16.8  HCT 65.2* 56.9*  MCV 73.3* 72.4*  PLT 392 477*   Cardiac Enzymes: No results found for this basename: CKTOTAL, CKMB, CKMBINDEX, TROPONINI,  in the last 168 hours  BNP (last 3 results) No results found for this basename: PROBNP,  in the last 8760 hours CBG:  Recent Labs Lab 03/24/13 1647  GLUCAP 124*    Radiological Exams on Admission: Ct Abdomen  Pelvis Wo Contrast  03/24/2013   CLINICAL DATA:  Back pain  EXAM: CT ABDOMEN AND PELVIS WITHOUT CONTRAST  TECHNIQUE: Multidetector CT imaging of the abdomen and pelvis was performed following the standard protocol without intravenous contrast.  COMPARISON:  None.  FINDINGS: Sagittal images of the spine shows no destructive bony lesions. Mild disc space flattening with mild anterior spurring at L2-L3 level.  The study is markedly limited without IV contrast. Lung bases are unremarkable. There is heterogeneous low density lesion posterior aspect of the right hepatic lobe measures at least 6 cm. This is highly suspicious for primary or secondary malignancy. Further correlation with enhanced study is recommended. Extensive atherosclerotic calcifications abdominal aorta, bilateral proximal renal artery, SMA, splenic artery and iliac arteries are noted. No aortic aneurysm. Probable stent in right renal artery.  There is right adrenal lesion measures 6.7 by 3.7 cm. This is highly suspicious for primary or secondary malignancy. Further correlation with enhanced MRI is recommended.  The patient is status post cholecystectomy. A 2nd low-density lesion within right hepatic lobe anteriorly measures 2 cm. Unenhanced spleen is unremarkable. Mild pancreatic atrophy. The left adrenal gland is unremarkable.  Kidneys shows bilateral cortical thinning. No nephrolithiasis. No hydronephrosis or hydroureter. No small bowel obstruction. Mild distended urinary bladder. Prostate gland measures 3.7 x 4.2 cm. Atherosclerotic calcifications of femoral arteries. No calcified calculi are noted within urinary bladder. No ureteral calculi are identified.  IMPRESSION: 1. There is low density lesion posterior aspect of right hepatic lobe measures 6.3 cm highly suspicious for primary or secondary malignancy. Further assessment with enhanced study is recommended. There is right adrenal lesion measures 6.7 x 3.7 cm. Primary or secondary malignancy is  suspected. Further evaluation is recommended. 2. Extensive atherosclerotic vascular calcifications. 3. No hydronephrosis or hydroureter. 4. Mild distended urinary bladder. These results were called by telephone at the time of interpretation on 03/24/2013 at 2:34 PM to Dr. Blane Ohara , who verbally acknowledged these results. Mild enlarged prostate gland.   Electronically Signed   By: Natasha Mead M.D.   On: 03/24/2013 14:35    EKG: Independently reviewed. A.fib with RBBB.  Assessment/Plan Back pain/Liver mass/unintentional weight loss: - I will ahead and place him n.p.o. after midnight, get a CT scan of the chest to rule out primary lung malignancy. 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 He's had night sweats and weight loss. - Consult IR for biopsy. - Increase his home dose of narcotics and start him on something IV to better control the back pain. - Zofran for nausea.  Hyperkalemia: - Most likely due to decreased intravascular volume, EKG was done that showed no peak T waves. - Start on IV fluids check a basic metabolic panel in the morning.  Hyponatremia: - This most likely seems do to decreased intravascular volume.  - We'll go ahead and  start him on IV fluids for the next 12 hours repeat a basic metabolic panel in the morning.  Acute kidney injury/  CKD (chronic kidney disease) stage 3, GFR 30-59 ml/min: - Most likely prerenal ischemia, his mucous membranes are dry. His urine specific gravity is 1013, at home he is on Lasix and hydrochlorothiazide. We'll have stopped these.  - We'll start him on IV fluids, monitor strict I.'s and O.'s.  check a basic metabolic panel in the morning.   A-fib: - Currently rate controlled unknown reason why he is not on anticoagulation. He is not the best of historians.  - Hold aspirin.  HTN (hypertension): - Continue beta blocker hold diuretics.   - Seems to be  stable.  DM2 (diabetes mellitus, type 2) - Stop 70/30s, start Lantus half dose, check hemoglobin A1c start sliding scale insulin.   Family consulted interventional radiology for possible biopsy.  Code Status: full Family Communication: none Disposition Plan: inpatinet  Time spent: 80 minutes  Marinda Elk [facility] Pager [phone]

## 2013-03-24 NOTE — Progress Notes (Signed)
Chaplain paged to provide pastoral care to patient. Patient noticed that patient was very tearful. Patient had just received news of his medical status. Patient provided spiritual support through prayer. Chaplain referral completed.   03/24/13 1500  Clinical Encounter Type  Visited With Patient  Visit Type Initial;Spiritual support;Social support;ED  Referral From Nurse  Spiritual Encounters  Spiritual Needs Prayer;Emotional  Stress Factors  Patient Stress Factors Health changes;Major life changes

## 2013-03-24 NOTE — Progress Notes (Signed)
Pt arrived to unit via ED stretcher accompanied by RN. A&Ox3, anxious, NAD, skin warm and dry. Placed on bed alarm, fall precautions explained and implemented. Pt states he has an allergy to plastic tape. MD notified of pt arrival. Will continue to monitor.

## 2013-03-24 NOTE — ED Notes (Addendum)
CT notified that pt is unable to drink 2nd contrast and that Dr Jodi Mourning would like pt to go to CT despite that fact.

## 2013-03-25 ENCOUNTER — Inpatient Hospital Stay (HOSPITAL_COMMUNITY): Payer: Medicare Other

## 2013-03-25 DIAGNOSIS — K769 Liver disease, unspecified: Secondary | ICD-10-CM

## 2013-03-25 DIAGNOSIS — D72829 Elevated white blood cell count, unspecified: Secondary | ICD-10-CM

## 2013-03-25 DIAGNOSIS — I10 Essential (primary) hypertension: Secondary | ICD-10-CM

## 2013-03-25 LAB — GLUCOSE, CAPILLARY
Glucose-Capillary: 80 mg/dL (ref 70–99)
Glucose-Capillary: 84 mg/dL (ref 70–99)
Glucose-Capillary: 91 mg/dL (ref 70–99)

## 2013-03-25 LAB — CBC
HCT: 54 % — ABNORMAL HIGH (ref 39.0–52.0)
MCH: 20.8 pg — ABNORMAL LOW (ref 26.0–34.0)
MCV: 71.4 fL — ABNORMAL LOW (ref 78.0–100.0)
Platelets: 391 10*3/uL (ref 150–400)
RBC: 7.56 MIL/uL — ABNORMAL HIGH (ref 4.22–5.81)
RDW: 19.8 % — ABNORMAL HIGH (ref 11.5–15.5)
WBC: 43.7 10*3/uL — ABNORMAL HIGH (ref 4.0–10.5)

## 2013-03-25 LAB — HEMOGLOBIN A1C: Mean Plasma Glucose: 114 mg/dL (ref <117)

## 2013-03-25 LAB — COMPREHENSIVE METABOLIC PANEL
AST: 23 U/L (ref 0–37)
Albumin: 3.6 g/dL (ref 3.5–5.2)
Alkaline Phosphatase: 141 U/L — ABNORMAL HIGH (ref 39–117)
BUN: 87 mg/dL — ABNORMAL HIGH (ref 6–23)
CO2: 23 mEq/L (ref 19–32)
Chloride: 88 mEq/L — ABNORMAL LOW (ref 96–112)
GFR calc non Af Amer: 21 mL/min — ABNORMAL LOW (ref >90)
Potassium: 3.6 mEq/L (ref 3.5–5.1)
Total Bilirubin: 1.9 mg/dL — ABNORMAL HIGH (ref 0.3–1.2)
Total Protein: 5.7 g/dL — ABNORMAL LOW (ref 6.0–8.3)

## 2013-03-25 MED ORDER — INSULIN ASPART 100 UNIT/ML ~~LOC~~ SOLN
0.0000 [IU] | Freq: Three times a day (TID) | SUBCUTANEOUS | Status: DC
  Administered 2013-03-26: 2 [IU] via SUBCUTANEOUS
  Administered 2013-03-26 – 2013-03-27 (×2): 1 [IU] via SUBCUTANEOUS
  Administered 2013-03-27: 2 [IU] via SUBCUTANEOUS
  Administered 2013-03-28: 1 [IU] via SUBCUTANEOUS

## 2013-03-25 MED ORDER — HYDROMORPHONE HCL PF 1 MG/ML IJ SOLN
1.0000 mg | INTRAMUSCULAR | Status: DC | PRN
  Administered 2013-03-25 – 2013-03-26 (×6): 1 mg via INTRAVENOUS
  Filled 2013-03-25 (×6): qty 1

## 2013-03-25 MED ORDER — FENTANYL CITRATE 0.05 MG/ML IJ SOLN
INTRAMUSCULAR | Status: AC | PRN
  Administered 2013-03-25 (×2): 50 ug via INTRAVENOUS

## 2013-03-25 MED ORDER — MIDAZOLAM HCL 2 MG/2ML IJ SOLN
INTRAMUSCULAR | Status: AC | PRN
  Administered 2013-03-25 (×2): 1 mg via INTRAVENOUS

## 2013-03-25 MED ORDER — MIDAZOLAM HCL 2 MG/2ML IJ SOLN
INTRAMUSCULAR | Status: AC
  Filled 2013-03-25: qty 4

## 2013-03-25 MED ORDER — FENTANYL CITRATE 0.05 MG/ML IJ SOLN
INTRAMUSCULAR | Status: AC
  Filled 2013-03-25: qty 4

## 2013-03-25 MED ORDER — HYDROMORPHONE HCL 2 MG PO TABS
1.0000 mg | ORAL_TABLET | ORAL | Status: DC | PRN
  Administered 2013-03-27: 1 mg via ORAL
  Filled 2013-03-25: qty 1

## 2013-03-25 MED ORDER — SODIUM CHLORIDE 0.9 % IV BOLUS (SEPSIS)
500.0000 mL | Freq: Once | INTRAVENOUS | Status: AC
  Administered 2013-03-25: 500 mL via INTRAVENOUS

## 2013-03-25 NOTE — Evaluation (Signed)
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 He relates his narcotics have been helping with the pain. Recently the pain medication has lost its effect. He relates he's had intermittent episodes like this 2 months prior to admission but didn't pay much attention. He relates some night sweats and unintentional 30 pound weight loss in about 2 months. He relates he can't lay flat on his back as the pain is unbearable. It feels better when he is on his left side.  Clinical Impression  Pt able to perform bed <> recliner transfers with min A, increased time due to pain, needs A for positioning foot due to poor sensation.  Pt unable to tolerate sitting in chair due to back pain, only comfortable when lying on L side.  PT discussed possible need for SNF placement due to pt's need for physical assist and pt's wife unable to provide physical assist, pt states he is not willing to consider SNF at this time.    PT Assessment  Patient needs continued PT services    Follow Up Recommendations  SNF;Supervision/Assistance - 24 hour;Home health PT    Does the patient have the potential to tolerate intense rehabilitation      Barriers to Discharge Decreased caregiver support wife not able to provide physical assist    Equipment Recommendations  None recommended by PT    Recommendations for Other Services     Frequency Min 3X/week    Precautions / Restrictions Precautions Precautions: Fall Restrictions Weight Bearing Restrictions: No    Pertinent Vitals/Pain Pt c/o 10/10 back pain when sitting, RN aware, repositioned appropriately      Mobility  Bed Mobility Bed Mobility: Supine to Sit;Sit to Supine Supine to Sit: 4: Min assist;With rails Sit to Supine: 4: Min assist;With rail Details for Bed Mobility Assistance: pt requires increased time and assistance due to pain Transfers Transfers: Lateral/Scoot Transfers Lateral/Scoot Transfers: 4: Min assist Details for Transfer Assistance: pt transfered to recliner, even surface transfer scooting with min A.  Pt limited by pain and weakness.    Exercises     PT Diagnosis: Generalized weakness;Acute pain  PT Problem List: Decreased strength;Decreased activity tolerance;Decreased mobility;Pain PT Treatment Interventions: DME instruction;Gait training;Functional mobility training;Therapeutic activities;Therapeutic exercise;Balance training;Neuromuscular re-education;Patient/family education;Wheelchair mobility training;Manual techniques;Modalities     PT Goals(Current goals can be found in the care plan section) Acute Rehab PT Goals Patient Stated Goal: get some food PT Goal Formulation: With patient Time For Goal Achievement: 04/08/13 Potential to Achieve Goals: Good  Visit Information  Last PT Received On: 03/25/13 Assistance Needed: +1 History of Present Illness: 72 year old with past medical history of 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 He relates his narcotics have been helping with the pain. Recently the pain medication has lost its effect. He relates he's had intermittent episodes like this 2 months prior to admission but didn't pay much attention.  He relates some night sweats and unintentional 30 pound weight loss in about 2 months. He relates he can't lay flat on  his back as the pain is unbearable. It feels better when he is on his left side.       Prior Functioning  Home Living Family/patient expects to be discharged to:: Private residence Living Arrangements: Spouse/significant other Available Help at Discharge: Family;Available 24 hours/day Type of Home: House Home Access: Ramped entrance Home Layout: Able to live on main level with bedroom/bathroom Home Equipment: Wheelchair - power;Wheelchair - manual;Grab bars - tub/shower;Grab bars - toilet;Shower seat;Walker - Curator - 2 wheels Prior Function Level of Independence: Independent with assistive device(s) Comments: uses w/c majority of the time Communication Communication: No difficulties    Cognition  Cognition Arousal/Alertness: Awake/alert Behavior During Therapy: WFL for tasks assessed/performed Overall Cognitive Status: Within Functional Limits for tasks assessed    Extremity/Trunk Assessment Lower Extremity Assessment Lower Extremity Assessment: Generalized weakness Cervical / Trunk Assessment Cervical / Trunk Assessment: Normal   Balance Dynamic Sitting Balance Dynamic Sitting - Balance Support: During functional activity Dynamic Sitting - Level of Assistance: 6: Modified independent (Device/Increase time) Dynamic Sitting - Comments: pt able to lean all directions for clothing management without LOB  End of Session PT - End of Session Activity Tolerance: Patient limited by pain Patient left: in bed;with call bell/phone within reach;with bed alarm set Nurse Communication: Mobility status  GP     Donald Dudley 03/25/2013, 8:18 AM

## 2013-03-25 NOTE — Progress Notes (Signed)
UR Completed.  Carter Kaman Jane [phone] 03/25/2013  

## 2013-03-25 NOTE — Progress Notes (Signed)
INITIAL NUTRITION ASSESSMENT  DOCUMENTATION CODES Per approved criteria  -Severe malnutrition in the context of chronic illness   INTERVENTION: Recommend chocolate Glucerna Shake po BID between meals once diet order permits (chocolate flavor can be delivered by request, please Secretary/administrator.) RD to continue to follow nutrition care plan.  NUTRITION DIAGNOSIS: Inadequate oral intake related to GI distress as evidenced by severe weight loss and poor intake.   Goal: Diet advancement as tolerated. Intake to meet >90% of estimated nutrition needs.  Monitor:  weight trends, lab trends, I/O's, diet advancement  Reason for Assessment: Consult + Malnutrition Screening Tool  72 y.o. male  Admitting Dx: Liver mass  ASSESSMENT: PMHx significant for CLL s/p L BKA, PVD, CKD stage III, HF. Admitted with R-sided back pain and abdominal pain x 1 week. He also notes that he has had 30-lb unintentional wt loss x 2 months and night sweats. Work-up reveals large liver lesion and R adrenal mass.  Radiology is planning for biopsy of liver lesion.  Pt with 19% wt loss x 2 months - this is significant for time frame. Pt with obvious severe fat and muscle mass on body. Family reports that over the past 2 months pt's oral intake has drastically declined. Pt often dry heaves after breakfast, per daughter.  Meal intake reveals: Breakfast: 2 scrambled eggs Lunch: individual Easy mac Dinner: small portion of whatever wife has cooked  He occasionally drinks Chocolate Glucerna Shake. Pt became tearful when talking about his weight loss, he is scared and doesn't know what's wrong. Pt meets criteria for severe MALNUTRITION in the context of chronic illness as evidenced by 19% wt loss x 2 months, severe fat and muscle mass loss and intake of <75% of estimated energy intake x at least 1 month.   Height: Ht Readings from Last 1 Encounters:  03/25/13 5\' 8"  (1.727 m)    Weight: Wt Readings  from Last 1 Encounters:  03/24/13 129 lb 1.6 oz (58.559 kg)    Ideal Body Weight: 154 lb  % Ideal Body Weight: 84%  Wt Readings from Last 10 Encounters:  03/24/13 129 lb 1.6 oz (58.559 kg)  12/10/12 167 lb 1.7 oz (75.8 kg)  12/06/12 161 lb 9.6 oz (73.301 kg)  09/02/12 153 lb (69.4 kg)  04/09/12 149 lb 14.6 oz (68 kg)  04/09/12 149 lb 14.6 oz (68 kg)  12/04/11 150 lb (68.04 kg)  10/23/11 151 lb (68.493 kg)  10/02/11 157 lb (71.215 kg)  09/14/11 157 lb (71.215 kg)    Usual Body Weight: 160 lb  % Usual Body Weight: 81%  BMI:  Body mass index is 19.63 kg/(m^2). Normal  Estimated Nutritional Needs: Kcal: 1750 - 2000 Protein: at least 75 grams daily Fluid: 1.8 - 2 liters  Skin: abrasions  Diet Order: NPO  EDUCATION NEEDS: -No education needs identified at this time   Intake/Output Summary (Last 24 hours) at 03/25/13 1213 Last data filed at 03/25/13 0949  Gross per 24 hour  Intake      0 ml  Output    800 ml  Net   -800 ml    Last BM: 12/10  Labs:   Recent Labs Lab 03/24/13 1120 03/24/13 1840 03/25/13 0530  NA 130*  --  134*  K 5.5*  --  3.6  CL 79*  --  88*  CO2 19  --  23  BUN 94*  --  87*  CREATININE 2.96* 2.89* 2.75*  CALCIUM 10.0  --  8.6  GLUCOSE 131*  --  52*    CBG (last 3)   Recent Labs  03/25/13 0413 03/25/13 0851 03/25/13 1104  GLUCAP 80 91 84    Scheduled Meds: . acetaminophen  1,000 mg Oral Once  . allopurinol  100 mg Oral Daily  . heparin  5,000 Units Subcutaneous Q8H  . insulin aspart  0-9 Units Subcutaneous Q4H  . insulin detemir  45 Units Subcutaneous QHS  . isosorbide mononitrate  30 mg Oral QPM  . tamsulosin  0.8 mg Oral Daily    Continuous Infusions:  none  Past Medical History  Diagnosis Date  . S/P BKA (below knee amputation)   . History of cholecystectomy   . Hx of CABG   . PVD (peripheral vascular disease)   . Peripheral neuropathy   . Dyslipidemia   . Hyperlipidemia   . Hypercholesterolemia   . Chest  pain   . CAD (coronary artery disease)     CABG - 1993  /  PTCA - 2004  /  Cath - 2006  . Gout     takes Allopurinol daily  . CHF (congestive heart failure)   . Polycythemia   . Atrial fibrillation     not on Coumadin  . MI (myocardial infarction) 1991  . Bruises easily   . Constipation     related to pain meds-takes OTC stool softener daily  . Peripheral edema     takes Torsemide prn  . Urinary frequency   . Enlarged prostate     takes flomax daily  . Anxiety     takes Xanax daily  . Insomnia     Ambien prn;but none if over a yr  . Anginal pain     Take isosorbide  . Shingles rash   . CLL (chronic lymphocytic leukemia)   . Arthritis   . Diabetes mellitus     takes Novolin 70/30;average fasting 100-120  . Hypertension     takes Atenolol,Maxzide,and Isosorbide daily  . Chronic kidney disease, stage III (moderate)   . Bilateral renal artery stenosis     status post stents    Past Surgical History  Procedure Laterality Date  . Coronary angioplasty with stent placement  2004    second diagonal artery -- [name], M.D.   . Cardiac catheterization  2006    Est. EF of 65% -- Diffuse coronary artery disease.  The second diagonal artery that was angioplastied in 2004 is now occluded.  It is no longer a candidate for PTCA since it is now flush occluded.  Normal LV systolic function.  [name], M.D.  . Renal artery stent  2003    Bilateral renal artery stenosis  . Total knee arthroplasty      left  . Distal clavicle excision  2003    SURGEON:  [name], M.D.  . Tendon repair      Left Achilles repair x2  . Coronary artery bypass graft  1993    left internal mammary artery to his LAD artery --   . Coronary artery bypass graft  1994    vein graft failure of in his right coronary artery  . Colonoscopy    . Esophagogastroduodenoscopy    . I&d extremity  09/10/2011    Procedure: IRRIGATION AND DEBRIDEMENT EXTREMITY;  Surgeon: Sherren Kerns, MD;   Location: [facility] OR;  Service: Vascular;  Laterality: Left;  OF BKA  . Pr vein bypass graft,aorto-fem-pop    . Joint replacement  1998    Left knee  . Below knee leg amputation      left  . Cholecystectomy    . I&d extremity  04/09/2012    Procedure: IRRIGATION AND DEBRIDEMENT EXTREMITY;  Surgeon: Sharma Covert, MD;  Location: [facility] OR;  Service: Orthopedics;  Laterality: Bilateral;  right middle finger, left index finger    Jarold Motto MS, RD, LDN Pager: [phone] After-hours pager: [phone]

## 2013-03-25 NOTE — Progress Notes (Signed)
Pt returned to unit arousable with family at bedside. bandaid to right abd with small amount of bloody drainage, drainage marked. Pt instructed on bedrest and orders to remain on left side. Will continue to monitor.

## 2013-03-25 NOTE — Consult Note (Signed)
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  Further work up with a CT scan revealed a 6 cm mass in the right lobe of the liver and a 6 cm mass in the right adrenal gland.  CT scan of the chest was negative for any evidence of malignancy.  He is undergoing his liver biopsy at this time.  The patient states that he never used any illicit drugs in the past.  At this point he received too much sedation to obtain any further history.  Past Medical History  Diagnosis Date  . S/P BKA (below knee amputation)   . History of cholecystectomy   . Hx of CABG   . PVD (peripheral vascular disease)   . Peripheral neuropathy   . Dyslipidemia   . Hyperlipidemia   . Hypercholesterolemia   . Chest pain   . CAD (coronary artery disease)     CABG - 1993  /  PTCA - 2004  /  Cath - 2006  . Gout     takes Allopurinol daily  . CHF (congestive heart failure)   . Polycythemia   . Atrial fibrillation     not on Coumadin  . MI (myocardial infarction) 1991  . Bruises easily   . Constipation     related to pain meds-takes OTC stool softener daily  . Peripheral edema     takes Torsemide prn  . Urinary frequency   . Enlarged prostate     takes flomax daily  . Anxiety     takes Xanax daily  . Insomnia     Ambien prn;but none if over a yr  . Anginal pain     Take isosorbide  . Shingles rash   . CLL (chronic lymphocytic leukemia)   . Arthritis   . Diabetes mellitus     takes Novolin 70/30;average fasting 100-120  . Hypertension     takes Atenolol,Maxzide,and Isosorbide daily  . Chronic kidney disease, stage III (moderate)   . Bilateral renal artery stenosis     status post stents    Past Surgical History  Procedure Laterality Date  . Coronary  angioplasty with stent placement  2004    second diagonal artery -- [name], M.D.   . Cardiac catheterization  2006    Est. EF of 65% -- Diffuse coronary artery disease.  The second diagonal artery that was angioplastied in 2004 is now occluded.  It is no longer a candidate for PTCA since it is now flush occluded.  Normal LV systolic function.  [name], M.D.  . Renal artery stent  2003    Bilateral renal artery stenosis  . Total knee arthroplasty      left  . Distal clavicle excision  2003    SURGEON:  [name], M.D.  . Tendon repair      Left Achilles repair x2  . Coronary artery bypass graft  1993    left internal mammary artery to his LAD artery --   . Coronary artery bypass graft  1994    vein graft failure of in his right coronary artery  . Colonoscopy    . Esophagogastroduodenoscopy    . I&d extremity  09/10/2011    Procedure: IRRIGATION AND DEBRIDEMENT  EXTREMITY;  Surgeon: Sherren Kerns, MD;  Location: [facility] OR;  Service: Vascular;  Laterality: Left;  OF BKA  . Pr vein bypass graft,aorto-fem-pop    . Joint replacement  1998    Left knee  . Below knee leg amputation      left  . Cholecystectomy    . I&d extremity  04/09/2012    Procedure: IRRIGATION AND DEBRIDEMENT EXTREMITY;  Surgeon: Sharma Covert, MD;  Location: [facility] OR;  Service: Orthopedics;  Laterality: Bilateral;  right middle finger, left index finger    Family History  Problem Relation Age of Onset  . Coronary artery disease Father   . Heart disease Father   . Hypertension Father   . Heart attack Father   . Coronary artery disease Mother   . Heart failure Mother   . Dementia Mother   . Heart disease Mother     Heart disease before age 57  . Hypertension Mother   . Heart attack Mother   . Diabetes      siblings  . Hypertension Daughter   . Anesthesia problems Neg Hx   . Hypotension Neg Hx   . Malignant hyperthermia Neg Hx   . Pseudochol deficiency Neg Hx   . Heart disease  Brother   . Hypertension Brother     Social History:  reports that he quit smoking about 36 years ago. His smoking use included Cigarettes. He has a 75 pack-year smoking history. He has never used smokeless tobacco. He reports that he does not drink alcohol or use illicit drugs.  Allergies:  Allergies  Allergen Reactions  . Amiodarone Swelling  . Colchicine Swelling    diarrhea  . Crestor [Rosuvastatin Calcium] Swelling  . Lipitor [Atorvastatin Calcium] Swelling  . Lisinopril Other (See Comments)    Unknown reaction  . Lyrica [Pregabalin] Swelling  . Mevacor [Lovastatin] Swelling    GI-bleed   . Micardis [Telmisartan] Other (See Comments)    Unknown reaction  . Neurontin [Gabapentin] Swelling  . Norvasc [Amlodipine Besylate] Swelling  . Potassium Chloride Er Other (See Comments)    Unknown reaction  . Ranolazine Er Other (See Comments)    Unknown reaction  . Zocor [Simvastatin - High Dose] Swelling    Medications:  Scheduled: . acetaminophen  1,000 mg Oral Once  . allopurinol  100 mg Oral Daily  . fentaNYL      . heparin  5,000 Units Subcutaneous Q8H  . insulin aspart  0-9 Units Subcutaneous Q4H  . insulin detemir  45 Units Subcutaneous QHS  . isosorbide mononitrate  30 mg Oral QPM  . midazolam      . tamsulosin  0.8 mg Oral Daily   Continuous:   Results for orders placed during the hospital encounter of 03/24/13 (from the past 24 hour(s))  GLUCOSE, CAPILLARY     Status: Abnormal   Collection Time    03/24/13  4:47 PM      Result Value Range   Glucose-Capillary 124 (*) 70 - 99 mg/dL  CBC     Status: Abnormal   Collection Time    03/24/13  6:40 PM      Result Value Range   WBC 49.9 (*) 4.0 - 10.5 K/uL   RBC 7.96 (*) 4.22 - 5.81 MIL/uL   Hemoglobin 16.5  13.0 - 17.0 g/dL   HCT 96.0 (*) 45.4 - 09.8 %   MCV 71.6 (*) 78.0 - 100.0 fL   MCH 20.7 (*) 26.0 - 34.0 pg  MCHC 28.9 (*) 30.0 - 36.0 g/dL   RDW 16.1 (*) 09.6 - 04.5 %   Platelets 385  150 - 400 K/uL   CREATININE, SERUM     Status: Abnormal   Collection Time    03/24/13  6:40 PM      Result Value Range   Creatinine, Ser 2.89 (*) 0.50 - 1.35 mg/dL   GFR calc non Af Amer 20 (*) >90 mL/min   GFR calc Af Amer 23 (*) >90 mL/min  PROTIME-INR     Status: None   Collection Time    03/24/13  6:40 PM      Result Value Range   Prothrombin Time 14.1  11.6 - 15.2 seconds   INR 1.11  0.00 - 1.49  HEMOGLOBIN A1C     Status: None   Collection Time    03/24/13  6:40 PM      Result Value Range   Hemoglobin A1C 5.6  <5.7 %   Mean Plasma Glucose 114  <117 mg/dL  GLUCOSE, CAPILLARY     Status: Abnormal   Collection Time    03/24/13  8:43 PM      Result Value Range   Glucose-Capillary 156 (*) 70 - 99 mg/dL  GLUCOSE, CAPILLARY     Status: None   Collection Time    03/25/13 12:14 AM      Result Value Range   Glucose-Capillary 87  70 - 99 mg/dL  GLUCOSE, CAPILLARY     Status: None   Collection Time    03/25/13  4:13 AM      Result Value Range   Glucose-Capillary 80  70 - 99 mg/dL  COMPREHENSIVE METABOLIC PANEL     Status: Abnormal   Collection Time    03/25/13  5:30 AM      Result Value Range   Sodium 134 (*) 135 - 145 mEq/L   Potassium 3.6  3.5 - 5.1 mEq/L   Chloride 88 (*) 96 - 112 mEq/L   CO2 23  19 - 32 mEq/L   Glucose, Bld 52 (*) 70 - 99 mg/dL   BUN 87 (*) 6 - 23 mg/dL   Creatinine, Ser 4.09 (*) 0.50 - 1.35 mg/dL   Calcium 8.6  8.4 - 81.1 mg/dL   Total Protein 5.7 (*) 6.0 - 8.3 g/dL   Albumin 3.6  3.5 - 5.2 g/dL   AST 23  0 - 37 U/L   ALT 12  0 - 53 U/L   Alkaline Phosphatase 141 (*) 39 - 117 U/L   Total Bilirubin 1.9 (*) 0.3 - 1.2 mg/dL   GFR calc non Af Amer 21 (*) >90 mL/min   GFR calc Af Amer 25 (*) >90 mL/min  CBC     Status: Abnormal   Collection Time    03/25/13  5:30 AM      Result Value Range   WBC 43.7 (*) 4.0 - 10.5 K/uL   RBC 7.56 (*) 4.22 - 5.81 MIL/uL   Hemoglobin 15.7  13.0 - 17.0 g/dL   HCT 91.4 (*) 78.2 - 95.6 %   MCV 71.4 (*) 78.0 - 100.0 fL   MCH 20.8  (*) 26.0 - 34.0 pg   MCHC 29.1 (*) 30.0 - 36.0 g/dL   RDW 21.3 (*) 08.6 - 57.8 %   Platelets 391  150 - 400 K/uL  GLUCOSE, CAPILLARY     Status: None   Collection Time    03/25/13  8:51 AM  Result Value Range   Glucose-Capillary 91  70 - 99 mg/dL  GLUCOSE, CAPILLARY     Status: None   Collection Time    03/25/13 11:04 AM      Result Value Range   Glucose-Capillary 84  70 - 99 mg/dL     Ct Abdomen Pelvis Wo Contrast  03/24/2013   CLINICAL DATA:  Back pain  EXAM: CT ABDOMEN AND PELVIS WITHOUT CONTRAST  TECHNIQUE: Multidetector CT imaging of the abdomen and pelvis was performed following the standard protocol without intravenous contrast.  COMPARISON:  None.  FINDINGS: Sagittal images of the spine shows no destructive bony lesions. Mild disc space flattening with mild anterior spurring at L2-L3 level.  The study is markedly limited without IV contrast. Lung bases are unremarkable. There is heterogeneous low density lesion posterior aspect of the right hepatic lobe measures at least 6 cm. This is highly suspicious for primary or secondary malignancy. Further correlation with enhanced study is recommended. Extensive atherosclerotic calcifications abdominal aorta, bilateral proximal renal artery, SMA, splenic artery and iliac arteries are noted. No aortic aneurysm. Probable stent in right renal artery.  There is right adrenal lesion measures 6.7 by 3.7 cm. This is highly suspicious for primary or secondary malignancy. Further correlation with enhanced MRI is recommended.  The patient is status post cholecystectomy. A 2nd low-density lesion within right hepatic lobe anteriorly measures 2 cm. Unenhanced spleen is unremarkable. Mild pancreatic atrophy. The left adrenal gland is unremarkable.  Kidneys shows bilateral cortical thinning. No nephrolithiasis. No hydronephrosis or hydroureter. No small bowel obstruction. Mild distended urinary bladder. Prostate gland measures 3.7 x 4.2 cm. Atherosclerotic  calcifications of femoral arteries. No calcified calculi are noted within urinary bladder. No ureteral calculi are identified.  IMPRESSION: 1. There is low density lesion posterior aspect of right hepatic lobe measures 6.3 cm highly suspicious for primary or secondary malignancy. Further assessment with enhanced study is recommended. There is right adrenal lesion measures 6.7 x 3.7 cm. Primary or secondary malignancy is suspected. Further evaluation is recommended. 2. Extensive atherosclerotic vascular calcifications. 3. No hydronephrosis or hydroureter. 4. Mild distended urinary bladder. These results were called by telephone at the time of interpretation on 03/24/2013 at 2:34 PM to Dr. Blane Ohara , who verbally acknowledged these results. Mild enlarged prostate gland.   Electronically Signed   By: Natasha Mead M.D.   On: 03/24/2013 14:35   Ct Chest Wo Contrast  03/24/2013   CLINICAL DATA:  Abnormal finding on the abdomen and pelvis CT. A low-density liver lesion suggests a primary versus metastatic neoplasm. Large adrenal mass on the right also evident on the CT. Patient has no chest complaints.  EXAM: CT CHEST WITHOUT CONTRAST  TECHNIQUE: Multidetector CT imaging of the chest was performed following the standard protocol without IV contrast.  COMPARISON:  Abdomen and pelvis CT, 03/24/2013  FINDINGS: There are multiple bilateral calcified granulomas. There is no lung nodule or mass to suggest a primary lung malignancy as the source for the liver and adrenal lesions. There are changes of moderate centrilobular emphysema. Minor subsegmental atelectasis is noted at the bases. There is no consolidation or edema. No pleural effusion or pneumothorax is seen.  The heart is mildly enlarged. There are dense coronary artery calcifications. No mediastinal or hilar masses or pathologically enlarged lymph nodes are appreciated.  There are no osteoblastic or osteolytic lesions.  IMPRESSION: 1. No lung mass or significant  nodule is seen to suggest a primary lung malignancy as a source of  the liver or adrenal lesions. There is no evidence of lung metastatic disease. 2. Multiple bilateral small calcified lung granulomas are noted. 3. There are changes of emphysema, but no acute findings in the lungs. 4. No mediastinal or hilar masses or enlarged lymph nodes.   Electronically Signed   By: Amie Portland M.D.   On: 03/24/2013 20:16    ROS:  As stated above in the HPI otherwise negative.  Blood pressure 113/62, pulse 75, temperature 97.9 F (36.6 C), temperature source Oral, resp. rate 20, height 5\' 8"  (1.727 m), weight 129 lb 1.6 oz (58.559 kg), SpO2 97.00%.    PE: Gen: NAD, Alert and Oriented  Assessment/Plan: 1) Liver mass. 2) Right adrenal mass. 3) PCV. 4) PVD. 5) CHF.    The patient is undergoing the liver biopsy biopsy.   Plan: 1) Await liver biopsy results. 2) Check HBV and HCV serology. 3) Agree with AFP.  Aubreana Cornacchia D 03/25/2013, 1:45 PM

## 2013-03-25 NOTE — Progress Notes (Signed)
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 He relates his narcotics have been helping with the pain. Recently the pain medication has lost its effect. He relates he's had intermittent episodes like this 2 months prior to admission but didn't pay much attention. He relates some night sweats and unintentional 30 pound weight loss in about 2 months. A CT scan of the abdomen revealed a liver mass and he is undergoing a biopsy this afternoon.   Assessment/Plan: Back pain/Liver mass/unintentional weight loss:  - Consult IR for biopsy. CT chest did not reveal any lung nodules or masses to suggest a primary lung malignancy.  - Increase his home dose of narcotics and start him on something IV to better control the back pain.  - Zofran for nausea.  - Will let Dr Myrle Sheng know about patient's admission.  - get alpha fetoprotein, .  Hyperkalemia:  - resolved after fluid hydration .  - Most likely due to decreased intravascular volume, EKG was done that showed no peak T waves.   Hyponatremia:  - improved - This most likely seems do to decreased intravascular volume.  - We'll go ahead and start him on IV fluids for the next 12 hours repeat a basic metabolic panel in the morning showed improvement.   Acute kidney injury/ CKD (chronic kidney disease) stage 3, GFR 30-59 ml/min:  - Most likely prerenal ischemia, his mucous membranes are dry. His urine specific gravity is 1013, at home he is on Lasix and hydrochlorothiazide. We'll have stopped these. Baseline creatinine is between 1.8 to 2. And his creatinine on admission is 2.8.  - We'll  start him on IV fluids, monitor strict I.'s and O.'s.  A-fib:  - Currently rate controlled unknown reason why he is not on anticoagulation. He is not the best of historians.  - Hold aspirin.  HTN (hypertension):  - Continue beta blocker hold diuretics.  - Seems to be stable.  DM2 (diabetes mellitus, type 2)  - Stop 70/30s, start Lantus half dose, HGBA1C is 5.6, start sliding scale insulin. CBG (last 3)   Recent Labs  03/25/13 0014 03/25/13 0413 03/25/13 0851  GLUCAP 87 80 91    Hypotension: - unclear etiology, probably from dehydration. Received fluid boluses and his bp parameters improved. He is currently asymptomatic.   Leukocytosis; - probably from the CLL.    DVT prophylaxis - SCD'S   Code Status: full code Family Communication: family at bedside, discussed the plan of care with the patient and family at bedside Disposition Plan: admitted to inpatient.    Consultants:  IR  Procedures:  Liver biopsy.   Antibiotics:  NONE  HPI/Subjective: REQUESTING pain medication to be more frequent. Slightly anxious about the liver mass.   Objective: Filed Vitals:   03/25/13 1106  BP: 113/62  Pulse: 73  Temp: 97.9 F (36.6 C)  Resp: 18    Intake/Output Summary (Last 24 hours) at 03/25/13 1121 Last data filed at 03/25/13 0949  Gross per 24 hour  Intake      0 ml  Output    800 ml  Net   -800 ml   Filed Weights   03/24/13 2044  Weight: 58.559 kg (129 lb 1.6 oz)    Exam:   General:  Alert afebrile comfortable  Cardiovascular: s1s2  Respiratory: ctab  Abdomen: soft TENDER in the RUQ bs+  Musculoskeletal: left BKA  Data Reviewed: Basic Metabolic Panel:  Recent Labs Lab 03/24/13 1120 03/24/13 1840 03/25/13 0530  NA 130*  --  134*  K 5.5*  --  3.6  CL 79*  --  88*  CO2 19  --  23  GLUCOSE 131*  --  52*  BUN 94*  --  87*  CREATININE 2.96* 2.89* 2.75*  CALCIUM 10.0  --  8.6   Liver Function Tests:  Recent Labs Lab 03/25/13 0530   AST 23  ALT 12  ALKPHOS 141*  BILITOT 1.9*  PROT 5.7*  ALBUMIN 3.6   No results found for this basename: LIPASE, AMYLASE,  in the last 168 hours No results found for this basename: AMMONIA,  in the last 168 hours CBC:  Recent Labs Lab 03/24/13 1120 03/24/13 1300 03/24/13 1840 03/25/13 0530  WBC 49.9* 53.4* 49.9* 43.7*  NEUTROABS 46.4* 49.6*  --   --   HGB 17.8* 16.8 16.5 15.7  HCT 65.2* 56.9* 57.0* 54.0*  MCV 73.3* 72.4* 71.6* 71.4*  PLT 392 477* 385 391   Cardiac Enzymes: No results found for this basename: CKTOTAL, CKMB, CKMBINDEX, TROPONINI,  in the last 168 hours BNP (last 3 results) No results found for this basename: PROBNP,  in the last 8760 hours CBG:  Recent Labs Lab 03/24/13 1647 03/24/13 2043 03/25/13 0014 03/25/13 0413 03/25/13 0851  GLUCAP 124* 156* 87 80 91    No results found for this or any previous visit (from the past 240 hour(s)).   Studies: Ct Abdomen Pelvis Wo Contrast  03/24/2013   CLINICAL DATA:  Back pain  EXAM: CT ABDOMEN AND PELVIS WITHOUT CONTRAST  TECHNIQUE: Multidetector CT imaging of the abdomen and pelvis was performed following the standard protocol without intravenous contrast.  COMPARISON:  None.  FINDINGS: Sagittal images of the spine shows no destructive bony lesions. Mild disc space flattening with mild anterior spurring at L2-L3 level.  The study is markedly limited without IV contrast. Lung bases are unremarkable. There is heterogeneous low density lesion posterior aspect of the right hepatic lobe measures at least 6 cm. This is highly suspicious for primary or secondary malignancy. Further correlation with enhanced study is recommended. Extensive atherosclerotic calcifications abdominal aorta, bilateral proximal renal artery, SMA, splenic artery and iliac arteries are noted. No aortic aneurysm. Probable stent in right renal artery.  There is right adrenal lesion measures 6.7 by 3.7 cm. This is highly suspicious for primary or  secondary malignancy. Further correlation with enhanced MRI is recommended.  The patient is status post cholecystectomy. A 2nd low-density lesion within right hepatic lobe anteriorly measures 2 cm. Unenhanced spleen is unremarkable. Mild pancreatic atrophy. The left adrenal gland is unremarkable.  Kidneys shows bilateral cortical thinning. No nephrolithiasis. No hydronephrosis or hydroureter. No small bowel obstruction. Mild distended urinary bladder. Prostate gland measures 3.7 x 4.2 cm. Atherosclerotic calcifications of femoral arteries. No calcified calculi are noted within urinary bladder. No ureteral calculi are identified.  IMPRESSION: 1. There is low density lesion posterior aspect of right hepatic lobe measures 6.3 cm highly suspicious for primary or secondary malignancy. Further assessment with enhanced study is recommended. There is right adrenal lesion measures 6.7 x 3.7 cm. Primary or secondary malignancy is suspected. Further evaluation is recommended. 2. Extensive atherosclerotic vascular calcifications. 3.  No hydronephrosis or hydroureter. 4. Mild distended urinary bladder. These results were called by telephone at the time of interpretation on 03/24/2013 at 2:34 PM to Dr. Blane Ohara , who verbally acknowledged these results. Mild enlarged prostate gland.   Electronically Signed   By: Natasha Mead M.D.   On: 03/24/2013 14:35   Ct Chest Wo Contrast  03/24/2013   CLINICAL DATA:  Abnormal finding on the abdomen and pelvis CT. A low-density liver lesion suggests a primary versus metastatic neoplasm. Large adrenal mass on the right also evident on the CT. Patient has no chest complaints.  EXAM: CT CHEST WITHOUT CONTRAST  TECHNIQUE: Multidetector CT imaging of the chest was performed following the standard protocol without IV contrast.  COMPARISON:  Abdomen and pelvis CT, 03/24/2013  FINDINGS: There are multiple bilateral calcified granulomas. There is no lung nodule or mass to suggest a primary lung  malignancy as the source for the liver and adrenal lesions. There are changes of moderate centrilobular emphysema. Minor subsegmental atelectasis is noted at the bases. There is no consolidation or edema. No pleural effusion or pneumothorax is seen.  The heart is mildly enlarged. There are dense coronary artery calcifications. No mediastinal or hilar masses or pathologically enlarged lymph nodes are appreciated.  There are no osteoblastic or osteolytic lesions.  IMPRESSION: 1. No lung mass or significant nodule is seen to suggest a primary lung malignancy as a source of the liver or adrenal lesions. There is no evidence of lung metastatic disease. 2. Multiple bilateral small calcified lung granulomas are noted. 3. There are changes of emphysema, but no acute findings in the lungs. 4. No mediastinal or hilar masses or enlarged lymph nodes.   Electronically Signed   By: Amie Portland M.D.   On: 03/24/2013 20:16    Scheduled Meds: . acetaminophen  1,000 mg Oral Once  . allopurinol  100 mg Oral Daily  . heparin  5,000 Units Subcutaneous Q8H  . insulin aspart  0-9 Units Subcutaneous Q4H  . insulin detemir  45 Units Subcutaneous QHS  . isosorbide mononitrate  30 mg Oral QPM  . tamsulosin  0.8 mg Oral Daily   Continuous Infusions:   Principal Problem:   Liver mass Active Problems:   A-fib   HTN (hypertension)   DM2 (diabetes mellitus, type 2)   CKD (chronic kidney disease) stage 3, GFR 30-59 ml/min   Hyponatremia   AKI (acute kidney injury)    Time spent: 25  minutes    Waldon Sheerin  [facility] Pager [phone]. If 7PM-7AM, please contact night-coverage at [email or web address], password [facility] 03/25/2013, 11:21 AM  LOS: 1 day

## 2013-03-25 NOTE — Procedures (Signed)
Successful RT LIVER MASS 18G CORE BX NO COMP STABLE FULL REPORT IN PACS PATH PENDING  

## 2013-03-25 NOTE — Consult Note (Signed)
HPI: Donald Dudley is an 72 y.o. male admitted with severe right flank and back pain. His workup finds evidence of a large liver lesion as well as a right adrenal mass, which was actually seen on Renal US in 8/14. IR is asked to biopsy the liver lesion to get a tissue diagnosis. PMHx and meds reviewed. Pt has no c/o aside from pain.  Past Medical History:  Past Medical History  Diagnosis Date  . S/P BKA (below knee amputation)   . History of cholecystectomy   . Hx of CABG   . PVD (peripheral vascular disease)   . Peripheral neuropathy   . Dyslipidemia   . Hyperlipidemia   . Hypercholesterolemia   . Chest pain   . CAD (coronary artery disease)     CABG - 1993  /  PTCA - 2004  /  Cath - 2006  . Gout     takes Allopurinol daily  . CHF (congestive heart failure)   . Polycythemia   . Atrial fibrillation     not on Coumadin  . MI (myocardial infarction) 1991  . Bruises easily   . Constipation     related to pain meds-takes OTC stool softener daily  . Peripheral edema     takes Torsemide prn  . Urinary frequency   . Enlarged prostate     takes flomax daily  . Anxiety     takes Xanax daily  . Insomnia     Ambien prn;but none if over a yr  . Anginal pain     Take isosorbide  . Shingles rash   . CLL (chronic lymphocytic leukemia)   . Arthritis   . Diabetes mellitus     takes Novolin 70/30;average fasting 100-120  . Hypertension     takes Atenolol,Maxzide,and Isosorbide daily  . Chronic kidney disease, stage III (moderate)   . Bilateral renal artery stenosis     status post stents    Past Surgical History:  Past Surgical History  Procedure Laterality Date  . Coronary angioplasty with stent placement  2004    second diagonal artery -- [name], M.D.   . Cardiac catheterization  2006    Est. EF of 65% -- Diffuse coronary artery disease.  The second diagonal artery that was angioplastied in 2004 is now occluded.  It is no longer a candidate for PTCA since it  is now flush occluded.  Normal LV systolic function.  [name], M.D.  . Renal artery stent  2003    Bilateral renal artery stenosis  . Total knee arthroplasty      left  . Distal clavicle excision  2003    SURGEON:  [name], M.D.  . Tendon repair      Left Achilles repair x2  . Coronary artery bypass graft  1993    left internal mammary artery to his LAD artery --   . Coronary artery bypass graft  1994    vein graft failure of in his right coronary artery  . Colonoscopy    . Esophagogastroduodenoscopy    . I&d extremity  09/10/2011    Procedure: IRRIGATION AND DEBRIDEMENT EXTREMITY;  Surgeon: Sherren Kerns, MD;  Location: [facility] OR;  Service: Vascular;  Laterality: Left;  OF BKA  . Pr vein bypass graft,aorto-fem-pop    . Joint replacement  1998    Left knee  . Below knee leg amputation      left  . Cholecystectomy    .  I&d extremity  04/09/2012    Procedure: IRRIGATION AND DEBRIDEMENT EXTREMITY;  Surgeon: Sharma Covert, MD;  Location: [facility] OR;  Service: Orthopedics;  Laterality: Bilateral;  right middle finger, left index finger    Family History:  Family History  Problem Relation Age of Onset  . Coronary artery disease Father   . Heart disease Father   . Hypertension Father   . Heart attack Father   . Coronary artery disease Mother   . Heart failure Mother   . Dementia Mother   . Heart disease Mother     Heart disease before age 84  . Hypertension Mother   . Heart attack Mother   . Diabetes      siblings  . Hypertension Daughter   . Anesthesia problems Neg Hx   . Hypotension Neg Hx   . Malignant hyperthermia Neg Hx   . Pseudochol deficiency Neg Hx   . Heart disease Brother   . Hypertension Brother     Social History:  reports that he quit smoking about 36 years ago. His smoking use included Cigarettes. He has a 75 pack-year smoking history. He has never used smokeless tobacco. He reports that he does not drink alcohol or use illicit  drugs.  Allergies:  Allergies  Allergen Reactions  . Amiodarone Swelling  . Colchicine Swelling    diarrhea  . Crestor [Rosuvastatin Calcium] Swelling  . Lipitor [Atorvastatin Calcium] Swelling  . Lisinopril Other (See Comments)    Unknown reaction  . Lyrica [Pregabalin] Swelling  . Mevacor [Lovastatin] Swelling    GI-bleed   . Micardis [Telmisartan] Other (See Comments)    Unknown reaction  . Neurontin [Gabapentin] Swelling  . Norvasc [Amlodipine Besylate] Swelling  . Potassium Chloride Er Other (See Comments)    Unknown reaction  . Ranolazine Er Other (See Comments)    Unknown reaction  . Zocor [Simvastatin - High Dose] Swelling    Medications:   Medication List    ASK your doctor about these medications       allopurinol 100 MG tablet  Commonly known as:  ZYLOPRIM  Take 100 mg by mouth daily.     ALPRAZolam 0.25 MG tablet  Commonly known as:  XANAX  Take 0.25 mg by mouth every 4 (four) hours as needed for anxiety.     atenolol 50 MG tablet  Commonly known as:  TENORMIN  Take 50 mg by mouth daily.     bisacodyl 5 MG EC tablet  Generic drug:  bisacodyl  Take 5 mg by mouth daily as needed. For constipation     FLOMAX 0.4 MG Caps capsule  Generic drug:  tamsulosin  Take 0.8 mg by mouth daily.     HYDROcodone-acetaminophen 10-325 MG per tablet  Commonly known as:  NORCO  Take 2 tablets by mouth every 4 (four) hours as needed for pain.     insulin NPH-regular (70-30) 100 UNIT/ML injection  Commonly known as:  NOVOLIN 70/30  Inject 20-58 Units into the skin 2 (two) times daily. Sliding scale     isosorbide mononitrate 30 MG 24 hr tablet  Commonly known as:  IMDUR  Take 30 mg by mouth every evening.     methocarbamol 500 MG tablet  Commonly known as:  ROBAXIN  Take 500 mg by mouth every 6 (six) hours as needed for muscle spasms.     torsemide 20 MG tablet  Commonly known as:  DEMADEX  Take 10 mg by mouth daily. Resume in 3 days.  triamterene-hydrochlorothiazide 75-50 MG per tablet  Commonly known as:  MAXZIDE  Take 1 tablet by mouth daily.        Please HPI for pertinent positives, otherwise complete 10 system ROS negative.  Physical Exam: BP 111/56  Pulse 72  Temp(Src) 97.7 F (36.5 C) (Oral)  Resp 18  Wt 129 lb 1.6 oz (58.559 kg)  SpO2 98% Body mass index is 19.63 kg/(m^2).   General Appearance:  Alert, cooperative, no distress, appears stated age  Head:  Normocephalic, without obvious abnormality, atraumatic  ENT: Unremarkable  Neck: Supple, symmetrical, trachea midline  Lungs:   Clear to auscultation bilaterally, no w/r/r,  Chest Wall:  No tenderness or deformity. Sternotomy scar well healed  Heart:  Regular rate and rhythm, S1, S2 normal, no murmur, rub or gallop.  Abdomen:   Soft, non-tender, non distended.  Neurologic: Normal affect, no gross deficits.   Results for orders placed during the hospital encounter of 03/24/13 (from the past 48 hour(s))  CBC WITH DIFFERENTIAL     Status: Abnormal   Collection Time    03/24/13 11:20 AM      Result Value Range   WBC 49.9 (*) 4.0 - 10.5 K/uL   RBC 8.90 (*) 4.22 - 5.81 MIL/uL   Comment: RESULTS CONFIRMED BY MANUAL DILUTION   Hemoglobin 17.8 (*) 13.0 - 17.0 g/dL   Comment: RESULTS CONFIRMED BY MANUAL DILUTION   HCT 65.2 (*) 39.0 - 52.0 %   Comment: RESULTS CONFIRMED BY MANUAL DILUTION   MCV 73.3 (*) 78.0 - 100.0 fL   MCH 20.0 (*) 26.0 - 34.0 pg   MCHC 27.3 (*) 30.0 - 36.0 g/dL   RDW 16.1 (*) 09.6 - 04.5 %   Platelets 392  150 - 400 K/uL   Neutrophils Relative % 93 (*) 43 - 77 %   Lymphocytes Relative 4 (*) 12 - 46 %   Monocytes Relative 3  3 - 12 %   Eosinophils Relative 0  0 - 5 %   Basophils Relative 0  0 - 1 %   Neutro Abs 46.4 (*) 1.7 - 7.7 K/uL   Lymphs Abs 2.0  0.7 - 4.0 K/uL   Monocytes Absolute 1.5 (*) 0.1 - 1.0 K/uL   Eosinophils Absolute 0.0  0.0 - 0.7 K/uL   Basophils Absolute 0.0  0.0 - 0.1 K/uL   RBC Morphology POLYCHROMASIA  PRESENT     Smear Review LARGE PLATELETS PRESENT    BASIC METABOLIC PANEL     Status: Abnormal   Collection Time    03/24/13 11:20 AM      Result Value Range   Sodium 130 (*) 135 - 145 mEq/L   Potassium 5.5 (*) 3.5 - 5.1 mEq/L   Chloride 79 (*) 96 - 112 mEq/L   CO2 19  19 - 32 mEq/L   Glucose, Bld 131 (*) 70 - 99 mg/dL   BUN 94 (*) 6 - 23 mg/dL   Creatinine, Ser 4.09 (*) 0.50 - 1.35 mg/dL   Calcium 81.1  8.4 - 91.4 mg/dL   GFR calc non Af Amer 19 (*) >90 mL/min   GFR calc Af Amer 22 (*) >90 mL/min   Comment: (NOTE)     The eGFR has been calculated using the CKD EPI equation.     This calculation has not been validated in all clinical situations.     eGFR's persistently <90 mL/min signify possible Chronic Kidney     Disease.  CG4 I-STAT (LACTIC ACID)  Status: Abnormal   Collection Time    03/24/13 12:25 PM      Result Value Range   Lactic Acid, Venous 6.41 (*) 0.5 - 2.2 mmol/L  LACTIC ACID, PLASMA     Status: Abnormal   Collection Time    03/24/13 12:59 PM      Result Value Range   Lactic Acid, Venous 2.9 (*) 0.5 - 2.2 mmol/L  CBC WITH DIFFERENTIAL     Status: Abnormal   Collection Time    03/24/13  1:00 PM      Result Value Range   WBC 53.4 (*) 4.0 - 10.5 K/uL   Comment: REPEATED TO VERIFY     CRITICAL RESULT CALLED TO, READ BACK BY AND VERIFIED WITH:     Posey Boyer (RN) [id number] 03/24/2013 L. LOMAX   RBC 7.86 (*) 4.22 - 5.81 MIL/uL   Hemoglobin 16.8  13.0 - 17.0 g/dL   HCT 04.5 (*) 40.9 - 81.1 %   MCV 72.4 (*) 78.0 - 100.0 fL   MCH 21.4 (*) 26.0 - 34.0 pg   MCHC 29.5 (*) 30.0 - 36.0 g/dL   RDW 91.4 (*) 78.2 - 95.6 %   Platelets 477 (*) 150 - 400 K/uL   Comment: PLATELET COUNT CONFIRMED BY SMEAR     LARGE PLATELETS PRESENT   Neutrophils Relative % 93 (*) 43 - 77 %   Neutro Abs 49.6 (*) 1.7 - 7.7 K/uL   Lymphocytes Relative 6 (*) 12 - 46 %   Lymphs Abs 3.0  0.7 - 4.0 K/uL   Monocytes Relative 1 (*) 3 - 12 %   Monocytes Absolute 0.7  0.1 - 1.0 K/uL   Eosinophils  Relative 0  0 - 5 %   Eosinophils Absolute 0.1  0.0 - 0.7 K/uL   Basophils Relative 0  0 - 1 %   Basophils Absolute 0.1  0.0 - 0.1 K/uL   RBC Morphology POLYCHROMASIA PRESENT     Smear Review LARGE PLATELETS PRESENT    URINALYSIS, ROUTINE W REFLEX MICROSCOPIC     Status: Abnormal   Collection Time    03/24/13  1:10 PM      Result Value Range   Color, Urine YELLOW  YELLOW   APPearance CLEAR  CLEAR   Specific Gravity, Urine 1.013  1.005 - 1.030   pH 5.0  5.0 - 8.0   Glucose, UA NEGATIVE  NEGATIVE mg/dL   Hgb urine dipstick NEGATIVE  NEGATIVE   Bilirubin Urine MODERATE (*) NEGATIVE   Ketones, ur 15 (*) NEGATIVE mg/dL   Protein, ur NEGATIVE  NEGATIVE mg/dL   Urobilinogen, UA 1.0  0.0 - 1.0 mg/dL   Nitrite NEGATIVE  NEGATIVE   Leukocytes, UA NEGATIVE  NEGATIVE   Comment: MICROSCOPIC NOT DONE ON URINES WITH NEGATIVE PROTEIN, BLOOD, LEUKOCYTES, NITRITE, OR GLUCOSE <1000 mg/dL.  GLUCOSE, CAPILLARY     Status: Abnormal   Collection Time    03/24/13  4:47 PM      Result Value Range   Glucose-Capillary 124 (*) 70 - 99 mg/dL  CBC     Status: Abnormal   Collection Time    03/24/13  6:40 PM      Result Value Range   WBC 49.9 (*) 4.0 - 10.5 K/uL   RBC 7.96 (*) 4.22 - 5.81 MIL/uL   Hemoglobin 16.5  13.0 - 17.0 g/dL   HCT 21.3 (*) 08.6 - 57.8 %   MCV 71.6 (*) 78.0 - 100.0 fL   MCH 20.7 (*)  26.0 - 34.0 pg   MCHC 28.9 (*) 30.0 - 36.0 g/dL   RDW 16.1 (*) 09.6 - 04.5 %   Platelets 385  150 - 400 K/uL  CREATININE, SERUM     Status: Abnormal   Collection Time    03/24/13  6:40 PM      Result Value Range   Creatinine, Ser 2.89 (*) 0.50 - 1.35 mg/dL   GFR calc non Af Amer 20 (*) >90 mL/min   GFR calc Af Amer 23 (*) >90 mL/min   Comment: (NOTE)     The eGFR has been calculated using the CKD EPI equation.     This calculation has not been validated in all clinical situations.     eGFR's persistently <90 mL/min signify possible Chronic Kidney     Disease.  PROTIME-INR     Status: None    Collection Time    03/24/13  6:40 PM      Result Value Range   Prothrombin Time 14.1  11.6 - 15.2 seconds   INR 1.11  0.00 - 1.49  HEMOGLOBIN A1C     Status: None   Collection Time    03/24/13  6:40 PM      Result Value Range   Hemoglobin A1C 5.6  <5.7 %   Comment: (NOTE)                                                                               According to the ADA Clinical Practice Recommendations for 2011, when     HbA1c is used as a screening test:      >=6.5%   Diagnostic of Diabetes Mellitus               (if abnormal result is confirmed)     5.7-6.4%   Increased risk of developing Diabetes Mellitus     References:Diagnosis and Classification of Diabetes Mellitus,Diabetes     Care,2011,34(Suppl 1):S62-S69 and Standards of Medical Care in             Diabetes - 2011,Diabetes Care,2011,34 (Suppl 1):S11-S61.   Mean Plasma Glucose 114  <117 mg/dL   Comment: Performed at [facility]  GLUCOSE, CAPILLARY     Status: Abnormal   Collection Time    03/24/13  8:43 PM      Result Value Range   Glucose-Capillary 156 (*) 70 - 99 mg/dL  GLUCOSE, CAPILLARY     Status: None   Collection Time    03/25/13 12:14 AM      Result Value Range   Glucose-Capillary 87  70 - 99 mg/dL  GLUCOSE, CAPILLARY     Status: None   Collection Time    03/25/13  4:13 AM      Result Value Range   Glucose-Capillary 80  70 - 99 mg/dL  COMPREHENSIVE METABOLIC PANEL     Status: Abnormal   Collection Time    03/25/13  5:30 AM      Result Value Range   Sodium 134 (*) 135 - 145 mEq/L   Potassium 3.6  3.5 - 5.1 mEq/L   Chloride 88 (*) 96 - 112 mEq/L   CO2 23  19 - 32 mEq/L  Glucose, Bld 52 (*) 70 - 99 mg/dL   BUN 87 (*) 6 - 23 mg/dL   Creatinine, Ser 8.46 (*) 0.50 - 1.35 mg/dL   Calcium 8.6  8.4 - 96.2 mg/dL   Total Protein 5.7 (*) 6.0 - 8.3 g/dL   Albumin 3.6  3.5 - 5.2 g/dL   AST 23  0 - 37 U/L   ALT 12  0 - 53 U/L   Alkaline Phosphatase 141 (*) 39 - 117 U/L   Total Bilirubin 1.9 (*) 0.3 -  1.2 mg/dL   GFR calc non Af Amer 21 (*) >90 mL/min   GFR calc Af Amer 25 (*) >90 mL/min   Comment: (NOTE)     The eGFR has been calculated using the CKD EPI equation.     This calculation has not been validated in all clinical situations.     eGFR's persistently <90 mL/min signify possible Chronic Kidney     Disease.  CBC     Status: Abnormal   Collection Time    03/25/13  5:30 AM      Result Value Range   WBC 43.7 (*) 4.0 - 10.5 K/uL   RBC 7.56 (*) 4.22 - 5.81 MIL/uL   Hemoglobin 15.7  13.0 - 17.0 g/dL   HCT 95.2 (*) 84.1 - 32.4 %   MCV 71.4 (*) 78.0 - 100.0 fL   MCH 20.8 (*) 26.0 - 34.0 pg   MCHC 29.1 (*) 30.0 - 36.0 g/dL   RDW 40.1 (*) 02.7 - 25.3 %   Platelets 391  150 - 400 K/uL  GLUCOSE, CAPILLARY     Status: None   Collection Time    03/25/13  8:51 AM      Result Value Range   Glucose-Capillary 91  70 - 99 mg/dL   Ct Abdomen Pelvis Wo Contrast  03/24/2013   CLINICAL DATA:  Back pain  EXAM: CT ABDOMEN AND PELVIS WITHOUT CONTRAST  TECHNIQUE: Multidetector CT imaging of the abdomen and pelvis was performed following the standard protocol without intravenous contrast.  COMPARISON:  None.  FINDINGS: Sagittal images of the spine shows no destructive bony lesions. Mild disc space flattening with mild anterior spurring at L2-L3 level.  The study is markedly limited without IV contrast. Lung bases are unremarkable. There is heterogeneous low density lesion posterior aspect of the right hepatic lobe measures at least 6 cm. This is highly suspicious for primary or secondary malignancy. Further correlation with enhanced study is recommended. Extensive atherosclerotic calcifications abdominal aorta, bilateral proximal renal artery, SMA, splenic artery and iliac arteries are noted. No aortic aneurysm. Probable stent in right renal artery.  There is right adrenal lesion measures 6.7 by 3.7 cm. This is highly suspicious for primary or secondary malignancy. Further correlation with enhanced MRI is  recommended.  The patient is status post cholecystectomy. A 2nd low-density lesion within right hepatic lobe anteriorly measures 2 cm. Unenhanced spleen is unremarkable. Mild pancreatic atrophy. The left adrenal gland is unremarkable.  Kidneys shows bilateral cortical thinning. No nephrolithiasis. No hydronephrosis or hydroureter. No small bowel obstruction. Mild distended urinary bladder. Prostate gland measures 3.7 x 4.2 cm. Atherosclerotic calcifications of femoral arteries. No calcified calculi are noted within urinary bladder. No ureteral calculi are identified.  IMPRESSION: 1. There is low density lesion posterior aspect of right hepatic lobe measures 6.3 cm highly suspicious for primary or secondary malignancy. Further assessment with enhanced study is recommended. There is right adrenal lesion measures 6.7 x 3.7 cm. Primary or  secondary malignancy is suspected. Further evaluation is recommended. 2. Extensive atherosclerotic vascular calcifications. 3. No hydronephrosis or hydroureter. 4. Mild distended urinary bladder. These results were called by telephone at the time of interpretation on 03/24/2013 at 2:34 PM to Dr. Blane Ohara , who verbally acknowledged these results. Mild enlarged prostate gland.   Electronically Signed   By: Natasha Mead M.D.   On: 03/24/2013 14:35   Ct Chest Wo Contrast  03/24/2013   CLINICAL DATA:  Abnormal finding on the abdomen and pelvis CT. A low-density liver lesion suggests a primary versus metastatic neoplasm. Large adrenal mass on the right also evident on the CT. Patient has no chest complaints.  EXAM: CT CHEST WITHOUT CONTRAST  TECHNIQUE: Multidetector CT imaging of the chest was performed following the standard protocol without IV contrast.  COMPARISON:  Abdomen and pelvis CT, 03/24/2013  FINDINGS: There are multiple bilateral calcified granulomas. There is no lung nodule or mass to suggest a primary lung malignancy as the source for the liver and adrenal lesions.  There are changes of moderate centrilobular emphysema. Minor subsegmental atelectasis is noted at the bases. There is no consolidation or edema. No pleural effusion or pneumothorax is seen.  The heart is mildly enlarged. There are dense coronary artery calcifications. No mediastinal or hilar masses or pathologically enlarged lymph nodes are appreciated.  There are no osteoblastic or osteolytic lesions.  IMPRESSION: 1. No lung mass or significant nodule is seen to suggest a primary lung malignancy as a source of the liver or adrenal lesions. There is no evidence of lung metastatic disease. 2. Multiple bilateral small calcified lung granulomas are noted. 3. There are changes of emphysema, but no acute findings in the lungs. 4. No mediastinal or hilar masses or enlarged lymph nodes.   Electronically Signed   By: Amie Portland M.D.   On: 03/24/2013 20:16    Assessment/Plan Rt adrenal and hepatic mass Elevated WBC-CLL Discussed biopsy of liver lesion. Explained procedure, risks, complications, use of sedation. Labs reviewed, coags ok. Heparin has been held and pt NPO. Consent signed in chart  Brayton El PA-C 03/25/2013, 9:08 AM

## 2013-03-25 NOTE — Progress Notes (Signed)
Pt states pain is not well controlled. Overnight MD changed medication from dilaudid to morphine, pt is requesting dilaudid. MD Blake Divine notified. Will continue to monitor.

## 2013-03-26 LAB — GLUCOSE, CAPILLARY
Glucose-Capillary: 115 mg/dL — ABNORMAL HIGH (ref 70–99)
Glucose-Capillary: 124 mg/dL — ABNORMAL HIGH (ref 70–99)
Glucose-Capillary: 154 mg/dL — ABNORMAL HIGH (ref 70–99)

## 2013-03-26 LAB — AFP TUMOR MARKER: AFP-Tumor Marker: 1614.6 ng/mL — ABNORMAL HIGH (ref 0.0–8.0)

## 2013-03-26 LAB — HEPATITIS B SURFACE ANTIGEN: Hepatitis B Surface Ag: NEGATIVE

## 2013-03-26 LAB — HEPATITIS B SURFACE ANTIBODY,QUALITATIVE: Hep B S Ab: NEGATIVE

## 2013-03-26 MED ORDER — HYDROMORPHONE HCL PF 1 MG/ML IJ SOLN
1.0000 mg | INTRAMUSCULAR | Status: DC | PRN
  Administered 2013-03-26 – 2013-03-27 (×6): 1 mg via INTRAVENOUS
  Filled 2013-03-26 (×7): qty 1

## 2013-03-26 NOTE — Progress Notes (Signed)
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 He relates his narcotics have been helping with the pain. Recently the pain medication has lost its effect. He relates he's had intermittent episodes like this 2 months prior to admission but didn't pay much attention. He relates some night sweats and unintentional 30 pound weight loss in about 2 months. A CT scan of the abdomen revealed a liver mass and he is undergoing a biopsy this afternoon.   Assessment/Plan: Back pain/Liver mass/unintentional weight loss:  - Consult IR for biopsy. CT chest did not reveal any lung nodules or masses to suggest a primary lung malignancy.  - Increase his home dose of narcotics and start him on something IV to better control the back pain.  - Zofran for nausea.  - Will let Dr Myrle Sheng know about patient's admission.  - get alpha fetoprotein, .  - gi consulted. Hyperkalemia:  - resolved after fluid hydration .  - Most likely due to decreased intravascular volume, EKG was done that showed no peak T waves.   Hyponatremia:  - improved - This most likely seems do to decreased intravascular volume.  - We'll go ahead and start him on IV fluids for the next 12 hours repeat a basic metabolic panel in the morning showed improvement.   Acute kidney injury/ CKD (chronic kidney disease) stage 3, GFR 30-59 ml/min:  - Most likely prerenal ischemia, his mucous membranes are dry. His urine specific gravity is 1013, at home he is on Lasix and hydrochlorothiazide. We'll have stopped these. Baseline creatinine is between 1.8 to 2. And his creatinine on admission is  2.8.  - We'll start him on IV fluids, monitor strict I.'s and O.'s.  A-fib:  - Currently rate controlled unknown reason why he is not on anticoagulation. He is not the best of historians.  - Hold aspirin.  HTN (hypertension):  - Continue beta blocker hold diuretics.  - Seems to be stable.  DM2 (diabetes mellitus, type 2)  - Stop 70/30s, start Lantus half dose, HGBA1C is 5.6, start sliding scale insulin. CBG (last 3)   Recent Labs  03/26/13 0738 03/26/13 1129 03/26/13 1618  GLUCAP 115* 124* 154*    Hypotension: - unclear etiology, probably from dehydration. Received fluid boluses and his bp parameters improved. He is currently asymptomatic.   Leukocytosis; - probably from the CLL.    DVT prophylaxis - SCD'S   Code Status: full code Family Communication: family at bedside, discussed the plan of care with the patient and family at bedside Disposition Plan: admitted to inpatient.    Consultants:  IR  Procedures:  Liver biopsy.   Antibiotics:  NONE  HPI/Subjective: Teary eyed and anxious about the liver biopsy results.   Objective: Filed Vitals:   03/26/13 1737  BP: 130/71  Pulse: 55  Temp: 98.2 F (36.8 C)  Resp: 16    Intake/Output Summary (Last 24 hours) at 03/26/13 1803 Last data filed at 03/26/13 1801  Gross per 24 hour  Intake    870 ml  Output   1200 ml  Net   -330 ml   Filed Weights   03/24/13 2044 03/25/13 2101  Weight: 58.559  kg (129 lb 1.6 oz) 60.056 kg (132 lb 6.4 oz)    Exam:   General:  Alert afebrile comfortable  Cardiovascular: s1s2  Respiratory: ctab  Abdomen: soft TENDER in the RUQ bs+  Musculoskeletal: left BKA  Data Reviewed: Basic Metabolic Panel:  Recent Labs Lab 03/24/13 1120 03/24/13 1840 03/25/13 0530  NA 130*  --  134*  K 5.5*  --  3.6  CL 79*  --  88*  CO2 19  --  23  GLUCOSE 131*  --  52*  BUN 94*  --  87*  CREATININE 2.96* 2.89* 2.75*  CALCIUM 10.0  --  8.6   Liver Function Tests:  Recent  Labs Lab 03/25/13 0530  AST 23  ALT 12  ALKPHOS 141*  BILITOT 1.9*  PROT 5.7*  ALBUMIN 3.6   No results found for this basename: LIPASE, AMYLASE,  in the last 168 hours No results found for this basename: AMMONIA,  in the last 168 hours CBC:  Recent Labs Lab 03/24/13 1120 03/24/13 1300 03/24/13 1840 03/25/13 0530  WBC 49.9* 53.4* 49.9* 43.7*  NEUTROABS 46.4* 49.6*  --   --   HGB 17.8* 16.8 16.5 15.7  HCT 65.2* 56.9* 57.0* 54.0*  MCV 73.3* 72.4* 71.6* 71.4*  PLT 392 477* 385 391   Cardiac Enzymes: No results found for this basename: CKTOTAL, CKMB, CKMBINDEX, TROPONINI,  in the last 168 hours BNP (last 3 results) No results found for this basename: PROBNP,  in the last 8760 hours CBG:  Recent Labs Lab 03/25/13 1742 03/25/13 2055 03/26/13 0738 03/26/13 1129 03/26/13 1618  GLUCAP 91 146* 115* 124* 154*    No results found for this or any previous visit (from the past 240 hour(s)).   Studies: Ct Chest Wo Contrast  03/24/2013   CLINICAL DATA:  Abnormal finding on the abdomen and pelvis CT. A low-density liver lesion suggests a primary versus metastatic neoplasm. Large adrenal mass on the right also evident on the CT. Patient has no chest complaints.  EXAM: CT CHEST WITHOUT CONTRAST  TECHNIQUE: Multidetector CT imaging of the chest was performed following the standard protocol without IV contrast.  COMPARISON:  Abdomen and pelvis CT, 03/24/2013  FINDINGS: There are multiple bilateral calcified granulomas. There is no lung nodule or mass to suggest a primary lung malignancy as the source for the liver and adrenal lesions. There are changes of moderate centrilobular emphysema. Minor subsegmental atelectasis is noted at the bases. There is no consolidation or edema. No pleural effusion or pneumothorax is seen.  The heart is mildly enlarged. There are dense coronary artery calcifications. No mediastinal or hilar masses or pathologically enlarged lymph nodes are appreciated.   There are no osteoblastic or osteolytic lesions.  IMPRESSION: 1. No lung mass or significant nodule is seen to suggest a primary lung malignancy as a source of the liver or adrenal lesions. There is no evidence of lung metastatic disease. 2. Multiple bilateral small calcified lung granulomas are noted. 3. There are changes of emphysema, but no acute findings in the lungs. 4. No mediastinal or hilar masses or enlarged lymph nodes.   Electronically Signed   By: Amie Portland M.D.   On: 03/24/2013 20:16   US Biopsy  03/25/2013   CLINICAL DATA:  Large right liver mass, adrenal mass, unknown primary  EXAM: ULTRASOUND GUIDED CORE BIOPSY OF RIGHT LIVER MASS  MEDICATIONS: 2 mg IV Versed; 100 mcg IV Fentanyl  Total Moderate Sedation Time: 15 MIN  PROCEDURE: The procedure,  risks, benefits, and alternatives were explained to the patient. Questions regarding the procedure were encouraged and answered. The patient understands and consents to the procedure.  The right upper quadrant was prepped with Betadine in a sterile fashion, and a sterile drape was applied covering the operative field. A sterile gown and sterile gloves were used for the procedure. Local anesthesia was provided with 1% Lidocaine.  Previous imaging reviewed. Preliminary ultrasound performed of the right upper quadrant. Large right liver mass demonstrated. Under sterile conditions and local anesthesia, a 17 gauge 6.8 cm access needle was advanced percutaneously into the liver lesion. Needle position confirmed with ultrasound. Images obtained for documentation. 2 18 gauge 3 cm core biopsies obtained. These were placed in formalin. Needle tract embolized with Gel-Foam. No immediate complication. Patient tolerated the procedure well.  COMPLICATIONS: None.  FINDINGS: Imaging confirms needle placement in the large right liver mass.  IMPRESSION: Successful ultrasound large right liver mass 18 gauge core biopsy   Electronically Signed   By: Ruel Favors M.D.    On: 03/25/2013 14:36    Scheduled Meds: . acetaminophen  1,000 mg Oral Once  . allopurinol  100 mg Oral Daily  . heparin  5,000 Units Subcutaneous Q8H  . insulin aspart  0-9 Units Subcutaneous TID WC  . insulin detemir  45 Units Subcutaneous QHS  . tamsulosin  0.8 mg Oral Daily   Continuous Infusions:   Principal Problem:   Liver mass Active Problems:   A-fib   HTN (hypertension)   DM2 (diabetes mellitus, type 2)   CKD (chronic kidney disease) stage 3, GFR 30-59 ml/min   Hyponatremia   AKI (acute kidney injury)    Time spent: 25  minutes    Kieron Kantner  [facility] Pager [phone]. If 7PM-7AM, please contact night-coverage at [email or web address], password [facility] 03/26/2013, 6:03 PM  LOS: 2 days

## 2013-03-27 DIAGNOSIS — I4891 Unspecified atrial fibrillation: Secondary | ICD-10-CM

## 2013-03-27 LAB — GLUCOSE, CAPILLARY
Glucose-Capillary: 109 mg/dL — ABNORMAL HIGH (ref 70–99)
Glucose-Capillary: 123 mg/dL — ABNORMAL HIGH (ref 70–99)
Glucose-Capillary: 108 mg/dL — ABNORMAL HIGH (ref 70–99)

## 2013-03-27 MED ORDER — HYDROMORPHONE HCL 2 MG PO TABS
1.0000 mg | ORAL_TABLET | ORAL | Status: DC | PRN
  Administered 2013-03-27 – 2013-03-29 (×12): 2 mg via ORAL
  Filled 2013-03-27 (×13): qty 1

## 2013-03-27 NOTE — Progress Notes (Signed)
CSW spoke with pt's spouse about discharge planning.  Pt's spouse reports she wants pt to return home and feels she can provide 24hour care for him.  She reports having equipment (hospital bed, walkers, etc.).  CSW instructed spouse to let RN or CSW know if she starts to feel she cannot provide for pt and we can look into placement options.  CSW signing off.  Please reconult CSW if further needs arise.    [phone] (weekend CSW)

## 2013-03-27 NOTE — Progress Notes (Signed)
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 He relates his narcotics have been helping with the pain. Recently the pain medication has lost its effect. He relates he's had intermittent episodes like this 2 months prior to admission but didn't pay much attention. He relates some night sweats and unintentional 30 pound weight loss in about 2 months. A CT scan of the abdomen revealed a liver mass and he underwent biopsy of the liver on 03/25/13. GI consulted. AFP came back elevated and hepatitis serology pending. Pt abdominal pain is still not well controlled. We have increased his dilaudid to 2 mg every 3 hours.   Assessment/Plan: Back pain/Liver mass/unintentional weight loss:  - Consulted IR for biopsy. CT chest did not reveal any lung nodules or masses to suggest a primary lung malignancy.  - Increase his home dose of narcotics and started him on something IV to better control the back pain.  - Zofran for nausea.  - Will let Dr Myrle Sheng know about patient's admission.  - AFP is elevated - gi consulted and hepatitis serology negative so far.  Hyperkalemia:  - resolved after fluid hydration .  - Most likely due to decreased intravascular volume, EKG was done that showed no peak T waves.   Hyponatremia:  - improved - This most likely seems do to decreased intravascular volume.  - We started him on IV fluids for the next 12 hours repeat a basic metabolic panel in the morning showed improvement.   Acute kidney injury/ CKD (chronic kidney disease) stage 3, GFR 30-59 ml/min:  - Most likely prerenal ischemia, his mucous membranes  are dry. His urine specific gravity is 1013, at home he is on Lasix and hydrochlorothiazide. We'll have stopped these. Baseline creatinine is between 1.8 to 2. And his creatinine on admission is 2.8.  - We'll start him on IV fluids, monitor strict I.'s and O.'s.  A-fib:  - Currently rate controlled unknown reason why he is not on anticoagulation. He is not the best of historians.  - Holding aspirin for biopsy.  HTN (hypertension):  - Continue beta blocker hold diuretics.  - Seems to be stable.  DM2 (diabetes mellitus, type 2)  - Stop 70/30s, start Lantus half dose, HGBA1C is 5.6, start sliding scale insulin. CBG (last 3)   Recent Labs  03/26/13 2211 03/27/13 0742 03/27/13 1138  GLUCAP 129* 109* 156*    Hypotension: - unclear etiology, probably from dehydration. Received fluid boluses and his bp parameters improved. He is currently asymptomatic.   Leukocytosis; - probably from the CLL.    DVT prophylaxis - SCD'S   Code Status: full code Family Communication: family at bedside, discussed the plan of care with the patient and family at bedside Disposition Plan: admitted to inpatient.    Consultants:  IR  GI   Procedures:  Liver biopsy ON 12/12.   Antibiotics:  NONE  HPI/Subjective: Teary eyed and anxious about the liver biopsy results. HE Reports his abdominal pain is not well controlled, increased dilaudid.   Objective: Filed Vitals:   03/27/13 1000  BP: 128/63  Pulse: 69  Temp: 97.5 F (36.4 C)  Resp: 20    Intake/Output  Summary (Last 24 hours) at 03/27/13 1536 Last data filed at 03/27/13 0900  Gross per 24 hour  Intake    480 ml  Output    100 ml  Net    380 ml   Filed Weights   03/24/13 2044 03/25/13 2101 03/26/13 2226  Weight: 58.559 kg (129 lb 1.6 oz) 60.056 kg (132 lb 6.4 oz) 59.33 kg (130 lb 12.8 oz)    Exam:   General:  Alert afebrile comfortable  Cardiovascular: s1s2  Respiratory: ctab  Abdomen: soft TENDER in the RUQ  bs+  Musculoskeletal: left BKA  Data Reviewed: Basic Metabolic Panel:  Recent Labs Lab 03/24/13 1120 03/24/13 1840 03/25/13 0530  NA 130*  --  134*  K 5.5*  --  3.6  CL 79*  --  88*  CO2 19  --  23  GLUCOSE 131*  --  52*  BUN 94*  --  87*  CREATININE 2.96* 2.89* 2.75*  CALCIUM 10.0  --  8.6   Liver Function Tests:  Recent Labs Lab 03/25/13 0530  AST 23  ALT 12  ALKPHOS 141*  BILITOT 1.9*  PROT 5.7*  ALBUMIN 3.6   No results found for this basename: LIPASE, AMYLASE,  in the last 168 hours No results found for this basename: AMMONIA,  in the last 168 hours CBC:  Recent Labs Lab 03/24/13 1120 03/24/13 1300 03/24/13 1840 03/25/13 0530  WBC 49.9* 53.4* 49.9* 43.7*  NEUTROABS 46.4* 49.6*  --   --   HGB 17.8* 16.8 16.5 15.7  HCT 65.2* 56.9* 57.0* 54.0*  MCV 73.3* 72.4* 71.6* 71.4*  PLT 392 477* 385 391   Cardiac Enzymes: No results found for this basename: CKTOTAL, CKMB, CKMBINDEX, TROPONINI,  in the last 168 hours BNP (last 3 results) No results found for this basename: PROBNP,  in the last 8760 hours CBG:  Recent Labs Lab 03/26/13 1129 03/26/13 1618 03/26/13 2211 03/27/13 0742 03/27/13 1138  GLUCAP 124* 154* 129* 109* 156*    No results found for this or any previous visit (from the past 240 hour(s)).   Studies: No results found.  Scheduled Meds: . acetaminophen  1,000 mg Oral Once  . allopurinol  100 mg Oral Daily  . heparin  5,000 Units Subcutaneous Q8H  . insulin aspart  0-9 Units Subcutaneous TID WC  . insulin detemir  45 Units Subcutaneous QHS  . tamsulosin  0.8 mg Oral Daily   Continuous Infusions:   Principal Problem:   Liver mass Active Problems:   A-fib   HTN (hypertension)   DM2 (diabetes mellitus, type 2)   CKD (chronic kidney disease) stage 3, GFR 30-59 ml/min   Hyponatremia   AKI (acute kidney injury)    Time spent: 25  minutes    Carmen Vallecillo  [facility] Pager [phone]. If 7PM-7AM, please contact  night-coverage at [email or web address], password [facility] 03/27/2013, 3:36 PM  LOS: 3 days

## 2013-03-28 LAB — GLUCOSE, CAPILLARY
Glucose-Capillary: 120 mg/dL — ABNORMAL HIGH (ref 70–99)
Glucose-Capillary: 96 mg/dL (ref 70–99)

## 2013-03-28 LAB — CBC
HCT: 54.2 % — ABNORMAL HIGH (ref 39.0–52.0)
Hemoglobin: 15.2 g/dL (ref 13.0–17.0)
MCH: 20.4 pg — ABNORMAL LOW (ref 26.0–34.0)
MCHC: 28 g/dL — ABNORMAL LOW (ref 30.0–36.0)
RBC: 7.44 MIL/uL — ABNORMAL HIGH (ref 4.22–5.81)

## 2013-03-28 LAB — COMPREHENSIVE METABOLIC PANEL
ALT: 11 U/L (ref 0–53)
Alkaline Phosphatase: 123 U/L — ABNORMAL HIGH (ref 39–117)
BUN: 35 mg/dL — ABNORMAL HIGH (ref 6–23)
CO2: 28 mEq/L (ref 19–32)
Calcium: 9.1 mg/dL (ref 8.4–10.5)
GFR calc Af Amer: 48 mL/min — ABNORMAL LOW (ref >90)
GFR calc non Af Amer: 41 mL/min — ABNORMAL LOW (ref >90)
Glucose, Bld: 85 mg/dL (ref 70–99)
Potassium: 2.9 mEq/L — ABNORMAL LOW (ref 3.5–5.1)
Sodium: 143 mEq/L (ref 135–145)
Total Protein: 5.5 g/dL — ABNORMAL LOW (ref 6.0–8.3)

## 2013-03-28 MED ORDER — POTASSIUM CHLORIDE 10 MEQ/100ML IV SOLN
10.0000 meq | INTRAVENOUS | Status: AC
  Filled 2013-03-28 (×2): qty 100

## 2013-03-28 MED ORDER — POTASSIUM CHLORIDE CRYS ER 20 MEQ PO TBCR
40.0000 meq | EXTENDED_RELEASE_TABLET | Freq: Two times a day (BID) | ORAL | Status: AC
  Administered 2013-03-28 (×2): 40 meq via ORAL
  Filled 2013-03-28 (×2): qty 2

## 2013-03-28 MED ORDER — GLUCERNA SHAKE PO LIQD
237.0000 mL | ORAL | Status: DC
  Administered 2013-03-28 – 2013-03-30 (×3): 237 mL via ORAL
  Filled 2013-03-28: qty 237

## 2013-03-28 NOTE — Progress Notes (Signed)
PT Cancellation Note  Patient Details Name: Donald Dudley MRN: [id number] DOB: 05-13-1940   Cancelled Treatment:    Reason Eval/Treat Not Completed: Pain limiting ability to participate. Attempted to see pt x2; pt refusing both times secondary to pain. Will re-attempt at next available time. Spoke with pt and wife regarding D/C disposition. Pt is refusing SNF; plans to D/C home. Will need HHPT to increase strength and (A) with transfers.    [name] [place], [place] [phone] 03/28/2013, 2:31 PM

## 2013-03-28 NOTE — Progress Notes (Signed)
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 He relates his narcotics have been helping with the pain. Recently the pain medication has lost its effect. He relates he's had intermittent episodes like this 2 months prior to admission but didn't pay much attention. He relates some night sweats and unintentional 30 pound weight loss in about 2 months. A CT scan of the abdomen revealed a liver mass and he underwent biopsy of the liver on 03/25/13. GI consulted. AFP came back elevated and hepatitis serology pending. Pt abdominal pain is still not well controlled. We have increased his dilaudid to 2 mg every 3 hours.   Assessment/Plan: Back pain/Liver mass/unintentional weight loss:  - Consulted IR for biopsy. CT chest did not reveal any lung nodules or masses to suggest a primary lung malignancy.  - Increase his home dose of narcotics and started him on something IV to better control the back pain.  - Zofran for nausea.  - Will let Dr Myrle Sheng know about patient's admission.  - AFP is elevated - gi consulted and hepatitis serology negative so far.  Hyperkalemia:  - resolved after fluid hydration .  - Most likely due to decreased intravascular volume, EKG was done that showed no peak T waves.   Hyponatremia:  - improved - This most likely seems do to decreased intravascular volume.  - We started him on IV fluids for the next 12 hours repeat a basic metabolic panel in the morning showed improvement.   Acute kidney injury/ CKD (chronic kidney disease) stage 3, GFR 30-59 ml/min:  - Most likely prerenal ischemia, his mucous membranes  are dry. His urine specific gravity is 1013, at home he is on Lasix and hydrochlorothiazide. We'll have stopped these. Baseline creatinine is between 1.8 to 2. And his creatinine on admission is 2.8.  - We'll start him on IV fluids, monitor strict I.'s and O.'s.  A-fib:  - Currently rate controlled unknown reason why he is not on anticoagulation. He is not the best of historians.  - Holding aspirin for biopsy.  HTN (hypertension):  - Continue beta blocker hold diuretics.  - Seems to be stable.  DM2 (diabetes mellitus, type 2)  - Stop 70/30s, start Lantus half dose, HGBA1C is 5.6, start sliding scale insulin. CBG (last 3)   Recent Labs  03/28/13 0721 03/28/13 1130 03/28/13 1629  GLUCAP 103* 135* 120*    Hypotension: - unclear etiology, probably from dehydration. Received fluid boluses and his bp parameters improved. He is currently asymptomatic.   Leukocytosis; - probably from the CLL.    DVT prophylaxis - SCD'S   Code Status: full code Family Communication: family at bedside, discussed the plan of care with the patient and family at bedside Disposition Plan: admitted to inpatient.    Consultants:  IR  GI   Procedures:  Liver biopsy ON 12/12.   Antibiotics:  NONE  HPI/Subjective: Teary eyed and anxious about the liver biopsy results. HE Reports his abdominal pain is not well controlled, increased dilaudid.   Objective: Filed Vitals:   03/28/13 1342  BP: 120/62  Pulse: 72  Temp: 98 F (36.7 C)  Resp: 18    Intake/Output  Summary (Last 24 hours) at 03/28/13 1733 Last data filed at 03/28/13 1500  Gross per 24 hour  Intake    660 ml  Output    925 ml  Net   -265 ml   Filed Weights   03/24/13 2044 03/25/13 2101 03/26/13 2226  Weight: 58.559 kg (129 lb 1.6 oz) 60.056 kg (132 lb 6.4 oz) 59.33 kg (130 lb 12.8 oz)    Exam:   General:  Alert afebrile comfortable  Cardiovascular: s1s2  Respiratory: ctab  Abdomen: soft TENDER in the RUQ  bs+  Musculoskeletal: left BKA  Data Reviewed: Basic Metabolic Panel:  Recent Labs Lab 03/24/13 1120 03/24/13 1840 03/25/13 0530 03/28/13 0519  NA 130*  --  134* 143  K 5.5*  --  3.6 2.9*  CL 79*  --  88* 98  CO2 19  --  23 28  GLUCOSE 131*  --  52* 85  BUN 94*  --  87* 35*  CREATININE 2.96* 2.89* 2.75* 1.59*  CALCIUM 10.0  --  8.6 9.1  MG  --   --   --  2.0   Liver Function Tests:  Recent Labs Lab 03/25/13 0530 03/28/13 0519  AST 23 18  ALT 12 11  ALKPHOS 141* 123*  BILITOT 1.9* 1.9*  PROT 5.7* 5.5*  ALBUMIN 3.6 3.1*   No results found for this basename: LIPASE, AMYLASE,  in the last 168 hours No results found for this basename: AMMONIA,  in the last 168 hours CBC:  Recent Labs Lab 03/24/13 1120 03/24/13 1300 03/24/13 1840 03/25/13 0530 03/28/13 0519  WBC 49.9* 53.4* 49.9* 43.7* 29.9*  NEUTROABS 46.4* 49.6*  --   --   --   HGB 17.8* 16.8 16.5 15.7 15.2  HCT 65.2* 56.9* 57.0* 54.0* 54.2*  MCV 73.3* 72.4* 71.6* 71.4* 72.8*  PLT 392 477* 385 391 284   Cardiac Enzymes: No results found for this basename: CKTOTAL, CKMB, CKMBINDEX, TROPONINI,  in the last 168 hours BNP (last 3 results) No results found for this basename: PROBNP,  in the last 8760 hours CBG:  Recent Labs Lab 03/27/13 1643 03/27/13 2147 03/28/13 0721 03/28/13 1130 03/28/13 1629  GLUCAP 123* 108* 103* 135* 120*    No results found for this or any previous visit (from the past 240 hour(s)).   Studies: No results found.  Scheduled Meds: . acetaminophen  1,000 mg Oral Once  . allopurinol  100 mg Oral Daily  . feeding supplement (GLUCERNA SHAKE)  237 mL Oral Q24H  . heparin  5,000 Units Subcutaneous Q8H  . insulin aspart  0-9 Units Subcutaneous TID WC  . insulin detemir  45 Units Subcutaneous QHS  . potassium chloride  40 mEq Oral BID  . tamsulosin  0.8 mg Oral Daily   Continuous Infusions:   Principal Problem:   Liver mass Active Problems:   A-fib   HTN (hypertension)    DM2 (diabetes mellitus, type 2)   CKD (chronic kidney disease) stage 3, GFR 30-59 ml/min   Hyponatremia   AKI (acute kidney injury)    Time spent: 25  minutes    Delila Kuklinski  [facility] Pager [phone]. If 7PM-7AM, please contact night-coverage at [email or web address], password [facility] 03/28/2013, 5:33 PM  LOS: 4 days

## 2013-03-28 NOTE — Progress Notes (Signed)
NUTRITION FOLLOW-UP  DOCUMENTATION CODES Per approved criteria  -Severe malnutrition in the context of chronic illness   INTERVENTION: Add Glucerna Shake po daily. RD to continue to follow nutrition care plan.  NUTRITION DIAGNOSIS: Inadequate oral intake related to GI distress as evidenced by severe weight loss and poor intake. Improving.  Goal: Intake to meet >90% of estimated nutrition needs.  Monitor:  weight trends, lab trends, I/O's, diet advancement  ASSESSMENT: PMHx significant for CLL s/p L BKA, PVD, CKD stage III, HF. Admitted with R-sided back pain and abdominal pain x 1 week. He also notes that he has had 30-lb unintentional wt loss x 2 months and night sweats. Work-up reveals large liver lesion and R adrenal mass.  Underwent liver mass biopsy on 12/12. Currently awaiting bx results.  Currently ordered for Regular diet. Eating 75%. Refusing therapy today 2/2 pain.  Potassium is low at 2.9 and trending down.  Pt meets criteria for severe MALNUTRITION in the context of chronic illness as evidenced by 19% wt loss x 2 months, severe fat and muscle mass loss and intake of <75% of estimated energy intake x at least 1 month.   Height: Ht Readings from Last 1 Encounters:  03/25/13 5\' 8"  (1.727 m)    Weight: Wt Readings from Last 1 Encounters:  03/26/13 130 lb 12.8 oz (59.33 kg)  Admit wt 129 lb - stable  BMI:  Body mass index is 19.89 kg/(m^2). Normal  Estimated Nutritional Needs: Kcal: 1750 - 2000 Protein: at least 75 grams daily Fluid: 1.8 - 2 liters  Skin: abrasions and incisions  Diet Order: General  EDUCATION NEEDS: -No education needs identified at this time   Intake/Output Summary (Last 24 hours) at 03/28/13 1417 Last data filed at 03/28/13 1252  Gross per 24 hour  Intake    660 ml  Output    900 ml  Net   -240 ml    Last BM: 12/12  Labs:   Recent Labs Lab 03/24/13 1120 03/24/13 1840 03/25/13 0530 03/28/13 0519  NA 130*  --  134*  143  K 5.5*  --  3.6 2.9*  CL 79*  --  88* 98  CO2 19  --  23 28  BUN 94*  --  87* 35*  CREATININE 2.96* 2.89* 2.75* 1.59*  CALCIUM 10.0  --  8.6 9.1  MG  --   --   --  2.0  GLUCOSE 131*  --  52* 85    CBG (last 3)   Recent Labs  03/27/13 2147 03/28/13 0721 03/28/13 1130  GLUCAP 108* 103* 135*    Scheduled Meds: . acetaminophen  1,000 mg Oral Once  . allopurinol  100 mg Oral Daily  . heparin  5,000 Units Subcutaneous Q8H  . insulin aspart  0-9 Units Subcutaneous TID WC  . insulin detemir  45 Units Subcutaneous QHS  . potassium chloride  40 mEq Oral BID  . tamsulosin  0.8 mg Oral Daily    Continuous Infusions:  none   Jarold Motto MS, RD, LDN Pager: [phone] After-hours pager: [phone]

## 2013-03-28 NOTE — Progress Notes (Signed)
Subjective: Feeling well.  Some discomfort on the right side.  Objective: Vital signs in last 24 hours: Temp:  [97.5 F (36.4 C)-97.9 F (36.6 C)] 97.7 F (36.5 C) (12/15 0550) Pulse Rate:  [69-89] 84 (12/15 0550) Resp:  [17-20] 17 (12/15 0550) BP: (122-128)/(54-63) 123/60 mmHg (12/15 0550) SpO2:  [97 %-99 %] 97 % (12/15 0550) Last BM Date: 03/25/13  Intake/Output from previous day: 12/14 0701 - 12/15 0700 In: 420 [P.O.:420] Out: 600 [Urine:600] Intake/Output this shift:    General appearance: alert and no distress GI: tender on the right side  Lab Results:  Recent Labs  03/28/13 0519  WBC 29.9*  HGB 15.2  HCT 54.2*  PLT 284   BMET No results found for this basename: NA, K, CL, CO2, GLUCOSE, BUN, CREATININE, CALCIUM,  in the last 72 hours LFT No results found for this basename: PROT, ALBUMIN, AST, ALT, ALKPHOS, BILITOT, BILIDIR, IBILI,  in the last 72 hours PT/INR No results found for this basename: LABPROT, INR,  in the last 72 hours Hepatitis Panel  Recent Labs  03/26/13 0610  HEPBSAG NEGATIVE  HCVAB NEGATIVE   C-Diff No results found for this basename: CDIFFTOX,  in the last 72 hours Fecal Lactopherrin No results found for this basename: FECLLACTOFRN,  in the last 72 hours  Studies/Results: No results found.  Medications:  Scheduled: . acetaminophen  1,000 mg Oral Once  . allopurinol  100 mg Oral Daily  . heparin  5,000 Units Subcutaneous Q8H  . insulin aspart  0-9 Units Subcutaneous TID WC  . insulin detemir  45 Units Subcutaneous QHS  . tamsulosin  0.8 mg Oral Daily   Continuous:   Assessment/Plan: 1) Right hepatic lobe mass.  Plan: 1) Await biopsy results.   LOS: 4 days   Mirza Kidney D 03/28/2013, 7:27 AM

## 2013-03-29 DIAGNOSIS — R1909 Other intra-abdominal and pelvic swelling, mass and lump: Secondary | ICD-10-CM

## 2013-03-29 DIAGNOSIS — D47Z9 Other specified neoplasms of uncertain behavior of lymphoid, hematopoietic and related tissue: Secondary | ICD-10-CM

## 2013-03-29 LAB — GLUCOSE, CAPILLARY
Glucose-Capillary: 19 mg/dL — CL (ref 70–99)
Glucose-Capillary: 63 mg/dL — ABNORMAL LOW (ref 70–99)
Glucose-Capillary: 59 mg/dL — ABNORMAL LOW (ref 70–99)
Glucose-Capillary: 88 mg/dL (ref 70–99)

## 2013-03-29 LAB — BASIC METABOLIC PANEL
BUN: 28 mg/dL — ABNORMAL HIGH (ref 6–23)
BUN: 28 mg/dL — ABNORMAL HIGH (ref 6–23)
CO2: 30 mEq/L (ref 19–32)
CO2: 26 mEq/L (ref 19–32)
Chloride: 101 mEq/L (ref 96–112)
Chloride: 100 mEq/L (ref 96–112)
Creatinine, Ser: 1.37 mg/dL — ABNORMAL HIGH (ref 0.50–1.35)
GFR calc Af Amer: 60 mL/min — ABNORMAL LOW (ref >90)
GFR calc non Af Amer: 52 mL/min — ABNORMAL LOW (ref >90)
Glucose, Bld: 130 mg/dL — ABNORMAL HIGH (ref 70–99)
Glucose, Bld: 93 mg/dL (ref 70–99)
Potassium: 4.6 mEq/L (ref 3.5–5.1)
Sodium: 140 mEq/L (ref 135–145)
Sodium: 136 mEq/L (ref 135–145)

## 2013-03-29 MED ORDER — DEXTROSE 50 % IV SOLN
INTRAVENOUS | Status: AC
  Administered 2013-03-29: 50 mL
  Filled 2013-03-29: qty 50

## 2013-03-29 MED ORDER — HYDROMORPHONE HCL PF 1 MG/ML IJ SOLN
1.0000 mg | INTRAMUSCULAR | Status: DC | PRN
  Administered 2013-03-29 – 2013-03-31 (×10): 2 mg via INTRAVENOUS
  Administered 2013-03-31: 1 mg via INTRAVENOUS
  Administered 2013-03-31: 2 mg via INTRAVENOUS
  Filled 2013-03-29 (×5): qty 2
  Filled 2013-03-29: qty 1
  Filled 2013-03-29 (×6): qty 2

## 2013-03-29 MED ORDER — POTASSIUM CHLORIDE CRYS ER 20 MEQ PO TBCR
40.0000 meq | EXTENDED_RELEASE_TABLET | Freq: Two times a day (BID) | ORAL | Status: AC
  Administered 2013-03-29: 40 meq via ORAL
  Administered 2013-03-29: 20 meq via ORAL
  Filled 2013-03-29 (×2): qty 2

## 2013-03-29 MED ORDER — INSULIN DETEMIR 100 UNIT/ML ~~LOC~~ SOLN
22.0000 [IU] | Freq: Every day | SUBCUTANEOUS | Status: DC
  Filled 2013-03-29: qty 0.22

## 2013-03-29 MED ORDER — DEXTROSE 10 % IV SOLN
INTRAVENOUS | Status: DC
  Administered 2013-03-29 – 2013-03-30 (×2): via INTRAVENOUS

## 2013-03-29 MED ORDER — DEXTROSE 50 % IV SOLN
INTRAVENOUS | Status: AC
  Administered 2013-03-29: 10:00:00
  Filled 2013-03-29: qty 50

## 2013-03-29 MED ORDER — INSULIN ASPART 100 UNIT/ML ~~LOC~~ SOLN
0.0000 [IU] | SUBCUTANEOUS | Status: DC
  Administered 2013-03-30 – 2013-04-02 (×4): 1 [IU] via SUBCUTANEOUS

## 2013-03-29 MED ORDER — MORPHINE SULFATE ER 15 MG PO TBCR
30.0000 mg | EXTENDED_RELEASE_TABLET | Freq: Two times a day (BID) | ORAL | Status: DC
  Administered 2013-03-29 – 2013-03-30 (×3): 30 mg via ORAL
  Filled 2013-03-29 (×3): qty 2

## 2013-03-29 NOTE — Progress Notes (Signed)
Pathology pending.  The patient was sleeping this AM.  No new recommendations.  Await biopsy results.

## 2013-03-29 NOTE — Progress Notes (Signed)
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 He relates his narcotics have been helping with the pain. Recently the pain medication has lost its effect. He relates he's had intermittent episodes like this 2 months prior to admission but didn't pay much attention. He relates some night sweats and unintentional 30 pound weight loss in about 2 months. He relates he can't lay flat on his back as the pain is unbearable. It feels better when he is on his left side.   PT Comments   Pt wanted to move in bed due to increased pain and stiffness. RLE most painful due to ? Nerve compression. Encouraged pt to get OOB into chair for his meals and reports he has mostly tried to sit EOB, which he can only do for a short time due to increasing back pain. Discussed his discharge plans and pt wants to find out what Hospice can provide. PT will await results of Hospice meeting (?pt will be candidate for [facility] and ? If pt would be agreeable to this--currently he is still set on going home, even if he would be maintained at a bed level at home).   Follow Up Recommendations  Home health PT;Supervision for mobility/OOB     Does the patient have the potential to tolerate intense rehabilitation     Barriers to Discharge        Equipment Recommendations  None recommended by PT    Recommendations for Other Services    Frequency Min 3X/week   Progress towards PT Goals Progress towards PT goals: Progressing toward goals  Plan Current plan  remains appropriate    Precautions / Restrictions Precautions Precautions: Fall   Pertinent Vitals/Pain Rt heel 10/10 (burning); pt pre-medicated for pain prior to session.    Mobility  Bed Mobility Bed Mobility: Rolling Left;Scooting to [facility] Rolling Left: 6: Modified independent (Device/Increase time);With rail Scooting to [facility]: 5: Supervision;With rail;Other (comment) (trendelenburg) Details for Bed Mobility Assistance: pt declined sitting on EOB due to pain Transfers Transfers: Not assessed Ambulation/Gait Ambulation/Gait Assistance: Not tested (comment)    Exercises General Exercises - Upper Extremity Shoulder Flexion: AROM;Right;Left;5 reps;Sidelying Elbow Flexion: AROM;Both;10 reps;Sidelying Elbow Extension: AROM;Both;10 reps;Sidelying Digit Composite Flexion: AROM;Both;5 reps;Sidelying Composite Extension: AROM;Both;5 reps;Sidelying General Exercises - Lower Extremity Ankle Circles/Pumps: AROM;Right;5 reps;Sidelying Heel Slides: AAROM;Both;5 reps;Sidelying Other Exercises Other Exercises: All exercises done in sidelying as pt reports unable to tolerate lying supine due to back pain   PT Diagnosis:    PT Problem List:   PT Treatment Interventions:     PT Goals (current goals can now be found in the care plan section) Acute Rehab PT Goals Patient Stated Goal: decr pain and go home  Visit Information  Last PT Received On: 03/29/13 Assistance Needed: +1 History of Present Illness: 72 year old with past medical history of 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 He relates his  narcotics have been helping with the pain. Recently the pain medication has lost its effect. He relates he's had intermittent episodes like this 2 months prior to admission but didn't pay much attention. He  relates some night sweats and unintentional 30 pound weight loss in about 2 months. He relates he can't lay flat on his back as the pain is unbearable. It feels better when he is on his left side.    Subjective Data  Subjective: Pt reports he feels stiff and painful. Really wants to go home Patient Stated Goal: decr pain and go home   Cognition  Cognition Arousal/Alertness: Awake/alert Behavior During Therapy: WFL for tasks assessed/performed Overall Cognitive Status: Within Functional Limits for tasks assessed    Balance     End of Session PT - End of Session Activity Tolerance: Patient limited by pain Patient left: in bed;with call bell/phone within reach;with bed alarm set;with nursing/sitter in room   GP     Tania Perrott 03/29/2013, 5:54 PM Pager [phone]

## 2013-03-29 NOTE — Progress Notes (Addendum)
IP PROGRESS NOTE  Subjective:   [name] is well-known to me with a history of a myeloproliferative disorder. He reports right abdomen and flank pain for the past several weeks. He also has severe pain in the entire right leg. He has developed anorexia.  He was admitted on 03/24/2013. A CT revealed a right liver mass and a right adrenal mass. An ultrasound-guided biopsy of the liver was performed on 03/25/2013 and the pathology is pending.  He reports a remote history of alcohol use, he quit in the 1960s or 70s. No risk factor for hepatitis.  Objective: Vital signs in last 24 hours: Blood pressure 128/68, pulse 88, temperature 98.5 F (36.9 C), temperature source Oral, resp. rate 18, height 5\' 8"  (1.727 m), weight 135 lb 3.2 oz (61.326 kg), SpO2 100.00%.  Intake/Output from previous day: 12/15 0701 - 12/16 0700 In: 480 [P.O.:480] Out: 850 [Urine:850]  Physical Exam:  HEENT: Neck without mass Lungs: Clear anteriorly, distant breath sound Cardiac: Irregular Abdomen: No hepatosplenomegaly, no mass, nontender, no apparent ascites Extremities: No right leg edema Musculoskeletal: No tenderness at the right flank. No pain with motion at the right hip Lymph nodes: No cervical, supraclavicular, axillary, or inguinal nodes Neurologic: Lethargic, arousable, follows commands    Lab Results:  Recent Labs  03/28/13 0519  WBC 29.9*  HGB 15.2  HCT 54.2*  PLT 284    BMET  Recent Labs  03/28/13 0519 03/29/13 1002  NA 143 140  K 2.9* 3.1*  CL 98 101  CO2 28 30  GLUCOSE 85 130*  BUN 35* 28*  CREATININE 1.59* 1.37*  CALCIUM 9.1 9.2   03/25/2013-AFP  1615  Studies/Results: No results found.  Medications: I have reviewed the patient's current medications.  Assessment/Plan:  1. History of polycythemia vera  2. Chronic leukocytosis, neutrophilia, secondary to #1  3. Status post left BKA  4. Atrial fibrillation  5. History of "Sweet syndrome "  6. History of  shingles  7. History of sacral decubitus ulcers  8. Right liver mass and right retroperitoneal versus adrenal mass-status post an ultrasound-guided biopsy of the liver 03/25/2013 with the pathology pending  9. Pain-likely secondary to the right retroperitoneal mass,? Involvement of the spine or sciatic nerve  Donald Dudley has a complex medical history. He has a history of polycythemia vera and was treated with hydroxyurea in the past. He has chronic neutrophilia secondary to the myeloproliferative disorder.  Donald Dudley now presents with severe pain in the right flank and right leg. The pain appears to be related to a retroperitoneal mass,? Nerve compression. He could have tumor involving the spine or right pelvis/leg bones.  A biopsy of of the liver was obtained on 03/25/2013 and the pathology is pending. The AFP is markedly elevated. 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  Donald Dudley has a poor performance status. He will not be a candidate for surgery or systemic chemotherapy.  I will recommend hospice care if a diagnosis of hepatocellular carcinoma is confirmed.  Recommendations:  1. Narcotic analgesics for pain, add a long acting narcotic 2. Consider imaging of the spine and pelvis/right femur if the right leg pain persists 3. Followup pathology from the 03/25/2013 liver biopsy 4. Hospice consult and consider [facility] if a diagnosis of hepatocellular carcinoma is confirmed.  I will continue following Donald Dudley in the hospital. I will discuss the situation with his family when they are available in the pathology report is finalized.   LOS: 5  days   Keats Kingry, Jillyn Hidden  03/29/2013, 2:05 PM

## 2013-03-29 NOTE — Progress Notes (Signed)
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 He relates his narcotics have been helping with the pain. Recently the pain medication has lost its effect. He relates he's had intermittent episodes like this 2 months prior to admission but didn't pay much attention. He relates some night sweats and unintentional 30 pound weight loss in about 2 months. A CT scan of the abdomen revealed a liver mass and he underwent biopsy of the liver on 03/25/13. GI consulted. AFP came back elevated and hepatitis serology pending. Pt abdominal pain is still not well controlled. We have increased his dilaudid to 2 mg every 3 hours.   Assessment/Plan:  Back pain/Liver mass/unintentional weight loss: possibly hepatocellular carcinoma. Biopsy results pending.  - IR Consulted and he underwent biopsy of the liver on 12/12. CT chest did not reveal any lung nodules or masses to suggest a primary lung malignancy.  His  AFP is elevated. GI consulted and hepatitis serology negative so far.  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  - pt's wife requested for hospice evaluation, called palliative care for hospice and goals of care.  - - Increase his home dose of narcotics and started him on dilaudid IV to better control the back pain. added MS CONTIN 30 mg BID for better pain control, as per recommendations from Dr Myrle Sheng.  Hyperkalemia:  - resolved after fluid hydration .  - Most likely due to decreased intravascular volume, EKG was done that showed no peak T waves.   Hyponatremia:  - improved - This most  likely seems do to decreased intravascular volume.  - We started him on IV fluids for the next 12 hours repeat a basic metabolic panel in the morning showed improvement.   Acute kidney injury/ CKD (chronic kidney disease) stage 3, GFR 30-59 ml/min:  - Most likely prerenal ischemia, his mucous membranes are dry. His urine specific gravity is 1013, at home he is on Lasix and hydrochlorothiazide. We'll have stopped these. Baseline creatinine is between 1.8 to 2. And his creatinine on admission is 2.8.  - his fluids are improving.  A-fib:  - Currently rate controlled unknown reason why he is not on anticoagulation. He is not the best of historians.  - Holding aspirin for biopsy.   HTN (hypertension):  - Continue beta blocker hold diuretics.  - Seems to be stable.   DM2 (diabetes mellitus, type 2)  - hypoglycemia episode, cut down the lantus dose to 22 units from 45 units. Resume SSI.  -  CBG (last 3)   Recent Labs  03/28/13 1629 03/28/13 2315 03/29/13 0945  GLUCAP 120* 96 19*    Hypotension: - resolved.  - unclear etiology, probably from dehydration. Received fluid boluses and his bp parameters improved. He is currently asymptomatic.   Leukocytosis; - probably from the polycythemia vera.   Hypokalemia; replete as needed.    DVT prophylaxis - SCD'S   Code Status: full code Family Communication: family at bedside, discussed the plan of care with the patient and family at bedside Disposition Plan: pending Hospice   Consultants:  IR  GI   Oncology  Palliative care.  Procedures:  Liver biopsy ON 12/12.   Antibiotics:  NONE  HPI/Subjective: Teary eyed and anxious about the liver biopsy results. HE Reports his abdominal pain is not well controlled, increased dilaudid. Spoke to wife in detail, and requested palliative care for hospice.   Objective: Filed Vitals:   03/29/13 0922  BP: 128/68  Pulse: 88  Temp: 98.5 F (36.9 C)  Resp: 18    Intake/Output  Summary (Last 24 hours) at 03/29/13 1206 Last data filed at 03/29/13 0504  Gross per 24 hour  Intake    240 ml  Output    650 ml  Net   -410 ml   Filed Weights   03/25/13 2101 03/26/13 2226 03/28/13 2317  Weight: 60.056 kg (132 lb 6.4 oz) 59.33 kg (130 lb 12.8 oz) 61.326 kg (135 lb 3.2 oz)    Exam:   General:  Alert afebrile comfortable  Cardiovascular: s1s2  Respiratory: ctab  Abdomen: soft TENDER in the RUQ bs+  Musculoskeletal: left BKA  Data Reviewed: Basic Metabolic Panel:  Recent Labs Lab 03/24/13 1120 03/24/13 1840 03/25/13 0530 03/28/13 0519 03/29/13 1002  NA 130*  --  134* 143 140  K 5.5*  --  3.6 2.9* 3.1*  CL 79*  --  88* 98 101  CO2 19  --  23 28 30   GLUCOSE 131*  --  52* 85 130*  BUN 94*  --  87* 35* 28*  CREATININE 2.96* 2.89* 2.75* 1.59* 1.37*  CALCIUM 10.0  --  8.6 9.1 9.2  MG  --   --   --  2.0  --    Liver Function Tests:  Recent Labs Lab 03/25/13 0530 03/28/13 0519  AST 23 18  ALT 12 11  ALKPHOS 141* 123*  BILITOT 1.9* 1.9*  PROT 5.7* 5.5*  ALBUMIN 3.6 3.1*   No results found for this basename: LIPASE, AMYLASE,  in the last 168 hours No results found for this basename: AMMONIA,  in the last 168 hours CBC:  Recent Labs Lab 03/24/13 1120 03/24/13 1300 03/24/13 1840 03/25/13 0530 03/28/13 0519  WBC 49.9* 53.4* 49.9* 43.7* 29.9*  NEUTROABS 46.4* 49.6*  --   --   --   HGB 17.8* 16.8 16.5 15.7 15.2  HCT 65.2* 56.9* 57.0* 54.0* 54.2*  MCV 73.3* 72.4* 71.6* 71.4* 72.8*  PLT 392 477* 385 391 284   Cardiac Enzymes: No results found for this basename: CKTOTAL, CKMB, CKMBINDEX, TROPONINI,  in the last 168 hours BNP (last 3 results) No results found for this basename: PROBNP,  in the last 8760 hours CBG:  Recent Labs Lab 03/28/13 0721 03/28/13 1130 03/28/13 1629 03/28/13 2315 03/29/13 0945  GLUCAP 103* 135* 120* 96 19*    No results found for this or any previous visit (from the past 240 hour(s)).   Studies: No  results found.  Scheduled Meds: . acetaminophen  1,000 mg Oral Once  . allopurinol  100 mg Oral Daily  . feeding supplement (GLUCERNA SHAKE)  237 mL Oral Q24H  . heparin  5,000 Units Subcutaneous Q8H  . insulin aspart  0-9 Units Subcutaneous TID WC  . insulin detemir  22 Units Subcutaneous QHS  . potassium chloride  40 mEq Oral BID  . tamsulosin  0.8 mg Oral Daily   Continuous Infusions:   Principal Problem:   Liver mass Active Problems:   A-fib   HTN (hypertension)   DM2 (diabetes mellitus, type 2)   CKD (chronic kidney disease) stage 3, GFR 30-59  ml/min   Hyponatremia   AKI (acute kidney injury)    Time spent: 25  minutes    Myia Bergh  [facility] Pager [phone]. If 7PM-7AM, please contact night-coverage at [email or web address], password [facility] 03/29/2013, 12:06 PM  LOS: 5 days

## 2013-03-29 NOTE — Progress Notes (Signed)
Event: RN paged NP secondary to pt requesting Maxzide. Explained that pt was "dry" on admission with increased creatinine and also had hyponatremia, so this med was stopped. Also, RN reports several instances of hypoglycemia today requiring multiple doses of D50. RN reports pt is not eating much at all and drinking very little. Start D10 at 50cc/hr and adjust as needed. Will change CBGs to q4hr with sensitive SSI. RN instructed to call for sugars below 70 or rising to 200. Levemir d/c'd for now.  Jimmye Norman, NP [facility]

## 2013-03-29 NOTE — Progress Notes (Signed)
Thank you for consulting the Palliative Medicine Team at [facility] to meet your patient's and family's needs.   The reason that you asked [place] to see your patient is  For GOC  We have scheduled your patient for a meeting: 12/17 2pm  The Surrogate decision make is: spouse, Donald Dudley information: [phone]  Other family members that need to be present: NA    Your patient is able/unable to participate: unable, memory issues  Additional Narrative:  Will need to address pain issues and fact that patient may not be able to be cared for at home.  Spouse brought up hospice care.  Awaiting pathology to help with prognosis.  Discussed with Dr. Blake Divine and Dr. Truett Perna.  Donald Fikes L. Ladona Ridgel, MD MBA The Palliative Medicine Team at [facility] Phone: [phone] Pager: [phone]

## 2013-03-30 DIAGNOSIS — Z515 Encounter for palliative care: Secondary | ICD-10-CM

## 2013-03-30 DIAGNOSIS — E860 Dehydration: Secondary | ICD-10-CM

## 2013-03-30 DIAGNOSIS — M549 Dorsalgia, unspecified: Secondary | ICD-10-CM

## 2013-03-30 DIAGNOSIS — D45 Polycythemia vera: Secondary | ICD-10-CM

## 2013-03-30 DIAGNOSIS — E871 Hypo-osmolality and hyponatremia: Secondary | ICD-10-CM

## 2013-03-30 LAB — GLUCOSE, CAPILLARY
Glucose-Capillary: 115 mg/dL — ABNORMAL HIGH (ref 70–99)
Glucose-Capillary: 119 mg/dL — ABNORMAL HIGH (ref 70–99)
Glucose-Capillary: 85 mg/dL (ref 70–99)

## 2013-03-30 LAB — BASIC METABOLIC PANEL
Chloride: 101 mEq/L (ref 96–112)
Creatinine, Ser: 1.33 mg/dL (ref 0.50–1.35)
GFR calc Af Amer: 59 mL/min — ABNORMAL LOW (ref >90)
Glucose, Bld: 87 mg/dL (ref 70–99)
Potassium: 4.2 mEq/L (ref 3.5–5.1)

## 2013-03-30 MED ORDER — MORPHINE SULFATE ER 15 MG PO TBCR
45.0000 mg | EXTENDED_RELEASE_TABLET | Freq: Two times a day (BID) | ORAL | Status: DC
  Administered 2013-03-30 – 2013-03-31 (×2): 45 mg via ORAL
  Filled 2013-03-30 (×3): qty 3

## 2013-03-30 NOTE — Progress Notes (Signed)
Patient weepy.  Complains of nausea and pain radiating from lower mid back down right leg.  Dr. Truett Perna in to see.  Antiemetic and pain medication given.

## 2013-03-30 NOTE — Consult Note (Signed)
Patient ZO:XWRU LULA Dudley      DOB: [birth year]      EAV:[id number]     Consult Note from the Palliative Medicine Team at [facility]    Consult Requested by: Dr. Blake Divine      PCP: Donald Peck, MD Reason for Consultation: Goals of care and options.   Phone Number:[phone]  Assessment of patients Current state: 72 yo male awaiting results from liver biopsy to determine possible hepatocellular carcinoma. He has pain issues with back pain that radiates down his leg that has worsened over the past 2 yrs. He is scheduled for a whole bone scan and possibly MRI of lumbar spine - per Dr. Truett Perna. He has had polycythemia vera, left BKA, MI, CHF, CAD, CLL, DM. He has also had nausea/vomitting and a weight loss of 30 lbs over the past 3 weeks.  Donald Dudley lives at home with his wife of 55 yrs, Donald Dudley. Donald Dudley and their two daughters, Donald Dudley and Donald Dudley are present during the meeting. The family is local in [place] and [place] lives in [place]. We discussed a primarily "what if" conversation focusing on Donald Dudley wishes in the setting that the pathology comes back positive for metastatic disease. Donald Dudley is discussing with his family a possible DNR - he says that he "has suffered enough in his life" and his wife confirms this acknowledgment. Donald Dudley and Donald Dudley became very tearful during our conversation. We discussed Advance Directives and the importance of having these discussions and Donald Dudley decision maker would be his wife Donald Dudley) in the event that he is unable to make decisions for himself. She says that she would want input from their daughters as well. Everyone seems agreeable to follow his wishes. We plan to continue this discussion with more specific decision making after Dr. Truett Perna shares the results of the pathology report and gives treatment options for [place] to discuss.   The possibility of hospice in the home and residential hospice was  inititiated and explained to them. We will continue to discuss these options as they aline with Donald Dudley goals and outcomes. We will continue to support and guide as difficult discussions and decisions are to be had.  Goals of Care: 1.  Code Status: FULL   2. Scope of Treatment: Goals will be clarified further dependent on results and outcomes. 1. Vital Signs: as ordered 2. Respiratory/Oxygen: as needed 3. Review of Medications to be discontinued: none currently 4. Labs: as ordered 5. Consults: Spiritual care   4. Disposition: Dependent on outcomes.   3. Symptom Management:   1. Anxiety/Agitation: Alprazolam prn. 2. Pain: Increase MS Contin to 45mg  BID. Continue to utilize Dilaudid prn for breakthrough pain.  3. Bowel Regimen: Miralax and Dulcolax supp prn. 4. Nausea/Vomiting: Zofran prn.   4. Psychosocial: Provided emotional support to patient and family during difficult discussion.  5. Spiritual: Spiritual care consult placed.    Brief HPI: 72 yo male with a complex medical history. He believes/fears that pathology results will show cancer.   ROS: +Pain   PMH:  Past Medical History  Diagnosis Date  . S/P BKA (below knee amputation)   . History of cholecystectomy   . Hx of CABG   . PVD (peripheral vascular disease)   . Peripheral neuropathy   . Dyslipidemia   . Hyperlipidemia   . Hypercholesterolemia   . Chest pain   . CAD (coronary artery disease)     CABG - 1993  /  PTCA - 2004  /  Cath - 2006  . Gout     takes Allopurinol daily  . CHF (congestive heart failure)   . Polycythemia   . Atrial fibrillation     not on Coumadin  . MI (myocardial infarction) 1991  . Bruises easily   . Constipation     related to pain meds-takes OTC stool softener daily  . Peripheral edema     takes Torsemide prn  . Urinary frequency   . Enlarged prostate     takes flomax daily  . Anxiety     takes Xanax daily  . Insomnia     Ambien prn;but none if over a yr  .  Anginal pain     Take isosorbide  . Shingles rash   . CLL (chronic lymphocytic leukemia)   . Arthritis   . Diabetes mellitus     takes Novolin 70/30;average fasting 100-120  . Hypertension     takes Atenolol,Maxzide,and Isosorbide daily  . Chronic kidney disease, stage III (moderate)   . Bilateral renal artery stenosis     status post stents     PSH: Past Surgical History  Procedure Laterality Date  . Coronary angioplasty with stent placement  2004    second diagonal artery -- [name], M.D.   . Cardiac catheterization  2006    Est. EF of 65% -- Diffuse coronary artery disease.  The second diagonal artery that was angioplastied in 2004 is now occluded.  It is no longer a candidate for PTCA since it is now flush occluded.  Normal LV systolic function.  [name], M.D.  . Renal artery stent  2003    Bilateral renal artery stenosis  . Total knee arthroplasty      left  . Distal clavicle excision  2003    SURGEON:  [name], M.D.  . Tendon repair      Left Achilles repair x2  . Coronary artery bypass graft  1993    left internal mammary artery to his LAD artery --   . Coronary artery bypass graft  1994    vein graft failure of in his right coronary artery  . Colonoscopy    . Esophagogastroduodenoscopy    . I&d extremity  09/10/2011    Procedure: IRRIGATION AND DEBRIDEMENT EXTREMITY;  Surgeon: Sherren Kerns, MD;  Location: [facility] OR;  Service: Vascular;  Laterality: Left;  OF BKA  . Pr vein bypass graft,aorto-fem-pop    . Joint replacement  1998    Left knee  . Below knee leg amputation      left  . Cholecystectomy    . I&d extremity  04/09/2012    Procedure: IRRIGATION AND DEBRIDEMENT EXTREMITY;  Surgeon: Sharma Covert, MD;  Location: [facility] OR;  Service: Orthopedics;  Laterality: Bilateral;  right middle finger, left index finger   I have reviewed the FH and SH and  If appropriate update it with new information. Allergies  Allergen Reactions  .  Amiodarone Swelling  . Colchicine Swelling    diarrhea  . Crestor [Rosuvastatin Calcium] Swelling  . Lipitor [Atorvastatin Calcium] Swelling  . Lisinopril Other (See Comments)    Unknown reaction  . Lyrica [Pregabalin] Swelling  . Mevacor [Lovastatin] Swelling    GI-bleed   . Micardis [Telmisartan] Other (See Comments)    Unknown reaction  . Neurontin [Gabapentin] Swelling  . Norvasc [Amlodipine Besylate] Swelling  . Potassium Chloride Er Other (See Comments)    Unknown reaction  . Ranolazine Er Other (See  Comments)    Unknown reaction  . Zocor [Simvastatin - High Dose] Swelling   Scheduled Meds: . acetaminophen  1,000 mg Oral Once  . allopurinol  100 mg Oral Daily  . feeding supplement (GLUCERNA SHAKE)  237 mL Oral Q24H  . heparin  5,000 Units Subcutaneous Q8H  . insulin aspart  0-9 Units Subcutaneous Q4H  . morphine  45 mg Oral Q12H  . tamsulosin  0.8 mg Oral Daily   Continuous Infusions: . dextrose 50 mL/hr at 03/29/13 2144   PRN Meds:.ALPRAZolam, bisacodyl, HYDROmorphone (DILAUDID) injection, ondansetron (ZOFRAN) IV, ondansetron, polyethylene glycol    BP 121/75  Pulse 84  Temp(Src) 98.8 F (37.1 C) (Oral)  Resp 18  Ht 5\' 8"  (1.727 m)  Wt 61.78 kg (136 lb 3.2 oz)  BMI 20.71 kg/m2  SpO2 95%   PPS: 40%   Intake/Output Summary (Last 24 hours) at 03/30/13 1608 Last data filed at 03/30/13 1400  Gross per 24 hour  Intake    360 ml  Output    775 ml  Net   -415 ml   LBM: 03/23/13                        Physical Exam:  General: NAD, thin-malnourished HEENT: [place]/AT, no JVD Chest: CTA, scar from hx CABG CVS: RRR, S1 S2 Abdomen: Soft, NT, ND, +BS Ext: Lt BKA, open sore rt heel, no edema Neuro: A&O, following commands, able to participate in conversation  Labs: CBC    Component Value Date/Time   WBC 29.9* 03/28/2013 0519   WBC 16.9* 12/06/2012 1414   RBC 7.44* 03/28/2013 0519   RBC 6.21* 12/06/2012 1414   HGB 15.2 03/28/2013 0519   HGB 13.6 12/06/2012  1414   HCT 54.2* 03/28/2013 0519   HCT 46.7 12/06/2012 1414   PLT 284 03/28/2013 0519   PLT 214 12/06/2012 1414   MCV 72.8* 03/28/2013 0519   MCV 75.2* 12/06/2012 1414   MCH 20.4* 03/28/2013 0519   MCH 21.9* 12/06/2012 1414   MCHC 28.0* 03/28/2013 0519   MCHC 29.2* 12/06/2012 1414   RDW 20.3* 03/28/2013 0519   RDW 19.2* 12/06/2012 1414   LYMPHSABS 3.0 03/24/2013 1300   LYMPHSABS 0.7* 12/06/2012 1414   MONOABS 0.7 03/24/2013 1300   MONOABS 1.3* 12/06/2012 1414   EOSABS 0.1 03/24/2013 1300   EOSABS 0.2 12/06/2012 1414   BASOSABS 0.1 03/24/2013 1300   BASOSABS 0.3* 12/06/2012 1414    BMET    Component Value Date/Time   NA 139 03/30/2013 0532   K 4.2 03/30/2013 0532   CL 101 03/30/2013 0532   CO2 30 03/30/2013 0532   GLUCOSE 87 03/30/2013 0532   BUN 26* 03/30/2013 0532   CREATININE 1.33 03/30/2013 0532   CALCIUM 8.9 03/30/2013 0532   GFRNONAA 51* 03/30/2013 0532   GFRAA 59* 03/30/2013 0532    CMP     Component Value Date/Time   NA 139 03/30/2013 0532   K 4.2 03/30/2013 0532   CL 101 03/30/2013 0532   CO2 30 03/30/2013 0532   GLUCOSE 87 03/30/2013 0532   BUN 26* 03/30/2013 0532   CREATININE 1.33 03/30/2013 0532   CALCIUM 8.9 03/30/2013 0532   PROT 5.5* 03/28/2013 0519   ALBUMIN 3.1* 03/28/2013 0519   AST 18 03/28/2013 0519   ALT 11 03/28/2013 0519   ALKPHOS 123* 03/28/2013 0519   BILITOT 1.9* 03/28/2013 0519   GFRNONAA 51* 03/30/2013 0532   GFRAA 59* 03/30/2013 0532  Time In Time Out Total Time Spent with Patient Total Overall Time  1400 1530     Greater than 50%  of this time was spent counseling and coordinating care related to the above assessment and plan.  Yong Channel, NP Palliative Medicine Team Team Phone # [phone]   Discussed plan with Dr. Catha Gosselin.

## 2013-03-30 NOTE — Progress Notes (Addendum)
IP PROGRESS NOTE  Subjective:  He is much more alert today. He reports barely remembering my visit yesterday. He thinks this was secondary to hypoglycemia. He is now able to get a better history.  Mr. Bazar has noted right abdomen and flank pain for several weeks. He reports a one-year history of right leg pain. He reports being evaluated for the pain without an apparent etiology. He also complains of a right foot drop and right lower leg and foot numbness. The pain is worse with movement at the right hip.   Objective: Vital signs in last 24 hours: Blood pressure 121/75, pulse 84, temperature 98.8 F (37.1 C), temperature source Oral, resp. rate 18, height 5\' 8"  (1.727 m), weight 136 lb 3.2 oz (61.78 kg), SpO2 95.00%.  Intake/Output from previous day: 12/16 0701 - 12/17 0700 In: 360 [P.O.:360] Out: 900 [Urine:900]  Physical Exam:   Musculoskeletal: Pain with movement at the right hip. Skin: Pressure ulcer at the right heel Neurologic:  Alert, follows commands, the right foot strength appears intact, nontender light touch at the right foot    Lab Results:  Recent Labs  03/28/13 0519  WBC 29.9*  HGB 15.2  HCT 54.2*  PLT 284    BMET  Recent Labs  03/29/13 2135 03/30/13 0532  NA 136 139  K 4.6 4.2  CL 100 101  CO2 26 30  GLUCOSE 93 87  BUN 28* 26*  CREATININE 1.31 1.33  CALCIUM 8.8 8.9   03/25/2013-AFP  1615  Studies/Results: No results found.  Medications: I have reviewed the patient's current medications.  Assessment/Plan:  1. History of polycythemia vera  2. Chronic leukocytosis, neutrophilia, secondary to #1  3. Status post left BKA  4. Atrial fibrillation  5. History of "Sweet syndrome "  6. History of shingles  7. History of sacral decubitus ulcers  8. Right liver mass and right retroperitoneal versus adrenal mass-status post an ultrasound-guided biopsy of the liver 03/25/2013 with the pathology confirming hepatocellular  carcinoma  9. abdomen/flank pain-likely secondary to the liver or retroperitoneal mass  10. Right leg pain and numbness-he appears to have radicular pain in the right leg. He could have metastatic disease involving the lumbar spine.     Recommendations:  1. continue narcotic analgesics for pain 2. MRI of the lumbar spine-I will confirm that he does not have a pacemaker/defibrillator that will preclude an MRI 3. the pathology report returned after I saw him today. I will discuss the diagnosis of hepatocellular carcinoma with Mr. Gittins on 03/31/2013. 4. Consider a [facility] referral for home care  His mental status and pain appear much improved today. The etiology of the right leg pain and numbness is unclear. I will order an MRI of the lumbar spine (if he is able to have this study) to look for evidence of tumor causing radicular pain.   LOS: 6 days   Monteen Toops  03/30/2013, 1:33 PM

## 2013-03-30 NOTE — Progress Notes (Signed)
[facility]                                                                                Patient Demographics  Donald Dudley, is a 72 y.o. male, DOB - 1940/05/30, ZOX:[id number]  Admit date - 03/24/2013   Admitting Physician Marinda Elk, MD  Outpatient Primary MD for the patient is ROBERTS, Vernie Ammons, MD  LOS - 6   Chief Complaint  Patient presents with  . Back Pain  . Abdominal Pain        Assessment & Plan   Principal Problem:   Liver mass Active Problems:   A-fib   HTN (hypertension)   DM2 (diabetes mellitus, type 2)   CKD (chronic kidney disease) stage 3, GFR 30-59 ml/min   Hyponatremia   AKI (acute kidney injury)  Back pain/liver mass -Patient underwent liver biopsy by IR on 1212, pending those results. -AFP was elevated, GI was consulted, hepatitis serology negative -Possible hepatocellular carcinoma -Oncology, Dr. Truett Perna consulted and following -Continue pain control with IV Dilaudid, MS Contin 30 mg twice daily  Hyponatremia -Improved likely due to hypovolemia -Will continue to monitor BMP  Acute on chronic kidney disease, stage III -Likely prerenal with prerenal ischemia -Hold nephrotoxic agents -Creatinine trending downward 1.33 -Will continue to monitor BMP  Hypertension -Currently stable, diuretics on hold, continue beta blocker  Diabetes mellitus type 2 with episodes of hypoglycemia -Lantus held -Patient placed on D. 10 at 50 cc per hour, will continue to monitor CBGs  Hypotension -Resolved, likely secondary to dehydration  Leukocytosis -Likely secondary to polycythemia, currently stable, afebrile with no signs of infection  Hypokalemia -Resolved we'll continue to monitor BMP  Code Status: Full  Family Communication: None at this time. Will attempt to contact the wife.  Disposition Plan: Admitted. Once blood sugars have stabilized will likely discharge.   Procedures  -Liver biopsy conducted on 12/12 by  IR  Consults   IR GI Oncology Palliative care  DVT Prophylaxis  SCDs  Lab Results  Component Value Date   PLT 284 03/28/2013    Medications  Scheduled Meds: . acetaminophen  1,000 mg Oral Once  . allopurinol  100 mg Oral Daily  . feeding supplement (GLUCERNA SHAKE)  237 mL Oral Q24H  . heparin  5,000 Units Subcutaneous Q8H  . insulin aspart  0-9 Units Subcutaneous Q4H  . morphine  30 mg Oral Q12H  . tamsulosin  0.8 mg Oral Daily   Continuous Infusions: . dextrose 50 mL/hr at 03/29/13 2144   PRN Meds:.ALPRAZolam, bisacodyl, HYDROmorphone (DILAUDID) injection, ondansetron (ZOFRAN) IV, ondansetron, polyethylene glycol  Antibiotics    Anti-infectives   None     Time Spent in minutes   30 minutes  Vinal Rosengrant D.O. on 03/30/2013 at 10:45 AM  Between 7am to 7pm - Pager - [phone]  After 7pm go to [email or web address] - password [email or web address]  And look for the night coverage person covering for me after hours  [facility] Office  [phone]    Subjective:   Donald Dudley seen and examined today.  Patient complains of pain all over. He is also very anxious about his liver biopsy results. He feels that it  is cancer.   Objective:   Filed Vitals:   03/29/13 1730 03/29/13 2039 03/30/13 0430 03/30/13 0818  BP: 117/67 111/66 123/54 130/73  Pulse: 71 77 81 75  Temp: 98.2 F (36.8 C) 99 F (37.2 C) 98.6 F (37 C) 98.1 F (36.7 C)  TempSrc: Oral Oral Oral Oral  Resp: 18 17 18 18   Height:      Weight:  61.78 kg (136 lb 3.2 oz)    SpO2: 100% 95% 98% 95%    Wt Readings from Last 3 Encounters:  03/29/13 61.78 kg (136 lb 3.2 oz)  12/10/12 75.8 kg (167 lb 1.7 oz)  12/06/12 73.301 kg (161 lb 9.6 oz)     Intake/Output Summary (Last 24 hours) at 03/30/13 1045 Last data filed at 03/30/13 0852  Gross per 24 hour  Intake    480 ml  Output    875 ml  Net   -395 ml    Exam  General: Well developed, malnourished, NAD, appears stated age  HEENT:  NCAT, PERRLA, EOMI, Anicteic Sclera, mucous membranes moist. No pharyngeal erythema or exudates  Neck: Supple, no JVD, no masses  Cardiovascular: S1 S2 auscultated, no rubs, murmurs or gallops. Regular rate and rhythm.  Respiratory: Clear to auscultation bilaterally with equal chest rise  Abdomen: Soft, nontender, nondistended, + bowel sounds  Extremities: LBKA, no clubbing or cyanosis otherwise noted  Neuro: AAOx3, cranial nerves grossly intact.   Skin: Without rashes exudates or nodules  Psych: Depressed affect and demeanor with intact judgement and insight  Data Review   Micro Results No results found for this or any previous visit (from the past 240 hour(s)).  Radiology Reports Ct Abdomen Pelvis Wo Contrast  03/24/2013   CLINICAL DATA:  Back pain  EXAM: CT ABDOMEN AND PELVIS WITHOUT CONTRAST  TECHNIQUE: Multidetector CT imaging of the abdomen and pelvis was performed following the standard protocol without intravenous contrast.  COMPARISON:  None.  FINDINGS: Sagittal images of the spine shows no destructive bony lesions. Mild disc space flattening with mild anterior spurring at L2-L3 level.  The study is markedly limited without IV contrast. Lung bases are unremarkable. There is heterogeneous low density lesion posterior aspect of the right hepatic lobe measures at least 6 cm. This is highly suspicious for primary or secondary malignancy. Further correlation with enhanced study is recommended. Extensive atherosclerotic calcifications abdominal aorta, bilateral proximal renal artery, SMA, splenic artery and iliac arteries are noted. No aortic aneurysm. Probable stent in right renal artery.  There is right adrenal lesion measures 6.7 by 3.7 cm. This is highly suspicious for primary or secondary malignancy. Further correlation with enhanced MRI is recommended.  The patient is status post cholecystectomy. A 2nd low-density lesion within right hepatic lobe anteriorly measures 2 cm.  Unenhanced spleen is unremarkable. Mild pancreatic atrophy. The left adrenal gland is unremarkable.  Kidneys shows bilateral cortical thinning. No nephrolithiasis. No hydronephrosis or hydroureter. No small bowel obstruction. Mild distended urinary bladder. Prostate gland measures 3.7 x 4.2 cm. Atherosclerotic calcifications of femoral arteries. No calcified calculi are noted within urinary bladder. No ureteral calculi are identified.  IMPRESSION: 1. There is low density lesion posterior aspect of right hepatic lobe measures 6.3 cm highly suspicious for primary or secondary malignancy. Further assessment with enhanced study is recommended. There is right adrenal lesion measures 6.7 x 3.7 cm. Primary or secondary malignancy is suspected. Further evaluation is recommended. 2. Extensive atherosclerotic vascular calcifications. 3. No hydronephrosis or hydroureter. 4. Mild distended urinary bladder. These  results were called by telephone at the time of interpretation on 03/24/2013 at 2:34 PM to Dr. Blane Ohara , who verbally acknowledged these results. Mild enlarged prostate gland.   Electronically Signed   By: Natasha Mead M.D.   On: 03/24/2013 14:35   Ct Chest Wo Contrast  03/24/2013   CLINICAL DATA:  Abnormal finding on the abdomen and pelvis CT. A low-density liver lesion suggests a primary versus metastatic neoplasm. Large adrenal mass on the right also evident on the CT. Patient has no chest complaints.  EXAM: CT CHEST WITHOUT CONTRAST  TECHNIQUE: Multidetector CT imaging of the chest was performed following the standard protocol without IV contrast.  COMPARISON:  Abdomen and pelvis CT, 03/24/2013  FINDINGS: There are multiple bilateral calcified granulomas. There is no lung nodule or mass to suggest a primary lung malignancy as the source for the liver and adrenal lesions. There are changes of moderate centrilobular emphysema. Minor subsegmental atelectasis is noted at the bases. There is no consolidation or  edema. No pleural effusion or pneumothorax is seen.  The heart is mildly enlarged. There are dense coronary artery calcifications. No mediastinal or hilar masses or pathologically enlarged lymph nodes are appreciated.  There are no osteoblastic or osteolytic lesions.  IMPRESSION: 1. No lung mass or significant nodule is seen to suggest a primary lung malignancy as a source of the liver or adrenal lesions. There is no evidence of lung metastatic disease. 2. Multiple bilateral small calcified lung granulomas are noted. 3. There are changes of emphysema, but no acute findings in the lungs. 4. No mediastinal or hilar masses or enlarged lymph nodes.   Electronically Signed   By: Amie Portland M.D.   On: 03/24/2013 20:16   US Biopsy  03/25/2013   CLINICAL DATA:  Large right liver mass, adrenal mass, unknown primary  EXAM: ULTRASOUND GUIDED CORE BIOPSY OF RIGHT LIVER MASS  MEDICATIONS: 2 mg IV Versed; 100 mcg IV Fentanyl  Total Moderate Sedation Time: 15 MIN  PROCEDURE: The procedure, risks, benefits, and alternatives were explained to the patient. Questions regarding the procedure were encouraged and answered. The patient understands and consents to the procedure.  The right upper quadrant was prepped with Betadine in a sterile fashion, and a sterile drape was applied covering the operative field. A sterile gown and sterile gloves were used for the procedure. Local anesthesia was provided with 1% Lidocaine.  Previous imaging reviewed. Preliminary ultrasound performed of the right upper quadrant. Large right liver mass demonstrated. Under sterile conditions and local anesthesia, a 17 gauge 6.8 cm access needle was advanced percutaneously into the liver lesion. Needle position confirmed with ultrasound. Images obtained for documentation. 2 18 gauge 3 cm core biopsies obtained. These were placed in formalin. Needle tract embolized with Gel-Foam. No immediate complication. Patient tolerated the procedure well.   COMPLICATIONS: None.  FINDINGS: Imaging confirms needle placement in the large right liver mass.  IMPRESSION: Successful ultrasound large right liver mass 18 gauge core biopsy   Electronically Signed   By: Ruel Favors M.D.   On: 03/25/2013 14:36    CBC  Recent Labs Lab 03/24/13 1120 03/24/13 1300 03/24/13 1840 03/25/13 0530 03/28/13 0519  WBC 49.9* 53.4* 49.9* 43.7* 29.9*  HGB 17.8* 16.8 16.5 15.7 15.2  HCT 65.2* 56.9* 57.0* 54.0* 54.2*  PLT 392 477* 385 391 284  MCV 73.3* 72.4* 71.6* 71.4* 72.8*  MCH 20.0* 21.4* 20.7* 20.8* 20.4*  MCHC 27.3* 29.5* 28.9* 29.1* 28.0*  RDW 19.0* 19.7* 20.1*  19.8* 20.3*  LYMPHSABS 2.0 3.0  --   --   --   MONOABS 1.5* 0.7  --   --   --   EOSABS 0.0 0.1  --   --   --   BASOSABS 0.0 0.1  --   --   --     Chemistries   Recent Labs Lab 03/25/13 0530 03/28/13 0519 03/29/13 1002 03/29/13 2135 03/30/13 0532  NA 134* 143 140 136 139  K 3.6 2.9* 3.1* 4.6 4.2  CL 88* 98 101 100 101  CO2 23 28 30 26 30   GLUCOSE 52* 85 130* 93 87  BUN 87* 35* 28* 28* 26*  CREATININE 2.75* 1.59* 1.37* 1.31 1.33  CALCIUM 8.6 9.1 9.2 8.8 8.9  MG  --  2.0  --   --   --   AST 23 18  --   --   --   ALT 12 11  --   --   --   ALKPHOS 141* 123*  --   --   --   BILITOT 1.9* 1.9*  --   --   --    ------------------------------------------------------------------------------------------------------------------ estimated creatinine clearance is 42.6 ml/min (by C-G formula based on Cr of 1.33). ------------------------------------------------------------------------------------------------------------------ No results found for this basename: HGBA1C,  in the last 72 hours ------------------------------------------------------------------------------------------------------------------ No results found for this basename: CHOL, HDL, LDLCALC, TRIG, CHOLHDL, LDLDIRECT,  in the last 72  hours ------------------------------------------------------------------------------------------------------------------ No results found for this basename: TSH, T4TOTAL, FREET3, T3FREE, THYROIDAB,  in the last 72 hours ------------------------------------------------------------------------------------------------------------------ No results found for this basename: VITAMINB12, FOLATE, FERRITIN, TIBC, IRON, RETICCTPCT,  in the last 72 hours  Coagulation profile  Recent Labs Lab 03/24/13 1840  INR 1.11    No results found for this basename: DDIMER,  in the last 72 hours  Cardiac Enzymes No results found for this basename: CK, CKMB, TROPONINI, MYOGLOBIN,  in the last 168 hours ------------------------------------------------------------------------------------------------------------------ No components found with this basename: POCBNP,

## 2013-03-31 ENCOUNTER — Inpatient Hospital Stay (HOSPITAL_COMMUNITY): Payer: Medicare Other

## 2013-03-31 DIAGNOSIS — K5900 Constipation, unspecified: Secondary | ICD-10-CM

## 2013-03-31 DIAGNOSIS — Q891 Congenital malformations of adrenal gland: Secondary | ICD-10-CM

## 2013-03-31 DIAGNOSIS — L97409 Non-pressure chronic ulcer of unspecified heel and midfoot with unspecified severity: Secondary | ICD-10-CM

## 2013-03-31 DIAGNOSIS — C220 Liver cell carcinoma: Secondary | ICD-10-CM

## 2013-03-31 DIAGNOSIS — R52 Pain, unspecified: Secondary | ICD-10-CM

## 2013-03-31 DIAGNOSIS — M79609 Pain in unspecified limb: Secondary | ICD-10-CM

## 2013-03-31 DIAGNOSIS — R209 Unspecified disturbances of skin sensation: Secondary | ICD-10-CM

## 2013-03-31 DIAGNOSIS — C228 Malignant neoplasm of liver, primary, unspecified as to type: Principal | ICD-10-CM

## 2013-03-31 DIAGNOSIS — I2510 Atherosclerotic heart disease of native coronary artery without angina pectoris: Secondary | ICD-10-CM

## 2013-03-31 DIAGNOSIS — M549 Dorsalgia, unspecified: Secondary | ICD-10-CM

## 2013-03-31 DIAGNOSIS — R109 Unspecified abdominal pain: Secondary | ICD-10-CM

## 2013-03-31 LAB — GLUCOSE, CAPILLARY
Glucose-Capillary: 110 mg/dL — ABNORMAL HIGH (ref 70–99)
Glucose-Capillary: 128 mg/dL — ABNORMAL HIGH (ref 70–99)
Glucose-Capillary: 130 mg/dL — ABNORMAL HIGH (ref 70–99)
Glucose-Capillary: 96 mg/dL (ref 70–99)

## 2013-03-31 LAB — BASIC METABOLIC PANEL
BUN: 22 mg/dL (ref 6–23)
CO2: 27 mEq/L (ref 19–32)
Chloride: 95 mEq/L — ABNORMAL LOW (ref 96–112)
Creatinine, Ser: 1.25 mg/dL (ref 0.50–1.35)

## 2013-03-31 LAB — CBC
HCT: 51.8 % (ref 39.0–52.0)
Hemoglobin: 14.7 g/dL (ref 13.0–17.0)
MCV: 74 fL — ABNORMAL LOW (ref 78.0–100.0)
RBC: 7 MIL/uL — ABNORMAL HIGH (ref 4.22–5.81)
RDW: 20.5 % — ABNORMAL HIGH (ref 11.5–15.5)
WBC: 31.6 10*3/uL — ABNORMAL HIGH (ref 4.0–10.5)

## 2013-03-31 MED ORDER — BISACODYL 5 MG PO TBEC
5.0000 mg | DELAYED_RELEASE_TABLET | Freq: Every day | ORAL | Status: DC
Start: 2013-03-31 — End: 2013-04-02
  Administered 2013-03-31 – 2013-04-01 (×2): 5 mg via ORAL
  Filled 2013-03-31 (×2): qty 1

## 2013-03-31 MED ORDER — SODIUM CHLORIDE 0.9 % IJ SOLN
10.0000 mL | INTRAMUSCULAR | Status: DC | PRN
  Administered 2013-04-01 – 2013-04-02 (×2): 10 mL

## 2013-03-31 MED ORDER — MORPHINE SULFATE ER 15 MG PO TBCR
30.0000 mg | EXTENDED_RELEASE_TABLET | Freq: Two times a day (BID) | ORAL | Status: DC
  Administered 2013-03-31 – 2013-04-01 (×2): 30 mg via ORAL
  Filled 2013-03-31 (×2): qty 2

## 2013-03-31 MED ORDER — HYDROMORPHONE HCL PF 1 MG/ML IJ SOLN
2.0000 mg | INTRAMUSCULAR | Status: DC
  Administered 2013-03-31 – 2013-04-01 (×6): 2 mg via INTRAVENOUS
  Filled 2013-03-31 (×7): qty 2

## 2013-03-31 MED ORDER — HYDROMORPHONE HCL 2 MG PO TABS
4.0000 mg | ORAL_TABLET | ORAL | Status: DC | PRN
  Filled 2013-03-31: qty 2

## 2013-03-31 MED ORDER — HYDROMORPHONE HCL PF 1 MG/ML IJ SOLN
2.0000 mg | INTRAMUSCULAR | Status: DC
  Administered 2013-03-31: 2 mg via INTRAVENOUS
  Filled 2013-03-31: qty 2

## 2013-03-31 MED ORDER — LORAZEPAM 2 MG/ML IJ SOLN
0.5000 mg | Freq: Once | INTRAMUSCULAR | Status: DC
  Filled 2013-03-31: qty 1

## 2013-03-31 NOTE — Progress Notes (Signed)
[facility]                                                                                Patient Demographics  Donald Dudley, is a 72 y.o. male, DOB - 08/19/1940, RUE:[id number]  Admit date - 03/24/2013   Admitting Physician Marinda Elk, MD  Outpatient Primary MD for the patient is ROBERTS, Vernie Ammons, MD  LOS - 7   Chief Complaint  Patient presents with  . Back Pain  . Abdominal Pain        Assessment & Plan   Principal Problem:   Liver mass Active Problems:   A-fib   HTN (hypertension)   DM2 (diabetes mellitus, type 2)   CKD (chronic kidney disease) stage 3, GFR 30-59 ml/min   Hyponatremia   AKI (acute kidney injury)   Palliative care encounter   Back pain  Hepatocellular carcinoma/ Liver mass -Patient underwent liver biopsy by IR on 1212, showing hepatocellular carcinoma -AFP was elevated, GI was consulted, hepatitis serology negative -Oncology, Dr. Truett Perna consulted and following, not a candidate for resection -Continue pain control with IV Dilaudid, MS Contin  Back/Leg pain -Possible related to metastases -MRI ordered by oncology  Hyponatremia -Improved likely due to hypovolemia -Will continue to monitor BMP  Acute on chronic kidney disease, stage III -Likely prerenal with prerenal ischemia -Hold nephrotoxic agents -Creatinine trending downward 1.25 -Will continue to monitor BMP  Hypertension -Currently stable, diuretics on hold, continue beta blocker  Diabetes mellitus type 2 with episodes of hypoglycemia -Lantus held -Will discontinue D10 drip.   -Will continue ISS with CBG monitoring. CBG (last 3)   Recent Labs  03/31/13 0421 03/31/13 0744 03/31/13 1134  GLUCAP 102* 110* 128*   Hypotension -Resolved, likely secondary to dehydration  Leukocytosis -Likely secondary to polycythemia, currently stable, afebrile with no signs of infection  Hypokalemia -Resolved we'll continue to monitor BMP  Code Status:  Full  Family Communication: Wife.  Disposition Plan: Admitted. Once blood sugars have stabilized will likely discharge.    Procedures  -Liver biopsy conducted on 12/12 by IR  Consults   IR GI Oncology Palliative care  DVT Prophylaxis  SCDs  Lab Results  Component Value Date   PLT 261 03/31/2013    Medications  Scheduled Meds: . acetaminophen  1,000 mg Oral Once  . allopurinol  100 mg Oral Daily  . bisacodyl  5 mg Oral Q1500  . heparin  5,000 Units Subcutaneous Q8H  . insulin aspart  0-9 Units Subcutaneous Q4H  . morphine  45 mg Oral Q12H  . tamsulosin  0.8 mg Oral Daily   Continuous Infusions:   PRN Meds:.ALPRAZolam, bisacodyl, HYDROmorphone (DILAUDID) injection, HYDROmorphone, ondansetron (ZOFRAN) IV, ondansetron, polyethylene glycol  Antibiotics    Anti-infectives   None     Time Spent in minutes   30 minutes  Orlo Brickle D.O. on 03/31/2013 at 12:11 PM  Between 7am to 7pm - Pager - [phone]  After 7pm go to [email or web address] - password [email or web address]  And look for the night coverage person covering for me after hours  [facility] Office  [phone]    Subjective:   Donald Dudley seen and examined today.  Patient  complains of pain all over. He is also very anxious and feels bad for his wife having to care for him.   Objective:   Filed Vitals:   03/30/13 1732 03/30/13 2002 03/31/13 0445 03/31/13 0953  BP: 121/48 133/54 127/58 115/49  Pulse: 81 82 87 90  Temp: 98.7 F (37.1 C) 98.4 F (36.9 C) 98.4 F (36.9 C) 98.1 F (36.7 C)  TempSrc: Oral Oral Oral Oral  Resp: 18 18 18 18   Height:  5\' 8"  (1.727 m)    Weight:  63.05 kg (139 lb)    SpO2: 97% 95% 100% 92%    Wt Readings from Last 3 Encounters:  03/30/13 63.05 kg (139 lb)  12/10/12 75.8 kg (167 lb 1.7 oz)  12/06/12 73.301 kg (161 lb 9.6 oz)     Intake/Output Summary (Last 24 hours) at 03/31/13 1211 Last data filed at 03/31/13 0951  Gross per 24 hour  Intake   1320 ml   Output    750 ml  Net    570 ml    Exam  General: Well developed, malnourished, NAD, appears stated age  HEENT: NCAT, PERRLA,  mucous membranes moist.   Neck: Supple, no JVD, no masses  Cardiovascular: S1 S2 auscultated, no rubs, murmurs or gallops. Regular rate and rhythm.  Respiratory: Clear to auscultation bilaterally with equal chest rise  Abdomen: Soft, nontender, nondistended, + bowel sounds  Extremities: LBKA, no clubbing or cyanosis otherwise noted  Neuro: AAOx3, cranial nerves grossly intact.   Skin: Without rashes exudates or nodules  Psych: Depressed affect and demeanor with intact judgement and insight  Data Review   Micro Results No results found for this or any previous visit (from the past 240 hour(s)).  Radiology Reports Ct Abdomen Pelvis Wo Contrast  03/24/2013   CLINICAL DATA:  Back pain  EXAM: CT ABDOMEN AND PELVIS WITHOUT CONTRAST  TECHNIQUE: Multidetector CT imaging of the abdomen and pelvis was performed following the standard protocol without intravenous contrast.  COMPARISON:  None.  FINDINGS: Sagittal images of the spine shows no destructive bony lesions. Mild disc space flattening with mild anterior spurring at L2-L3 level.  The study is markedly limited without IV contrast. Lung bases are unremarkable. There is heterogeneous low density lesion posterior aspect of the right hepatic lobe measures at least 6 cm. This is highly suspicious for primary or secondary malignancy. Further correlation with enhanced study is recommended. Extensive atherosclerotic calcifications abdominal aorta, bilateral proximal renal artery, SMA, splenic artery and iliac arteries are noted. No aortic aneurysm. Probable stent in right renal artery.  There is right adrenal lesion measures 6.7 by 3.7 cm. This is highly suspicious for primary or secondary malignancy. Further correlation with enhanced MRI is recommended.  The patient is status post cholecystectomy. A 2nd low-density  lesion within right hepatic lobe anteriorly measures 2 cm. Unenhanced spleen is unremarkable. Mild pancreatic atrophy. The left adrenal gland is unremarkable.  Kidneys shows bilateral cortical thinning. No nephrolithiasis. No hydronephrosis or hydroureter. No small bowel obstruction. Mild distended urinary bladder. Prostate gland measures 3.7 x 4.2 cm. Atherosclerotic calcifications of femoral arteries. No calcified calculi are noted within urinary bladder. No ureteral calculi are identified.  IMPRESSION: 1. There is low density lesion posterior aspect of right hepatic lobe measures 6.3 cm highly suspicious for primary or secondary malignancy. Further assessment with enhanced study is recommended. There is right adrenal lesion measures 6.7 x 3.7 cm. Primary or secondary malignancy is suspected. Further evaluation is recommended. 2. Extensive atherosclerotic vascular  calcifications. 3. No hydronephrosis or hydroureter. 4. Mild distended urinary bladder. These results were called by telephone at the time of interpretation on 03/24/2013 at 2:34 PM to Dr. Blane Ohara , who verbally acknowledged these results. Mild enlarged prostate gland.   Electronically Signed   By: Natasha Mead M.D.   On: 03/24/2013 14:35   Ct Chest Wo Contrast  03/24/2013   CLINICAL DATA:  Abnormal finding on the abdomen and pelvis CT. A low-density liver lesion suggests a primary versus metastatic neoplasm. Large adrenal mass on the right also evident on the CT. Patient has no chest complaints.  EXAM: CT CHEST WITHOUT CONTRAST  TECHNIQUE: Multidetector CT imaging of the chest was performed following the standard protocol without IV contrast.  COMPARISON:  Abdomen and pelvis CT, 03/24/2013  FINDINGS: There are multiple bilateral calcified granulomas. There is no lung nodule or mass to suggest a primary lung malignancy as the source for the liver and adrenal lesions. There are changes of moderate centrilobular emphysema. Minor subsegmental  atelectasis is noted at the bases. There is no consolidation or edema. No pleural effusion or pneumothorax is seen.  The heart is mildly enlarged. There are dense coronary artery calcifications. No mediastinal or hilar masses or pathologically enlarged lymph nodes are appreciated.  There are no osteoblastic or osteolytic lesions.  IMPRESSION: 1. No lung mass or significant nodule is seen to suggest a primary lung malignancy as a source of the liver or adrenal lesions. There is no evidence of lung metastatic disease. 2. Multiple bilateral small calcified lung granulomas are noted. 3. There are changes of emphysema, but no acute findings in the lungs. 4. No mediastinal or hilar masses or enlarged lymph nodes.   Electronically Signed   By: Amie Portland M.D.   On: 03/24/2013 20:16   US Biopsy  03/25/2013   CLINICAL DATA:  Large right liver mass, adrenal mass, unknown primary  EXAM: ULTRASOUND GUIDED CORE BIOPSY OF RIGHT LIVER MASS  MEDICATIONS: 2 mg IV Versed; 100 mcg IV Fentanyl  Total Moderate Sedation Time: 15 MIN  PROCEDURE: The procedure, risks, benefits, and alternatives were explained to the patient. Questions regarding the procedure were encouraged and answered. The patient understands and consents to the procedure.  The right upper quadrant was prepped with Betadine in a sterile fashion, and a sterile drape was applied covering the operative field. A sterile gown and sterile gloves were used for the procedure. Local anesthesia was provided with 1% Lidocaine.  Previous imaging reviewed. Preliminary ultrasound performed of the right upper quadrant. Large right liver mass demonstrated. Under sterile conditions and local anesthesia, a 17 gauge 6.8 cm access needle was advanced percutaneously into the liver lesion. Needle position confirmed with ultrasound. Images obtained for documentation. 2 18 gauge 3 cm core biopsies obtained. These were placed in formalin. Needle tract embolized with Gel-Foam. No immediate  complication. Patient tolerated the procedure well.  COMPLICATIONS: None.  FINDINGS: Imaging confirms needle placement in the large right liver mass.  IMPRESSION: Successful ultrasound large right liver mass 18 gauge core biopsy   Electronically Signed   By: Ruel Favors M.D.   On: 03/25/2013 14:36    CBC  Recent Labs Lab 03/24/13 1300 03/24/13 1840 03/25/13 0530 03/28/13 0519 03/31/13 0750  WBC 53.4* 49.9* 43.7* 29.9* 31.6*  HGB 16.8 16.5 15.7 15.2 14.7  HCT 56.9* 57.0* 54.0* 54.2* 51.8  PLT 477* 385 391 284 261  MCV 72.4* 71.6* 71.4* 72.8* 74.0*  MCH 21.4* 20.7* 20.8* 20.4* 21.0*  MCHC 29.5* 28.9* 29.1* 28.0* 28.4*  RDW 19.7* 20.1* 19.8* 20.3* 20.5*  LYMPHSABS 3.0  --   --   --   --   MONOABS 0.7  --   --   --   --   EOSABS 0.1  --   --   --   --   BASOSABS 0.1  --   --   --   --     Chemistries   Recent Labs Lab 03/25/13 0530 03/28/13 0519 03/29/13 1002 03/29/13 2135 03/30/13 0532 03/31/13 0750  NA 134* 143 140 136 139 131*  K 3.6 2.9* 3.1* 4.6 4.2 4.3  CL 88* 98 101 100 101 95*  CO2 23 28 30 26 30 27   GLUCOSE 52* 85 130* 93 87 108*  BUN 87* 35* 28* 28* 26* 22  CREATININE 2.75* 1.59* 1.37* 1.31 1.33 1.25  CALCIUM 8.6 9.1 9.2 8.8 8.9 8.8  MG  --  2.0  --   --   --   --   AST 23 18  --   --   --   --   ALT 12 11  --   --   --   --   ALKPHOS 141* 123*  --   --   --   --   BILITOT 1.9* 1.9*  --   --   --   --    ------------------------------------------------------------------------------------------------------------------ estimated creatinine clearance is 46.3 ml/min (by C-G formula based on Cr of 1.25). ------------------------------------------------------------------------------------------------------------------ No results found for this basename: HGBA1C,  in the last 72 hours ------------------------------------------------------------------------------------------------------------------ No results found for this basename: CHOL, HDL, LDLCALC, TRIG,  CHOLHDL, LDLDIRECT,  in the last 72 hours ------------------------------------------------------------------------------------------------------------------ No results found for this basename: TSH, T4TOTAL, FREET3, T3FREE, THYROIDAB,  in the last 72 hours ------------------------------------------------------------------------------------------------------------------ No results found for this basename: VITAMINB12, FOLATE, FERRITIN, TIBC, IRON, RETICCTPCT,  in the last 72 hours  Coagulation profile  Recent Labs Lab 03/24/13 1840  INR 1.11    No results found for this basename: DDIMER,  in the last 72 hours  Cardiac Enzymes No results found for this basename: CK, CKMB, TROPONINI, MYOGLOBIN,  in the last 168 hours ------------------------------------------------------------------------------------------------------------------ No components found with this basename: POCBNP,

## 2013-03-31 NOTE — Progress Notes (Signed)
NUTRITION FOLLOW-UP  DOCUMENTATION CODES Per approved criteria  -Severe malnutrition in the context of chronic illness   INTERVENTION: Discontinue oral nutrition supplements per patient request. RD to continue to follow nutrition care plan.  NUTRITION DIAGNOSIS: Inadequate oral intake related to GI distress as evidenced by severe weight loss and poor intake. Improving.  Goal: Intake to meet >90% of estimated nutrition needs. Improved.  Monitor:  weight trends, lab trends, I/O's  ASSESSMENT: PMHx significant for CLL s/p L BKA, PVD, CKD stage III, HF. Admitted with R-sided back pain and abdominal pain x 1 week. He also notes that he has had 30-lb unintentional wt loss x 2 months and night sweats. Work-up reveals large liver lesion and R adrenal mass.  Underwent liver mass biopsy on 12/12. Palliative care evaluated patient 12/17 for GOC. Per oncology notes today, pt with hepatocellular carcinoma. Plan is for pt to have MRI to assess for metastasis - MD suspects pt with adrenal metastasis.    Currently ordered for Regular diet. Eating 75%. Pt did not like the vanilla Glucerna. Was given a chocolate Ensure last night and he feels as though that is what made him throw up - pt asking for all oral nutrition supplements to be discontinued. Pt complaining of constipation.  Pt meets criteria for severe MALNUTRITION in the context of chronic illness as evidenced by 19% wt loss x 2 months, severe fat and muscle mass loss and intake of <75% of estimated energy intake x at least 1 month.   Height: Ht Readings from Last 1 Encounters:  03/30/13 5\' 8"  (1.727 m)    Weight: Wt Readings from Last 1 Encounters:  03/30/13 139 lb (63.05 kg)  Admit wt 129 lb - wt elevated  BMI:  Body mass index is 21.14 kg/(m^2). Normal  Estimated Nutritional Needs: Kcal: 1750 - 2000 Protein: at least 75 grams daily Fluid: 1.8 - 2 liters  Skin: abrasions and incisions  Diet Order: General  EDUCATION  NEEDS: -No education needs identified at this time   Intake/Output Summary (Last 24 hours) at 03/31/13 1013 Last data filed at 03/31/13 0951  Gross per 24 hour  Intake   1320 ml  Output    900 ml  Net    420 ml    Last BM: 12/15  Labs:   Recent Labs Lab 03/28/13 0519  03/29/13 2135 03/30/13 0532 03/31/13 0750  NA 143  < > 136 139 131*  K 2.9*  < > 4.6 4.2 4.3  CL 98  < > 100 101 95*  CO2 28  < > 26 30 27   BUN 35*  < > 28* 26* 22  CREATININE 1.59*  < > 1.31 1.33 1.25  CALCIUM 9.1  < > 8.8 8.9 8.8  MG 2.0  --   --   --   --   GLUCOSE 85  < > 93 87 108*  < > = values in this interval not displayed.  CBG (last 3)   Recent Labs  03/31/13 0010 03/31/13 0421 03/31/13 0744  GLUCAP 130* 102* 110*    Scheduled Meds: . acetaminophen  1,000 mg Oral Once  . allopurinol  100 mg Oral Daily  . bisacodyl  5 mg Oral Q1500  . heparin  5,000 Units Subcutaneous Q8H  . insulin aspart  0-9 Units Subcutaneous Q4H  . morphine  45 mg Oral Q12H  . tamsulosin  0.8 mg Oral Daily    Continuous Infusions:  none   Donald Motto MS, RD, LDN  Pager: [phone] After-hours pager: [phone]

## 2013-03-31 NOTE — Progress Notes (Signed)
Progress Note from the Palliative Medicine Team at [facility]  Subjective: We met again today with Donald Dudley, his wife Lurena Joiner, and daughters Talbert Forest and Steward Drone. We had a frank discussion about his poor prognosis with his hepatocellular carcinoma and limited treatment options. He is not a candidate for surgery or chemotherapy. He and his family understand our medical limitations in this circumstance and inability for cure.   We discussed CPR and intubation and Mr. Mowrey decided that he would not want these interventions and would like DNR status. We also discussed his desire for further medical interventions given his poor prognosis. He would like to consider IV fluids on a trial basis and possible use of antibiotics in the future. He would not want a feeding tube. We discussed these options according to his natural disease trajectory and explained that as his disease progresses he will get weaker and will be prone to infection; he will get weak and not desire food/drink. He will become more sleepy. They expressed understanding that IV fluids and antibiotics may not be indicated closer to end of life. His family agrees with these decisions. Hard Choices book was provided.  Their goal is to get him home with hospice support so case management consulted to offer hospice choice. They are interested in considering residential hospice closer to his end of life. We discussed PICC line placement to be utilized for continuous infusion of pain medication within the home with hospice to manage.    Objective: Allergies  Allergen Reactions  . Amiodarone Swelling  . Colchicine Swelling    diarrhea  . Crestor [Rosuvastatin Calcium] Swelling  . Lipitor [Atorvastatin Calcium] Swelling  . Lisinopril Other (See Comments)    Unknown reaction  . Lyrica [Pregabalin] Swelling  . Mevacor [Lovastatin] Swelling    GI-bleed   . Micardis [Telmisartan] Other (See Comments)    Unknown reaction  . Neurontin  [Gabapentin] Swelling  . Norvasc [Amlodipine Besylate] Swelling  . Potassium Chloride Er Other (See Comments)    Unknown reaction  . Ranolazine Er Other (See Comments)    Unknown reaction  . Zocor [Simvastatin - High Dose] Swelling   Scheduled Meds: . acetaminophen  1,000 mg Oral Once  . allopurinol  100 mg Oral Daily  . bisacodyl  5 mg Oral Q1500  . heparin  5,000 Units Subcutaneous Q8H  .  HYDROmorphone (DILAUDID) injection  2 mg Intravenous Q3H  . insulin aspart  0-9 Units Subcutaneous Q4H  . morphine  45 mg Oral Q12H  . 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 CVS: RRR, S1 S2 Abdomen: Soft, NT, ND Ext: Lt BKA old, open sore rt heel, no edema Neuro: A&O, follows commands, able to participate in conversation/decision-making  Labs: CBC    Component  Value Date/Time   WBC 31.6* 03/31/2013 0750   WBC 16.9* 12/06/2012 1414   RBC 7.00* 03/31/2013 0750   RBC 6.21* 12/06/2012 1414   HGB 14.7 03/31/2013 0750   HGB 13.6 12/06/2012 1414   HCT 51.8 03/31/2013 0750   HCT 46.7 12/06/2012 1414   PLT 261 03/31/2013 0750   PLT 214 12/06/2012 1414   MCV 74.0* 03/31/2013 0750   MCV 75.2* 12/06/2012 1414   MCH 21.0* 03/31/2013 0750   MCH 21.9* 12/06/2012 1414   MCHC 28.4* 03/31/2013 0750   MCHC 29.2* 12/06/2012 1414   RDW 20.5* 03/31/2013 0750   RDW 19.2* 12/06/2012 1414   LYMPHSABS 3.0 03/24/2013 1300   LYMPHSABS 0.7*  12/06/2012 1414   MONOABS 0.7 03/24/2013 1300   MONOABS 1.3* 12/06/2012 1414   EOSABS 0.1 03/24/2013 1300   EOSABS 0.2 12/06/2012 1414   BASOSABS 0.1 03/24/2013 1300   BASOSABS 0.3* 12/06/2012 1414    BMET    Component Value Date/Time   NA 131* 03/31/2013 0750   K 4.3 03/31/2013 0750   CL 95* 03/31/2013 0750   CO2 27 03/31/2013 0750   GLUCOSE 108* 03/31/2013 0750   BUN 22 03/31/2013 0750   CREATININE 1.25 03/31/2013 0750   CALCIUM 8.8 03/31/2013 0750   GFRNONAA 55* 03/31/2013 0750   GFRAA 64* 03/31/2013 0750    CMP     Component Value Date/Time   NA 131* 03/31/2013 0750   K 4.3 03/31/2013 0750   CL 95* 03/31/2013 0750   CO2 27 03/31/2013 0750   GLUCOSE 108* 03/31/2013 0750   BUN 22 03/31/2013 0750   CREATININE 1.25 03/31/2013 0750   CALCIUM 8.8 03/31/2013 0750   PROT 5.5* 03/28/2013 0519   ALBUMIN 3.1* 03/28/2013 0519   AST 18 03/28/2013 0519   ALT 11 03/28/2013 0519   ALKPHOS 123* 03/28/2013 0519   BILITOT 1.9* 03/28/2013 0519   GFRNONAA 55* 03/31/2013 0750   GFRAA 64* 03/31/2013 0750      Assessment and Plan: 1. Code Status: DNR 2. Symptom Control: 1. Pain: PICC for use at home with continuous pain infusion. Scheduled Dilaudid for better pain control. Decrease MS Contin to 30mg  bid.  2. Anxiety: Ativan prn. Clarified this is for anxiety and could help with pain and not for depression as he thought. 3. Bowel Regimen: Dulcolax EC daily. Miralax prn. 4. Nausea: Ondansetron prn. 3. Psycho/Social: Emotional support provided to patient and family while difficult decisions were made today. 4. Spiritual: Chaplain following. 5. Disposition: Home with hospice support.  Patient Documents Completed or Given: Document Given Completed  Advanced Directives Pkt    MOST yes yes  DNR    Gone from My Sight    Hard Choices yes     Time In Time Out Total Time Spent with Patient Total Overall Time  1430 1610     Greater than 50%  of this time was spent  counseling and coordinating care related to the above assessment and plan.  Yong Channel, NP Palliative Medicine Team Team Phone # [phone]  Discussed with Dr. Catha Gosselin.  1

## 2013-03-31 NOTE — Progress Notes (Signed)
Peripherally Inserted Central Catheter/Midline Placement  The IV Nurse has discussed with the patient and/or persons authorized to consent for the patient, the purpose of this procedure and the potential benefits and risks involved with this procedure.  The benefits include less needle sticks, lab draws from the catheter and patient may be discharged home with the catheter.  Risks include, but not limited to, infection, bleeding, blood clot (thrombus formation), and puncture of an artery; nerve damage and irregular heat beat.  Alternatives to this procedure were also discussed.  PICC/Midline Placement Documentation        Donald Dudley 03/31/2013, 5:41 PM

## 2013-03-31 NOTE — Progress Notes (Signed)
PT Cancellation Note  Patient Details Name: Donald Dudley MRN: [id number] DOB: 01-19-1941   Cancelled Treatment:    Reason Eval/Treat Not Completed: Pain limiting ability to participate. Pt and family in meeting regarding plan of care. Pt refusing therapy at this time due to pain. When asked how pt was doing on his transfers pt stated " i'm doing about like i was, just fine" . Will re-attempt to see pt at next available time regarding POC and his goals of therapy.    [name] [place], [place] [phone] 03/31/2013, 2:57 PM

## 2013-03-31 NOTE — Progress Notes (Signed)
Chaplain provided provided emotional and spiritual support to patient. Chaplain helped patient discuss issues of ultimate concern. Patient said "he has difficulty maintaining hope that things will get better."  Chaplain listened supportively as patient talked about his wife and daughter. Chaplain provided active/reflective listening for patient's feelings. Chaplain will follow up as needed or requested.   03/31/13 1100  Clinical Encounter Type  Visited With Patient  Visit Type Follow-up;Spiritual support;Social support  Spiritual Encounters  Spiritual Needs Emotional  Stress Factors  Patient Stress Factors Family relationships;Health changes

## 2013-03-31 NOTE — Progress Notes (Signed)
IP PROGRESS NOTE  Subjective:  He reports improved pain at the right leg with the current narcotic regimen. The low-dose oral Dilaudid did not help. The IV Dilaudid does not last 4 hours. He does not have significant abdomen or flank pain at present. He complains of constipation and would like to resume Dulcolax.   Objective: Vital signs in last 24 hours: Blood pressure 127/58, pulse 87, temperature 98.4 F (36.9 C), temperature source Oral, resp. rate 18, height 5\' 8"  (1.727 m), weight 139 lb (63.05 kg), SpO2 100.00%.  Intake/Output from previous day: 12/17 0701 - 12/18 0700 In: 1320 [P.O.:720; I.V.:600] Out: 850 [Urine:850]  Physical Exam:  Abdomen: No hepatomegaly, mild right abdomen tenderness Musculoskeletal: No tenderness at the right flank, the area of pain is near the right iliac Skin: Pressure ulcer at the right heel Neurologic:  Alert, follows commands, the right foot strength appears intact, numbness with light touch at the right foot    Lab Results:  Recent Labs  03/31/13 0750  WBC 31.6*  HGB 14.7  HCT 51.8  PLT 261    BMET  Recent Labs  03/29/13 2135 03/30/13 0532  NA 136 139  K 4.6 4.2  CL 100 101  CO2 26 30  GLUCOSE 93 87  BUN 28* 26*  CREATININE 1.31 1.33  CALCIUM 8.8 8.9   03/25/2013-AFP  1615  Studies/Results: No results found.  Medications: I have reviewed the patient's current medications.  Assessment/Plan:  1. History of polycythemia vera  2. Chronic leukocytosis, neutrophilia, secondary to #1  3. Status post left BKA  4. Atrial fibrillation  5. History of "Sweet syndrome "  6. History of shingles  7. History of sacral decubitus ulcers  8. Right liver mass and right retroperitoneal versus adrenal mass-status post an ultrasound-guided biopsy of the liver 03/25/2013 with the pathology confirming hepatocellular carcinoma  9. abdomen/flank pain-likely secondary to the liver or retroperitoneal mass-not significant at  present  10. Right leg pain and numbness-he appears to have radicular pain in the right leg. He could have metastatic disease involving the lumbar spine or lumbar disc disease     Recommendations:  1. continue narcotic analgesics for pain, I increase the oral Dilaudid dose 2. MRI of the lumbar spine-he had an MRI in 2012  Donald Dudley has been diagnosed with hepatocellular carcinoma. I discussed the diagnosis and treatment options with him today. He is not a candidate for a hepatic resection procedure. 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 However it is not clear whether the hepatocellular carcinoma is related to his pain. He could have metastatic disease at the lumbar spine or a benign process causing the radicular pain.  I reviewed the CT in radiology. He had an MRI in 2012 and they feel he can have an MRI now. I will order the MRI and they will cancel or adjust the contrast dose as indicated based on his renal function.  Discussions regarding hospice can begin if he is confirmed to have metastatic hepatocellular carcinoma. He indicated that he wishes to return home.   LOS: 7 days   Paulene Tayag, Jillyn Hidden  03/31/2013, 8:47 AM

## 2013-04-01 ENCOUNTER — Inpatient Hospital Stay (HOSPITAL_COMMUNITY): Payer: Medicare Other

## 2013-04-01 LAB — GLUCOSE, CAPILLARY
Glucose-Capillary: 106 mg/dL — ABNORMAL HIGH (ref 70–99)
Glucose-Capillary: 107 mg/dL — ABNORMAL HIGH (ref 70–99)
Glucose-Capillary: 124 mg/dL — ABNORMAL HIGH (ref 70–99)
Glucose-Capillary: 92 mg/dL (ref 70–99)
Glucose-Capillary: 97 mg/dL (ref 70–99)

## 2013-04-01 MED ORDER — ONDANSETRON HCL 4 MG PO TABS: 4.0000 mg | ORAL_TABLET | Freq: Four times a day (QID) | ORAL | Status: AC | PRN

## 2013-04-01 MED ORDER — SODIUM CHLORIDE 0.9 % IV SOLN: 0.5000 mg/h | INTRAVENOUS | Status: AC

## 2013-04-01 MED ORDER — SODIUM CHLORIDE 0.9 % IV SOLN
0.5000 mg/h | INTRAVENOUS | Status: DC
  Administered 2013-04-01: 0.5 mg/h via INTRAVENOUS
  Filled 2013-04-01: qty 2.5

## 2013-04-01 NOTE — Progress Notes (Addendum)
Notified by [facility], patient and family request services of Hospcie and Palliative Care of  [facility]) after discharge.  Patient information reviewed with Dr Elliot Gurney, [facility] Medical Director hospice eligible with dx: Hepatocellular cancer.  Spoke with patient, wife Lurena Joiner( h: [phone]) and daughter Larna Daughters (c: [phone]) at bedside to initiate education related to hospice services, philosophy and team approach to care, all voiced good understanding of information provided.  During this visit patient intermittently grimacing adjusting his position in bed reported R leg pain- level 8/10; meds reviewed by PMT provider; patient has not been using Xanax last dose 03/25/13; discussed use of Xanax to work together with pain med to help with anxiety that could also contribute to pain patient voiced understanding and did receive 0.25mg  Xanax at 1541 with good effect   Per notes and discussion with Dr Catha Gosselin plan is to d/c tomorrow, Saturday, once all arrangements are in place -pt to discharge on 0.5mg  Dilaudid per continuous infusion via CADD pump via L PICC line  - [facility] notified of need to arrange for concentrated infusion and pump to be processed and delivered to [id number]  - per instructions from [facility] -needed Rx has been faxed to [facility] infusion rep Attn:Pharmacy/ Debbie Haper    - discussed arrangements for infusion and pump (which are packaged together) to be delivered by 10 am Saturday 12/20 -once medication is on unit, Staff RN will need to notify [facility] at [phone] and request On-Call RN come to connect patient to infusion for discharge   -please send completed GOLD DNR form in shadow chart home with pt  Per discussion with wife/patient plan is to discharge by personal vehicle after patient has been connected to home continuous infusion set up -dtr Steward Drone will transport and patient has his wheelchair and prosthesis in the room  DME needs discussed; per wife patient has a  hospital bed, BSC and wheelchair in the home; no other needs at this time however they have the [facility] DME list and will discuss further with team once patient is at home   Patient's PCP Dr Burton Apley; pharmacy [facility] on [address]  Initial paperwork faxed to [facility]  Please notify [facility] when patient is ready to leave unit at d/c call [phone]   [facility] information and contact numbers also given to wife Lurena Joiner  during visit.   Above information shared with [facility]  Please call with any questions or concerns   Valente David, RN 04/01/2013, 3:09 PM [facility] Liaison [phone]

## 2013-04-01 NOTE — Discharge Summary (Signed)
Physician Discharge Summary  Donald Dudley ZOX:[id number] DOB: 10/15/1940 DOA: 03/24/2013  PCP: Lorenda Peck, MD  Admit date: 03/24/2013 Discharge date: 04/01/2013  Time spent: 45 minutes  Recommendations for Outpatient Follow-up:  Patient should follow up with Dr. Su Hilt and Dr. Truett Perna within one week of discharge.  He will be discharged to home with hospice care.  Discharge Diagnoses:  Principal Problem:   Liver mass Active Problems:   A-fib   HTN (hypertension)   DM2 (diabetes mellitus, type 2)   CKD (chronic kidney disease) stage 3, GFR 30-59 ml/min   Hyponatremia   AKI (acute kidney injury)   Palliative care encounter   Back pain   Hepatocellular carcinoma   Discharge Condition: stable  Diet recommendation: heart healthy  Filed Weights   03/29/13 2039 03/30/13 2002 03/31/13 2200  Weight: 61.78 kg (136 lb 3.2 oz) 63.05 kg (139 lb) 63.005 kg (138 lb 14.4 oz)    History of present illness:  Donald Dudley is a 72 y.o. male  72 year old with past medical history of CLL status post BKA on the left, peripheral vascular disease, chronic kidney disease stage III, Systolic heart failure, poor historian comes in for onset of right sided back pain and abdominal pain this started a week prior to admission. He relates his narcotics have been helping with the pain. Recently the pain medication has lost its effect. He relates he's had intermittent episodes like this 2 months prior to admission but didn't pay much attention. He relates some night sweats and unintentional 30 pound weight loss in about 2 months. He relates he can't lay flat on his back as the pain is unbearable. It feels better when he is on his left side.   Hospital Course:  This is a 72 year old male with a history of CLL, left BKA, PVD, and CKD stage III, that presented to the hospital with right sided abdominal pain.  Upon admission he was noted to have mild hyperkalemia, hyponatremia and  hypochloremia with elevated creatinine, leukocytosis of 49.  CT of the abdomen noted a 6.3cm mass in the right hepatic lobe which was suspicious for malignancy.  He was also noted to have an elevated AFP, GI was consulted, and patient has a negative workup for hepatitis.   Interventional radiology was consulted for liver biopsy which did show hepatocellular carcinoma.  Dr. Truett Perna, oncologist, was also consulted for recommendations.  Patient was to undergo a MRI of the lumbar area for his continued back pain, and possibility of metastases.  However, patient was unable to lay flat for the MRI.  Dr. Truett Perna recommended an xray of the hips, which was negative.  Patient may have an open MRI as an outpatient or PET scan for further workup of his cancer.  After discussion with palliative care and Dr. Truett Perna, patient will be discharged with hospice.    As for patient's hyponatremia, this was likely due to hypovolemia.  Patient was noted to have acute on chronic kidney disease, stage 3.  Nephrotoxic agents were held, and his creatinine did improve.  He was continued on his betablocker for hypertension, but lasix was held due to acute kidney injury.  Patient did have some episodes of hypoglycemia, and his lantus was held.  He was encouraged to increase his oral intake as patient has had a poor appetite. He was placed on a D10 drip, however, this was discontinued and patient's hypoglycemia resolved.  He was also noted to have episodes of hypotension, however this  was secondary to hypovolemia.  His leukocytosis was thought to be secondary to polycythemia.  Patient remained afebrile and did not have signs of infection during his hospital course.  His hypokalemia also resolved and was thought to be secondary to diuretic use.    Again patient will be discharged to home with hospice.  This has been discussed with the patient and his wife, they do understand and agree.  Patient will need to follow up with Dr. Su Hilt and  Dr. Truett Perna.    Procedures: -Liver biopsy conducted on 12/12 by IR   Consultations: IR  GI  Oncology  Palliative care  Discharge Exam: Filed Vitals:   04/01/13 0936  BP: 111/68  Pulse: 91  Temp: 99 F (37.2 C)  Resp: 17   Exam  General: Well developed, malnourished, NAD, appears stated age  HEENT: NCAT, PERRLA, mucous membranes moist.  Neck: Supple, no JVD, no masses  Cardiovascular: S1 S2 auscultated, no rubs, murmurs or gallops. Regular rate and rhythm.  Respiratory: Clear to auscultation bilaterally with equal chest rise  Abdomen: Soft, nontender, nondistended, + bowel sounds  Extremities: LBKA, no clubbing or cyanosis otherwise noted  Neuro: AAOx3, cranial nerves grossly intact.  Skin: Without rashes exudates or nodules  Psych: Depressed affect and demeanor with intact judgement and insight  Discharge Instructions  Discharge Orders   Future Appointments Provider Department Dept Phone   06/06/2013 2:45 PM [facility] [phone]   06/06/2013 3:15 PM Rana Snare, NP [facility] [phone]   Future Orders Complete By Expires   Diet - low sodium heart healthy  As directed    Discharge instructions  As directed    Comments:     Patient should follow up with Dr. Su Hilt and Dr. Truett Perna within one week of discharge.  He will be discharged to home with hospice care.   Increase activity slowly  As directed        Medication List    STOP taking these medications       HYDROcodone-acetaminophen 10-325 MG per tablet  Commonly known as:  NORCO     torsemide 20 MG tablet  Commonly known as:  DEMADEX     triamterene-hydrochlorothiazide 75-50 MG per tablet  Commonly known as:  MAXZIDE      TAKE these medications       allopurinol 100 MG tablet  Commonly known as:  ZYLOPRIM  Take 100 mg by mouth daily.     ALPRAZolam 0.25 MG tablet  Commonly known as:  XANAX  Take 0.25 mg by  mouth every 4 (four) hours as needed for anxiety.     atenolol 50 MG tablet  Commonly known as:  TENORMIN  Take 50 mg by mouth daily.     bisacodyl 5 MG EC tablet  Generic drug:  bisacodyl  Take 5 mg by mouth daily as needed. For constipation     FLOMAX 0.4 MG Caps capsule  Generic drug:  tamsulosin  Take 0.8 mg by mouth daily.     insulin NPH-regular (70-30) 100 UNIT/ML injection  Commonly known as:  NOVOLIN 70/30  Inject 20-58 Units into the skin 2 (two) times daily. Sliding scale     isosorbide mononitrate 30 MG 24 hr tablet  Commonly known as:  IMDUR  Take 30 mg by mouth every evening.     methocarbamol 500 MG tablet  Commonly known as:  ROBAXIN  Take 500 mg by mouth every  6 (six) hours as needed for muscle spasms.     ondansetron 4 MG tablet  Commonly known as:  ZOFRAN  Take 1 tablet (4 mg total) by mouth every 6 (six) hours as needed for nausea.       Allergies  Allergen Reactions  . Amiodarone Swelling  . Colchicine Swelling    diarrhea  . Crestor [Rosuvastatin Calcium] Swelling  . Lipitor [Atorvastatin Calcium] Swelling  . Lisinopril Other (See Comments)    Unknown reaction  . Lyrica [Pregabalin] Swelling  . Mevacor [Lovastatin] Swelling    GI-bleed   . Micardis [Telmisartan] Other (See Comments)    Unknown reaction  . Neurontin [Gabapentin] Swelling  . Norvasc [Amlodipine Besylate] Swelling  . Potassium Chloride Er Other (See Comments)    Unknown reaction  . Ranolazine Er Other (See Comments)    Unknown reaction  . Zocor [Simvastatin - High Dose] Swelling       Follow-up Information   Follow up with ROBERTS, Vernie Ammons, MD. Schedule an appointment as soon as possible for a visit in 1 week.   Specialty:  Internal Medicine   Contact information:   [address] [place] [address] [phone]       Follow up with [name], MD. Schedule an appointment as soon as possible for a visit in 1 week.   Specialty:  Oncology    Contact information:   [address] [place] [address] [phone]        The results of significant diagnostics from this hospitalization (including imaging, microbiology, ancillary and laboratory) are listed below for reference.    Significant Diagnostic Studies: Ct Abdomen Pelvis Wo Contrast  03/24/2013   CLINICAL DATA:  Back pain  EXAM: CT ABDOMEN AND PELVIS WITHOUT CONTRAST  TECHNIQUE: Multidetector CT imaging of the abdomen and pelvis was performed following the standard protocol without intravenous contrast.  COMPARISON:  None.  FINDINGS: Sagittal images of the spine shows no destructive bony lesions. Mild disc space flattening with mild anterior spurring at L2-L3 level.  The study is markedly limited without IV contrast. Lung bases are unremarkable. There is heterogeneous low density lesion posterior aspect of the right hepatic lobe measures at least 6 cm. This is highly suspicious for primary or secondary malignancy. Further correlation with enhanced study is recommended. Extensive atherosclerotic calcifications abdominal aorta, bilateral proximal renal artery, SMA, splenic artery and iliac arteries are noted. No aortic aneurysm. Probable stent in right renal artery.  There is right adrenal lesion measures 6.7 by 3.7 cm. This is highly suspicious for primary or secondary malignancy. Further correlation with enhanced MRI is recommended.  The patient is status post cholecystectomy. A 2nd low-density lesion within right hepatic lobe anteriorly measures 2 cm. Unenhanced spleen is unremarkable. Mild pancreatic atrophy. The left adrenal gland is unremarkable.  Kidneys shows bilateral cortical thinning. No nephrolithiasis. No hydronephrosis or hydroureter. No small bowel obstruction. Mild distended urinary bladder. Prostate gland measures 3.7 x 4.2 cm. Atherosclerotic calcifications of femoral arteries. No calcified calculi are noted within urinary bladder. No ureteral calculi are  identified.  IMPRESSION: 1. There is low density lesion posterior aspect of right hepatic lobe measures 6.3 cm highly suspicious for primary or secondary malignancy. Further assessment with enhanced study is recommended. There is right adrenal lesion measures 6.7 x 3.7 cm. Primary or secondary malignancy is suspected. Further evaluation is recommended. 2. Extensive atherosclerotic vascular calcifications. 3. No hydronephrosis or hydroureter. 4. Mild distended urinary bladder. These results were called  by telephone at the time of interpretation on 03/24/2013 at 2:34 PM to Dr. Blane Ohara , who verbally acknowledged these results. Mild enlarged prostate gland.   Electronically Signed   By: Natasha Mead M.D.   On: 03/24/2013 14:35   Dg Chest 1 View  03/31/2013   CLINICAL DATA:  Central catheter placement  EXAM: CHEST - 1 VIEW  COMPARISON:  Chest radiograph April 09, 2012 and chest CT March 24, 2013  FINDINGS: Central catheter tip is in the superior vena cava near the cavoatrial junction. No pneumothorax.  There is a minimal left effusion. The lungs are otherwise clear. The heart is moderately enlarged with normal pulmonary vascularity. No adenopathy. There is atherosclerotic change in aorta. The patient is status post internal mammary bypass grafting.  IMPRESSION:  Central catheter tip in superior vena cava. No pneumothorax. No edema or consolidation. Minimal left effusion. Moderate cardiac enlargement.   Electronically Signed   By: Bretta Bang M.D.   On: 03/31/2013 19:06   Dg Hip Bilateral W/pelvis  04/01/2013   CLINICAL DATA:  Bilateral hip pain  EXAM: BILATERAL HIP WITH PELVIS - 4+ VIEW  COMPARISON:  None.  FINDINGS: There is no fracture or dislocation. The SI joints are unremarkable.  There is atherosclerotic disease present.  There is a moderate there is contrast present within the sigmoid colon and rectum.  IMPRESSION: No acute osseous injury of the hips.   Electronically Signed   By: Elige Ko   On: 04/01/2013 11:43   Ct Chest Wo Contrast  03/24/2013   CLINICAL DATA:  Abnormal finding on the abdomen and pelvis CT. A low-density liver lesion suggests a primary versus metastatic neoplasm. Large adrenal mass on the right also evident on the CT. Patient has no chest complaints.  EXAM: CT CHEST WITHOUT CONTRAST  TECHNIQUE: Multidetector CT imaging of the chest was performed following the standard protocol without IV contrast.  COMPARISON:  Abdomen and pelvis CT, 03/24/2013  FINDINGS: There are multiple bilateral calcified granulomas. There is no lung nodule or mass to suggest a primary lung malignancy as the source for the liver and adrenal lesions. There are changes of moderate centrilobular emphysema. Minor subsegmental atelectasis is noted at the bases. There is no consolidation or edema. No pleural effusion or pneumothorax is seen.  The heart is mildly enlarged. There are dense coronary artery calcifications. No mediastinal or hilar masses or pathologically enlarged lymph nodes are appreciated.  There are no osteoblastic or osteolytic lesions.  IMPRESSION: 1. No lung mass or significant nodule is seen to suggest a primary lung malignancy as a source of the liver or adrenal lesions. There is no evidence of lung metastatic disease. 2. Multiple bilateral small calcified lung granulomas are noted. 3. There are changes of emphysema, but no acute findings in the lungs. 4. No mediastinal or hilar masses or enlarged lymph nodes.   Electronically Signed   By: Amie Portland M.D.   On: 03/24/2013 20:16   US Biopsy  03/25/2013   CLINICAL DATA:  Large right liver mass, adrenal mass, unknown primary  EXAM: ULTRASOUND GUIDED CORE BIOPSY OF RIGHT LIVER MASS  MEDICATIONS: 2 mg IV Versed; 100 mcg IV Fentanyl  Total Moderate Sedation Time: 15 MIN  PROCEDURE: The procedure, risks, benefits, and alternatives were explained to the patient. Questions regarding the procedure were encouraged and answered. The patient  understands and consents to the procedure.  The right upper quadrant was prepped with Betadine in a sterile fashion, and a  sterile drape was applied covering the operative field. A sterile gown and sterile gloves were used for the procedure. Local anesthesia was provided with 1% Lidocaine.  Previous imaging reviewed. Preliminary ultrasound performed of the right upper quadrant. Large right liver mass demonstrated. Under sterile conditions and local anesthesia, a 17 gauge 6.8 cm access needle was advanced percutaneously into the liver lesion. Needle position confirmed with ultrasound. Images obtained for documentation. 2 18 gauge 3 cm core biopsies obtained. These were placed in formalin. Needle tract embolized with Gel-Foam. No immediate complication. Patient tolerated the procedure well.  COMPLICATIONS: None.  FINDINGS: Imaging confirms needle placement in the large right liver mass.  IMPRESSION: Successful ultrasound large right liver mass 18 gauge core biopsy   Electronically Signed   By: Ruel Favors M.D.   On: 03/25/2013 14:36    Microbiology: No results found for this or any previous visit (from the past 240 hour(s)).   Labs: Basic Metabolic Panel:  Recent Labs Lab 03/28/13 0519 03/29/13 1002 03/29/13 2135 03/30/13 0532 03/31/13 0750  NA 143 140 136 139 131*  K 2.9* 3.1* 4.6 4.2 4.3  CL 98 101 100 101 95*  CO2 28 30 26 30 27   GLUCOSE 85 130* 93 87 108*  BUN 35* 28* 28* 26* 22  CREATININE 1.59* 1.37* 1.31 1.33 1.25  CALCIUM 9.1 9.2 8.8 8.9 8.8  MG 2.0  --   --   --   --    Liver Function Tests:  Recent Labs Lab 03/28/13 0519  AST 18  ALT 11  ALKPHOS 123*  BILITOT 1.9*  PROT 5.5*  ALBUMIN 3.1*   No results found for this basename: LIPASE, AMYLASE,  in the last 168 hours No results found for this basename: AMMONIA,  in the last 168 hours CBC:  Recent Labs Lab 03/28/13 0519 03/31/13 0750  WBC 29.9* 31.6*  HGB 15.2 14.7  HCT 54.2* 51.8  MCV 72.8* 74.0*  PLT 284 261    Cardiac Enzymes: No results found for this basename: CKTOTAL, CKMB, CKMBINDEX, TROPONINI,  in the last 168 hours BNP: BNP (last 3 results) No results found for this basename: PROBNP,  in the last 8760 hours CBG:  Recent Labs Lab 03/31/13 2202 04/01/13 0008 04/01/13 0407 04/01/13 0748 04/01/13 1145  GLUCAP 109* 106* 107* 106* 92       Signed:  Amarea Macdowell  [facility] 04/01/2013, 2:36 PM

## 2013-04-01 NOTE — Progress Notes (Signed)
PT Cancellation Note  Patient Details Name: Donald Dudley MRN: [id number] DOB: 1940/09/02   Cancelled Treatment:     Spoke with pt and family at this time. No acute PT needs. Pt and family feel comfortable with mobility and transfer level. No DME needs at this time. Will sign off on PT at this time.    [name] [place],  [phone] 04/01/2013, 2:40 PM

## 2013-04-01 NOTE — Progress Notes (Signed)
[facility]  Patient Status:   New pt for [facility] this admission  [facility] is providing the following services: [facility] will provide [facility] services and DME for pt to support DC to home.  [facility] will provide Dilaudid via CADD pump.  [facility] plans to deliver to hospital room on 04/02/13 by 10 AM as confirmed with Valente David, RN with [facility].  [facility] on call RN will connect pt to pump prior to DC home.   If patient discharges after hours, please call [phone].   Sedalia Muta 04/01/2013, 4:31 PM

## 2013-04-01 NOTE — Progress Notes (Signed)
Progress Note from the Palliative Medicine Team at [facility]  Subjective: Sat down with Donald Dudley and his wife Donald Dudley again this morning. He says that he could not tolerate MRI and became claustrophobic during the test. He is interested in finding a cause for this pain that he has dealt with these past 2 years. He told us that he does not want radiation therapy and that he knew someone who had radiation and he saw what she went through with her treatment and because of that he has said he would never want that. We discussed further diagnostic testing for his back/leg pain and he is interested in answers but understands and agrees that we should focus on controlling his pain because intervention for the cause of his pain would not contribute to his goals of comfort and to alleviate suffering, especially in the setting of his poor prognosis. He says he is "happy with our plan for his pain" and that it is helping. Their hope is to return home with the support of hospice. Dr. Burton Apley is his PCP and has a long-standing history with Donald Dudley according to Dr. Truett Perna. Case management and hospice may like to coordinate with Dr. Su Hilt for future medical management plan.  We also discussed the changes to his pain medication and the effectiveness. He says that his pain is much better today after the changes. I asked him specifically how long his pain is controlled with the Dilauidid 2mg  and he told me his pain is very controlled and tolerable for 2 hours. I told him that we are happy the pain is better but that we can continue to adjust because we can make it better. He has episodes of grimacing and frequently adjusts during our conversation but says that his pain is controlled right now. We will continue with our plan to initiate low dose Dilaudid IV infusion for better pain control. He denies any anxiety.   Objective: Allergies  Allergen Reactions  . Amiodarone Swelling  . Colchicine  Swelling    diarrhea  . Crestor [Rosuvastatin Calcium] Swelling  . Lipitor [Atorvastatin Calcium] Swelling  . Lisinopril Other (See Comments)    Unknown reaction  . Lyrica [Pregabalin] Swelling  . Mevacor [Lovastatin] Swelling    GI-bleed   . Micardis [Telmisartan] Other (See Comments)    Unknown reaction  . Neurontin [Gabapentin] Swelling  . Norvasc [Amlodipine Besylate] Swelling  . Potassium Chloride Er Other (See Comments)    Unknown reaction  . Ranolazine Er Other (See Comments)    Unknown reaction  . Zocor [Simvastatin - High Dose] Swelling   Scheduled Meds: . acetaminophen  1,000 mg Oral Once  . allopurinol  100 mg Oral Daily  . bisacodyl  5 mg Oral Q1500  . heparin  5,000 Units Subcutaneous Q8H  .  HYDROmorphone (DILAUDID) injection  2 mg Intravenous Q3H  . insulin aspart  0-9 Units Subcutaneous Q4H  . LORazepam  0.5 mg Intravenous Once  . morphine  30 mg Oral Q12H  . tamsulosin  0.8 mg Oral Daily   Continuous Infusions:  PRN Meds:.ALPRAZolam, bisacodyl, ondansetron (ZOFRAN) IV, ondansetron, polyethylene glycol, sodium chloride  BP 111/68  Pulse 91  Temp(Src) 99 F (37.2 C) (Oral)  Resp 17  Ht 5\' 8"  (1.727 m)  Wt 63.005 kg (138 lb 14.4 oz)  BMI 21.12 kg/m2  SpO2 95%   PPS: 40%  Pain Score: 5/10 Pain Location: back/leg   Intake/Output Summary (Last 24 hours) at 04/01/13 1054 Last data  filed at 04/01/13 0937  Gross per 24 hour  Intake    540 ml  Output    450 ml  Net     90 ml      LBM: 03/28/13      Physical Exam:  General: NAD, occasional grimace, thin HEENT: [place]/AT, no JVD, moist mucous membranes Chest: Symmetric chest rise, no accessory muscle usage CVS: RRR Abdomen: Soft, NT, ND Ext: 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, open would Rt heel Neuro: A&O, follows commands, appropriate conversation  Labs: CBC    Component Value Date/Time   WBC 31.6* 03/31/2013 0750   WBC 16.9* 12/06/2012 1414   RBC 7.00* 03/31/2013 0750   RBC 6.21* 12/06/2012 1414   HGB 14.7  03/31/2013 0750   HGB 13.6 12/06/2012 1414   HCT 51.8 03/31/2013 0750   HCT 46.7 12/06/2012 1414   PLT 261 03/31/2013 0750   PLT 214 12/06/2012 1414   MCV 74.0* 03/31/2013 0750   MCV 75.2* 12/06/2012 1414   MCH 21.0* 03/31/2013 0750   MCH 21.9* 12/06/2012 1414   MCHC 28.4* 03/31/2013 0750   MCHC 29.2* 12/06/2012 1414   RDW 20.5* 03/31/2013 0750   RDW 19.2* 12/06/2012 1414   LYMPHSABS 3.0 03/24/2013 1300   LYMPHSABS 0.7* 12/06/2012 1414   MONOABS 0.7 03/24/2013 1300   MONOABS 1.3* 12/06/2012 1414   EOSABS 0.1 03/24/2013 1300   EOSABS 0.2 12/06/2012 1414   BASOSABS 0.1 03/24/2013 1300   BASOSABS 0.3* 12/06/2012 1414    BMET    Component Value Date/Time   NA 131* 03/31/2013 0750   K 4.3 03/31/2013 0750   CL 95* 03/31/2013 0750   CO2 27 03/31/2013 0750   GLUCOSE 108* 03/31/2013 0750   BUN 22 03/31/2013 0750   CREATININE 1.25 03/31/2013 0750   CALCIUM 8.8 03/31/2013 0750   GFRNONAA 55* 03/31/2013 0750   GFRAA 64* 03/31/2013 0750    CMP     Component Value Date/Time   NA 131* 03/31/2013 0750   K 4.3 03/31/2013 0750   CL 95* 03/31/2013 0750   CO2 27 03/31/2013 0750   GLUCOSE 108* 03/31/2013 0750   BUN 22 03/31/2013 0750   CREATININE 1.25 03/31/2013 0750   CALCIUM 8.8 03/31/2013 0750   PROT 5.5* 03/28/2013 0519   ALBUMIN 3.1* 03/28/2013 0519   AST 18 03/28/2013 0519   ALT 11 03/28/2013 0519   ALKPHOS 123* 03/28/2013 0519   BILITOT 1.9* 03/28/2013 0519   GFRNONAA 55* 03/31/2013 0750   GFRAA 64* 03/31/2013 0750      Assessment and Plan: 1. Code Status: DNR 2. Symptom Control: 1. Pain: D/C MS Contin. D/C Dilaudid scheduled. Initiate low dose Dilaudid continuous infusion for better/consistent pain control. 2. Anxiety: Xanax prn. 3. Bowel Regimen: Dulcolax EC daily. Miralax prn. 3. Psycho/Social: Spent much time today clarifying plans and supporting Donald Dudley and Donald Dudley emotionally through this process.  4. Spiritual: Chaplain following. 5. Disposition: Home with  hospice support.    Time In Time Out Total Time Spent with Patient Total Overall Time  1020 1140     Greater than 50%  of this time was spent counseling and coordinating care related to the above assessment and plan.  Yong Channel, NP Palliative Medicine Team Team Phone # [phone]  Discussed plan with Dr. Catha Gosselin and Dr. Truett Perna.  1

## 2013-04-01 NOTE — Consult Note (Signed)
I have reviewed and discussed the care of this patient in detail with the nurse practitioner including pertinent patient records, physical exam findings and data. I agree with details of this encounter.  

## 2013-04-01 NOTE — Progress Notes (Signed)
I have reviewed and discussed the care of this patient in detail with the nurse practitioner including pertinent patient records, physical exam findings and data. I agree with details of this encounter.  

## 2013-04-01 NOTE — Progress Notes (Signed)
Pt continues to state that pain is not controlled. Palliative team notified. Will continue to monitor.

## 2013-04-01 NOTE — Progress Notes (Signed)
IP PROGRESS NOTE  Subjective:  He continues to have pain at the right leg. He reports this pain has been present for greater than one year. He was unable to tolerate the MRI secondary to claustrophobia.   Objective: Vital signs in last 24 hours: Blood pressure 111/63, pulse 89, temperature 98.9 F (37.2 C), temperature source Oral, resp. rate 18, height 5\' 8"  (1.727 m), weight 138 lb 14.4 oz (63.005 kg), SpO2 95.00%.  Intake/Output from previous day: 12/18 0701 - 12/19 0700 In: 540 [P.O.:540] Out: 500 [Urine:500]  Physical Exam:  Abdomen: No hepatomegaly, mild right abdomen tenderness Musculoskeletal: Pain with motion at the right hip, tenderness at the upper right medial thigh. No mass. Skin: Pressure ulcer at the right heel Neurologic:  Alert, follows commands, the right foot strength appears intact, numbness with light touch at the right foot    Lab Results:  Recent Labs  03/31/13 0750  WBC 31.6*  HGB 14.7  HCT 51.8  PLT 261    BMET  Recent Labs  03/30/13 0532 03/31/13 0750  NA 139 131*  K 4.2 4.3  CL 101 95*  CO2 30 27  GLUCOSE 87 108*  BUN 26* 22  CREATININE 1.33 1.25  CALCIUM 8.9 8.8   03/25/2013-AFP  1615  Studies/Results: Dg Chest 1 View  03/31/2013   CLINICAL DATA:  Central catheter placement  EXAM: CHEST - 1 VIEW  COMPARISON:  Chest radiograph April 09, 2012 and chest CT March 24, 2013  FINDINGS: Central catheter tip is in the superior vena cava near the cavoatrial junction. No pneumothorax.  There is a minimal left effusion. The lungs are otherwise clear. The heart is moderately enlarged with normal pulmonary vascularity. No adenopathy. There is atherosclerotic change in aorta. The patient is status post internal mammary bypass grafting.  IMPRESSION:  Central catheter tip in superior vena cava. No pneumothorax. No edema or consolidation. Minimal left effusion. Moderate cardiac enlargement.   Electronically Signed   By: Bretta Bang M.D.    On: 03/31/2013 19:06    Medications: I have reviewed the patient's current medications.  Assessment/Plan:  1. History of polycythemia vera  2. Chronic leukocytosis, neutrophilia, secondary to #1  3. Status post left BKA  4. Atrial fibrillation  5. History of "Sweet syndrome "  6. History of shingles  7. History of sacral decubitus ulcers  8. Right liver mass and right retroperitoneal versus adrenal mass-status post an ultrasound-guided biopsy of the liver 03/25/2013 with the pathology confirming hepatocellular carcinoma  9. abdomen/flank pain-likely secondary to the liver or retroperitoneal mass-not significant at present  10. Right leg pain and numbness-etiology unclear, persistent  11. Right heel ulcer    Recommendations:  1. continue narcotic analgesics for pain. I am not sure he requires IV narcotics 2. plain x-rays of the pelvis and right hip/femur, outpatient MRI of the lumbar spine and pelvis if the plain x-rays are nondiagnostic  [name] has been diagnosed with hepatocellular carcinoma. His chief complaint continues to be severe pain at the right leg. The etiology of this pain is unclear. The pain may be unrelated to the hepatocellular carcinoma.  [name] has multiple comorbid conditions. I agree he is not a candidate for a hepatic resection procedure. However he may be a candidate for an intervention if he were found to have lumbar disc disease causing radicular pain. He would also be a candidate for palliative radiation to bone metastases.  I will be out until 04/04/2013. Please call oncology as needed.  I will be glad to see him in followup. I discussed the case with Dr.Mikhail.  LOS: 8 days   [name]  04/01/2013, 9:05 AM

## 2013-04-01 NOTE — Care Management Note (Addendum)
   CARE MANAGEMENT NOTE 04/01/2013  Patient:  Donald Dudley, Donald Dudley   Account Number:  [id number]  Date Initiated:  03/29/2013  Documentation initiated by:  Johny Shock  Subjective/Objective Assessment:   Liver mass with bx 03/25/13 await results     Action/Plan:   Oncologist to see pt today, hopefully d/c in next 24-48hr. CBG 13 and pt with  [facility].  04/01/2013 Pt to d/c with Hospice Service see note below.   Anticipated DC Date:  04/01/2013   Anticipated DC Plan:  HOME W HOSPICE CARE         Choice offered to / List presented to:          [facility] arranged  HH-1 RN  HH-4 NURSE'S AIDE  HH-6 SOCIAL WORKER      HH agency  [facility]   Status of service:  Completed, signed off Medicare Important Message given?   (If response is "NO", the following Medicare IM given date fields will be blank) Date Medicare IM given:   Date Additional Medicare IM given:    Discharge Disposition:  HOME W HOSPICE CARE  Per UR Regulation:    If discussed at Long Length of Stay Meetings, dates discussed:    Comments:  04/01/2013 Met with pt and wife re Hospice services, they selected [facility], Lesia Hausen, RN represent from [facility] notified and will see pt @ 1230. Pt states that he has DME and transportation, discussed other transportation modes, pt given phone number for [facility] Johny Shock RN MPH, case manager, [phone]

## 2013-04-02 LAB — GLUCOSE, CAPILLARY: Glucose-Capillary: 95 mg/dL (ref 70–99)

## 2013-04-02 NOTE — Progress Notes (Signed)
Patient discharged home. Remainder of IV Dilaudid was wasted; a total of 30 mL was wasted. Waste was witnessed by myself and Aquilla Solian, RN.

## 2013-04-02 NOTE — Progress Notes (Addendum)
Patient discharged to home with Dilaudid drip via Left upper arm PICC. Home Health nurse facilitated Dilaudid drip set-up and patient teaching prior to patient's discharge. Patient AVS reviewed with patient and family. Patient verbalized understanding of medications and follow-up appointments.  Patient remains stable; no signs or symptoms of distress.  Educated to return to the ER in cases of SOB, dizziness, fever, chest pain, or fainting.

## 2013-04-02 NOTE — Progress Notes (Signed)
CADD pump start visit [facility] Rm [id number] Erskine Speed [facility] RN visit Justin Mend RN  Patient will be admitted into hospice services today after discharge from the hospital. Patient is alert and oriented and just waiting on pump to be discharged. Dilaudid PCA pump started at 0.5mg /hr through right PICC line. PICC flushed with 10ml NS. Admission nurse will be out to see patient when he arrives at home.   Please call with any questions to [facility] [phone] and ask for on-call or [facility] liaison. Ulis Rias RN [facility]

## 2013-04-02 NOTE — Progress Notes (Signed)
[facility]                                                                                Patient Demographics  Donald Dudley, is a 72 y.o. male, DOB - [birth year], JYN:[id number]  Admit date - 03/24/2013   Admitting Physician Marinda Elk, MD  Outpatient Primary MD for the patient is ROBERTS, Vernie Ammons, MD  LOS - 9   Chief Complaint  Patient presents with  . Back Pain  . Abdominal Pain        Assessment & Plan   Principal Problem:   Liver mass Active Problems:   A-fib   HTN (hypertension)   DM2 (diabetes mellitus, type 2)   CKD (chronic kidney disease) stage 3, GFR 30-59 ml/min   Hyponatremia   AKI (acute kidney injury)   Palliative care encounter   Back pain   Hepatocellular carcinoma  Hepatocellular carcinoma/ Liver mass -Patient underwent liver biopsy by IR on 1212, showing hepatocellular carcinoma -AFP was elevated, GI was consulted, hepatitis serology negative -Oncology, Dr. Truett Perna consulted and following, not a candidate for resection -Patient will be discharged to home with Dilaudid via CADD pump  Back/Leg pain -Possible related to metastases -Patient unable to tolerate MRI -Hip x-ray was negative   Hyponatremia -Improved likely due to hypovolemia  Acute on chronic kidney disease, stage III -Likely prerenal with prerenal ischemia -Hold nephrotoxic agents -Creatinine trending downward 1.25  Hypertension -Currently stable, diuretics on hold, continue beta blocker as an outpatient  Diabetes mellitus type 2 with episodes of hypoglycemia -Lantus held CBG (last 3)   Recent Labs  04/01/13 2356 04/02/13 0421 04/02/13 0756  GLUCAP 124* 95 95   Hypotension -Resolved, likely secondary to dehydration  Leukocytosis -Likely secondary to polycythemia, currently stable, afebrile with no signs of infection  Hypokalemia Resolved  Code Status: Full  Family Communication: Wife.  Disposition Plan: Patient was to be discharged on  04/01/2013 however patient will be discharged today 04/02/2013 as we were awaiting for CADD pump to be delivered.  Procedures  -Liver biopsy conducted on 12/12 by IR  Consults   IR GI Oncology Palliative care  DVT Prophylaxis  SCDs  Lab Results  Component Value Date   PLT 261 03/31/2013    Medications  Scheduled Meds: . acetaminophen  1,000 mg Oral Once  . allopurinol  100 mg Oral Daily  . bisacodyl  5 mg Oral Q1500  . heparin  5,000 Units Subcutaneous Q8H  . insulin aspart  0-9 Units Subcutaneous Q4H  . LORazepam  0.5 mg Intravenous Once  . tamsulosin  0.8 mg Oral Daily   Continuous Infusions: . HYDROmorphone 0.5 mg/hr (04/01/13 1301)   PRN Meds:.ALPRAZolam, bisacodyl, ondansetron (ZOFRAN) IV, ondansetron, polyethylene glycol, sodium chloride  Antibiotics    Anti-infectives   None     Time Spent in minutes   30 minutes  Shemicka Cohrs D.O. on 04/02/2013 at 11:10 AM  Between 7am to 7pm - Pager - [phone]  After 7pm go to [email or web address] - password [email or web address]  And look for the night coverage person covering for me after hours  [facility] Office  [phone]    Subjective:   Donald Dudley  seen and examined today.  Patient has no complaints today and states he is very comfortable.  Objective:   Filed Vitals:   04/01/13 1716 04/01/13 2008 04/02/13 0427 04/02/13 0900  BP: 119/64 120/57 132/68 132/75  Pulse: 80 79 89 93  Temp: 98.8 F (37.1 C) 98.2 F (36.8 C) 98.5 F (36.9 C) 98.1 F (36.7 C)  TempSrc: Oral Oral Oral Oral  Resp: 20 19 19 18   Height:      Weight:  64.32 kg (141 lb 12.8 oz)    SpO2: 94% 97% 97% 97%    Wt Readings from Last 3 Encounters:  04/01/13 64.32 kg (141 lb 12.8 oz)  12/10/12 75.8 kg (167 lb 1.7 oz)  12/06/12 73.301 kg (161 lb 9.6 oz)     Intake/Output Summary (Last 24 hours) at 04/02/13 1110 Last data filed at 04/02/13 0427  Gross per 24 hour  Intake    240 ml  Output    450 ml  Net   -210 ml     Exam  General: Well developed, malnourished, NAD, appears stated age  HEENT: NCAT, PERRLA,  mucous membranes moist.   Neck: Supple, no JVD, no masses  Cardiovascular: S1 S2 auscultated, no rubs, murmurs or gallops. Regular rate and rhythm.  Respiratory: Clear to auscultation bilaterally with equal chest rise  Abdomen: Soft, nontender, nondistended, + bowel sounds  Extremities: LBKA, no clubbing or cyanosis otherwise noted  Neuro: AAOx3, cranial nerves grossly intact.   Skin: Without rashes exudates or nodules  Psych: Depressed affect and demeanor with intact judgement and insight  Data Review   Micro Results No results found for this or any previous visit (from the past 240 hour(s)).  Radiology Reports Ct Abdomen Pelvis Wo Contrast  03/24/2013   CLINICAL DATA:  Back pain  EXAM: CT ABDOMEN AND PELVIS WITHOUT CONTRAST  TECHNIQUE: Multidetector CT imaging of the abdomen and pelvis was performed following the standard protocol without intravenous contrast.  COMPARISON:  None.  FINDINGS: Sagittal images of the spine shows no destructive bony lesions. Mild disc space flattening with mild anterior spurring at L2-L3 level.  The study is markedly limited without IV contrast. Lung bases are unremarkable. There is heterogeneous low density lesion posterior aspect of the right hepatic lobe measures at least 6 cm. This is highly suspicious for primary or secondary malignancy. Further correlation with enhanced study is recommended. Extensive atherosclerotic calcifications abdominal aorta, bilateral proximal renal artery, SMA, splenic artery and iliac arteries are noted. No aortic aneurysm. Probable stent in right renal artery.  There is right adrenal lesion measures 6.7 by 3.7 cm. This is highly suspicious for primary or secondary malignancy. Further correlation with enhanced MRI is recommended.  The patient is status post cholecystectomy. A 2nd low-density lesion within right hepatic lobe  anteriorly measures 2 cm. Unenhanced spleen is unremarkable. Mild pancreatic atrophy. The left adrenal gland is unremarkable.  Kidneys shows bilateral cortical thinning. No nephrolithiasis. No hydronephrosis or hydroureter. No small bowel obstruction. Mild distended urinary bladder. Prostate gland measures 3.7 x 4.2 cm. Atherosclerotic calcifications of femoral arteries. No calcified calculi are noted within urinary bladder. No ureteral calculi are identified.  IMPRESSION: 1. There is low density lesion posterior aspect of right hepatic lobe measures 6.3 cm highly suspicious for primary or secondary malignancy. Further assessment with enhanced study is recommended. There is right adrenal lesion measures 6.7 x 3.7 cm. Primary or secondary malignancy is suspected. Further evaluation is recommended. 2. Extensive atherosclerotic vascular calcifications. 3. No hydronephrosis or  hydroureter. 4. Mild distended urinary bladder. These results were called by telephone at the time of interpretation on 03/24/2013 at 2:34 PM to Dr. Blane Ohara , who verbally acknowledged these results. Mild enlarged prostate gland.   Electronically Signed   By: Natasha Mead M.D.   On: 03/24/2013 14:35   Ct Chest Wo Contrast  03/24/2013   CLINICAL DATA:  Abnormal finding on the abdomen and pelvis CT. A low-density liver lesion suggests a primary versus metastatic neoplasm. Large adrenal mass on the right also evident on the CT. Patient has no chest complaints.  EXAM: CT CHEST WITHOUT CONTRAST  TECHNIQUE: Multidetector CT imaging of the chest was performed following the standard protocol without IV contrast.  COMPARISON:  Abdomen and pelvis CT, 03/24/2013  FINDINGS: There are multiple bilateral calcified granulomas. There is no lung nodule or mass to suggest a primary lung malignancy as the source for the liver and adrenal lesions. There are changes of moderate centrilobular emphysema. Minor subsegmental atelectasis is noted at the bases.  There is no consolidation or edema. No pleural effusion or pneumothorax is seen.  The heart is mildly enlarged. There are dense coronary artery calcifications. No mediastinal or hilar masses or pathologically enlarged lymph nodes are appreciated.  There are no osteoblastic or osteolytic lesions.  IMPRESSION: 1. No lung mass or significant nodule is seen to suggest a primary lung malignancy as a source of the liver or adrenal lesions. There is no evidence of lung metastatic disease. 2. Multiple bilateral small calcified lung granulomas are noted. 3. There are changes of emphysema, but no acute findings in the lungs. 4. No mediastinal or hilar masses or enlarged lymph nodes.   Electronically Signed   By: Amie Portland M.D.   On: 03/24/2013 20:16   US Biopsy  03/25/2013   CLINICAL DATA:  Large right liver mass, adrenal mass, unknown primary  EXAM: ULTRASOUND GUIDED CORE BIOPSY OF RIGHT LIVER MASS  MEDICATIONS: 2 mg IV Versed; 100 mcg IV Fentanyl  Total Moderate Sedation Time: 15 MIN  PROCEDURE: The procedure, risks, benefits, and alternatives were explained to the patient. Questions regarding the procedure were encouraged and answered. The patient understands and consents to the procedure.  The right upper quadrant was prepped with Betadine in a sterile fashion, and a sterile drape was applied covering the operative field. A sterile gown and sterile gloves were used for the procedure. Local anesthesia was provided with 1% Lidocaine.  Previous imaging reviewed. Preliminary ultrasound performed of the right upper quadrant. Large right liver mass demonstrated. Under sterile conditions and local anesthesia, a 17 gauge 6.8 cm access needle was advanced percutaneously into the liver lesion. Needle position confirmed with ultrasound. Images obtained for documentation. 2 18 gauge 3 cm core biopsies obtained. These were placed in formalin. Needle tract embolized with Gel-Foam. No immediate complication. Patient tolerated  the procedure well.  COMPLICATIONS: None.  FINDINGS: Imaging confirms needle placement in the large right liver mass.  IMPRESSION: Successful ultrasound large right liver mass 18 gauge core biopsy   Electronically Signed   By: Ruel Favors M.D.   On: 03/25/2013 14:36    CBC  Recent Labs Lab 03/28/13 0519 03/31/13 0750  WBC 29.9* 31.6*  HGB 15.2 14.7  HCT 54.2* 51.8  PLT 284 261  MCV 72.8* 74.0*  MCH 20.4* 21.0*  MCHC 28.0* 28.4*  RDW 20.3* 20.5*    Chemistries   Recent Labs Lab 03/28/13 0519 03/29/13 1002 03/29/13 2135 03/30/13 0532 03/31/13 0750  NA  143 140 136 139 131*  K 2.9* 3.1* 4.6 4.2 4.3  CL 98 101 100 101 95*  CO2 28 30 26 30 27   GLUCOSE 85 130* 93 87 108*  BUN 35* 28* 28* 26* 22  CREATININE 1.59* 1.37* 1.31 1.33 1.25  CALCIUM 9.1 9.2 8.8 8.9 8.8  MG 2.0  --   --   --   --   AST 18  --   --   --   --   ALT 11  --   --   --   --   ALKPHOS 123*  --   --   --   --   BILITOT 1.9*  --   --   --   --    ------------------------------------------------------------------------------------------------------------------ estimated creatinine clearance is 47.2 ml/min (by C-G formula based on Cr of 1.25). ------------------------------------------------------------------------------------------------------------------ No results found for this basename: HGBA1C,  in the last 72 hours ------------------------------------------------------------------------------------------------------------------ No results found for this basename: CHOL, HDL, LDLCALC, TRIG, CHOLHDL, LDLDIRECT,  in the last 72 hours ------------------------------------------------------------------------------------------------------------------ No results found for this basename: TSH, T4TOTAL, FREET3, T3FREE, THYROIDAB,  in the last 72 hours ------------------------------------------------------------------------------------------------------------------ No results found for this basename: VITAMINB12,  FOLATE, FERRITIN, TIBC, IRON, RETICCTPCT,  in the last 72 hours  Coagulation profile No results found for this basename: INR, PROTIME,  in the last 168 hours  No results found for this basename: DDIMER,  in the last 72 hours  Cardiac Enzymes No results found for this basename: CK, CKMB, TROPONINI, MYOGLOBIN,  in the last 168 hours ------------------------------------------------------------------------------------------------------------------ No components found with this basename: POCBNP,

## 2013-04-06 ENCOUNTER — Emergency Department (HOSPITAL_COMMUNITY)
Admission: EM | Admit: 2013-04-06 | Discharge: 2013-04-06 | Disposition: A | Discharge status: Home / Self Care | Attending: Emergency Medicine | Admitting: Emergency Medicine

## 2013-04-06 ENCOUNTER — Encounter (HOSPITAL_COMMUNITY): Payer: Self-pay | Admitting: Emergency Medicine

## 2013-04-06 ENCOUNTER — Emergency Department (HOSPITAL_COMMUNITY)

## 2013-04-06 DIAGNOSIS — C229 Malignant neoplasm of liver, not specified as primary or secondary: Secondary | ICD-10-CM | POA: Insufficient documentation

## 2013-04-06 DIAGNOSIS — IMO0002 Reserved for concepts with insufficient information to code with codable children: Secondary | ICD-10-CM | POA: Insufficient documentation

## 2013-04-06 DIAGNOSIS — S59909A Unspecified injury of unspecified elbow, initial encounter: Secondary | ICD-10-CM | POA: Insufficient documentation

## 2013-04-06 DIAGNOSIS — Z87891 Personal history of nicotine dependence: Secondary | ICD-10-CM | POA: Insufficient documentation

## 2013-04-06 DIAGNOSIS — Z9861 Coronary angioplasty status: Secondary | ICD-10-CM | POA: Insufficient documentation

## 2013-04-06 DIAGNOSIS — Z862 Personal history of diseases of the blood and blood-forming organs and certain disorders involving the immune mechanism: Secondary | ICD-10-CM | POA: Insufficient documentation

## 2013-04-06 DIAGNOSIS — Z794 Long term (current) use of insulin: Secondary | ICD-10-CM | POA: Insufficient documentation

## 2013-04-06 DIAGNOSIS — N183 Chronic kidney disease, stage 3 unspecified: Secondary | ICD-10-CM | POA: Insufficient documentation

## 2013-04-06 DIAGNOSIS — Y939 Activity, unspecified: Secondary | ICD-10-CM | POA: Insufficient documentation

## 2013-04-06 DIAGNOSIS — G4700 Insomnia, unspecified: Secondary | ICD-10-CM | POA: Insufficient documentation

## 2013-04-06 DIAGNOSIS — M109 Gout, unspecified: Secondary | ICD-10-CM | POA: Insufficient documentation

## 2013-04-06 DIAGNOSIS — F411 Generalized anxiety disorder: Secondary | ICD-10-CM | POA: Insufficient documentation

## 2013-04-06 DIAGNOSIS — I252 Old myocardial infarction: Secondary | ICD-10-CM | POA: Insufficient documentation

## 2013-04-06 DIAGNOSIS — W050XXA Fall from non-moving wheelchair, initial encounter: Secondary | ICD-10-CM | POA: Insufficient documentation

## 2013-04-06 DIAGNOSIS — Z8669 Personal history of other diseases of the nervous system and sense organs: Secondary | ICD-10-CM | POA: Insufficient documentation

## 2013-04-06 DIAGNOSIS — I509 Heart failure, unspecified: Secondary | ICD-10-CM | POA: Insufficient documentation

## 2013-04-06 DIAGNOSIS — Z856 Personal history of leukemia: Secondary | ICD-10-CM | POA: Insufficient documentation

## 2013-04-06 DIAGNOSIS — Y92009 Unspecified place in unspecified non-institutional (private) residence as the place of occurrence of the external cause: Secondary | ICD-10-CM | POA: Insufficient documentation

## 2013-04-06 DIAGNOSIS — S6990XA Unspecified injury of unspecified wrist, hand and finger(s), initial encounter: Secondary | ICD-10-CM | POA: Insufficient documentation

## 2013-04-06 DIAGNOSIS — Z8619 Personal history of other infectious and parasitic diseases: Secondary | ICD-10-CM | POA: Insufficient documentation

## 2013-04-06 DIAGNOSIS — N400 Enlarged prostate without lower urinary tract symptoms: Secondary | ICD-10-CM | POA: Insufficient documentation

## 2013-04-06 DIAGNOSIS — S88119A Complete traumatic amputation at level between knee and ankle, unspecified lower leg, initial encounter: Secondary | ICD-10-CM | POA: Insufficient documentation

## 2013-04-06 DIAGNOSIS — E119 Type 2 diabetes mellitus without complications: Secondary | ICD-10-CM | POA: Insufficient documentation

## 2013-04-06 DIAGNOSIS — I2510 Atherosclerotic heart disease of native coronary artery without angina pectoris: Secondary | ICD-10-CM | POA: Insufficient documentation

## 2013-04-06 DIAGNOSIS — I129 Hypertensive chronic kidney disease with stage 1 through stage 4 chronic kidney disease, or unspecified chronic kidney disease: Secondary | ICD-10-CM | POA: Insufficient documentation

## 2013-04-06 DIAGNOSIS — Z951 Presence of aortocoronary bypass graft: Secondary | ICD-10-CM | POA: Insufficient documentation

## 2013-04-06 DIAGNOSIS — Z79899 Other long term (current) drug therapy: Secondary | ICD-10-CM | POA: Insufficient documentation

## 2013-04-06 LAB — BASIC METABOLIC PANEL
CO2: 26 mEq/L (ref 19–32)
Calcium: 8.6 mg/dL (ref 8.4–10.5)
Chloride: 108 mEq/L (ref 96–112)
Creatinine, Ser: 1.12 mg/dL (ref 0.50–1.35)
GFR calc Af Amer: 73 mL/min — ABNORMAL LOW (ref >90)
GFR calc non Af Amer: 63 mL/min — ABNORMAL LOW (ref >90)

## 2013-04-06 LAB — CBC WITH DIFFERENTIAL/PLATELET
Basophils Absolute: 0 10*3/uL (ref 0.0–0.1)
Eosinophils Absolute: 0.2 10*3/uL (ref 0.0–0.7)
HCT: 50.5 % (ref 39.0–52.0)
Lymphocytes Relative: 6 % — ABNORMAL LOW (ref 12–46)
MCHC: 28.1 g/dL — ABNORMAL LOW (ref 30.0–36.0)
Monocytes Relative: 4 % (ref 3–12)
Neutro Abs: 20 10*3/uL — ABNORMAL HIGH (ref 1.7–7.7)
Neutrophils Relative %: 89 % — ABNORMAL HIGH (ref 43–77)
Platelets: 244 10*3/uL (ref 150–400)
RDW: 21.2 % — ABNORMAL HIGH (ref 11.5–15.5)
WBC: 22.5 10*3/uL — ABNORMAL HIGH (ref 4.0–10.5)

## 2013-04-06 LAB — URIC ACID: Uric Acid, Serum: 3 mg/dL — ABNORMAL LOW (ref 4.0–7.8)

## 2013-04-06 MED ORDER — SODIUM CHLORIDE 0.9 % IJ SOLN
10.0000 mL | INTRAMUSCULAR | Status: DC | PRN
  Administered 2013-04-06: 20 mL

## 2013-04-06 MED ORDER — CEPHALEXIN 500 MG PO CAPS: 500.0000 mg | ORAL_CAPSULE | Freq: Four times a day (QID) | ORAL | Status: AC

## 2013-04-06 MED ORDER — PREDNISONE 20 MG PO TABS: ORAL_TABLET | ORAL | Status: AC

## 2013-04-06 NOTE — ED Provider Notes (Signed)
CSN: [id number]     Arrival date & time 04/06/13  1034 History   First MD Initiated Contact with Patient 04/06/13 1049     Chief Complaint  Patient presents with  . Fall  . Arm Pain   (Consider location/radiation/quality/duration/timing/severity/associated sxs/prior Treatment) Patient is a 72 y.o. male presenting with fall and arm pain. The history is provided by the patient and the spouse. No language interpreter was used.  Fall This is a new problem. The current episode started today. Pertinent negatives include no chills or fever. Associated symptoms comments: The patient fell from his wheelchair this afternoon injuring his left arm. He fell forward landing on the left arm. He has a PICC line in this arm on a Dilaudid pump in treatment of end stage cancer. He is at home with Hospice care in-home. His only complaint from the fall is pain in the left elbow and shoulder. No head or neck pain. No chest or abdominal pain. . The symptoms are aggravated by bending.  Arm Pain Pertinent negatives include no chills or fever.    Past Medical History  Diagnosis Date  . S/P BKA (below knee amputation)   . History of cholecystectomy   . Hx of CABG   . PVD (peripheral vascular disease)   . Peripheral neuropathy   . Dyslipidemia   . Hyperlipidemia   . Hypercholesterolemia   . Chest pain   . CAD (coronary artery disease)     CABG - 1993  /  PTCA - 2004  /  Cath - 2006  . Gout     takes Allopurinol daily  . CHF (congestive heart failure)   . Polycythemia   . Atrial fibrillation     not on Coumadin  . MI (myocardial infarction) 1991  . Bruises easily   . Constipation     related to pain meds-takes OTC stool softener daily  . Peripheral edema     takes Torsemide prn  . Urinary frequency   . Enlarged prostate     takes flomax daily  . Anxiety     takes Xanax daily  . Insomnia     Ambien prn;but none if over a yr  . Anginal pain     Take isosorbide  . Shingles rash   . CLL (chronic  lymphocytic leukemia)   . Arthritis   . Diabetes mellitus     takes Novolin 70/30;average fasting 100-120  . Hypertension     takes Atenolol,Maxzide,and Isosorbide daily  . Chronic kidney disease, stage III (moderate)   . Bilateral renal artery stenosis     status post stents   Past Surgical History  Procedure Laterality Date  . Coronary angioplasty with stent placement  2004    second diagonal artery -- [name], M.D.   . Cardiac catheterization  2006    Est. EF of 65% -- Diffuse coronary artery disease.  The second diagonal artery that was angioplastied in 2004 is now occluded.  It is no longer a candidate for PTCA since it is now flush occluded.  Normal LV systolic function.  [name], M.D.  . Renal artery stent  2003    Bilateral renal artery stenosis  . Total knee arthroplasty      left  . Distal clavicle excision  2003    SURGEON:  [name], M.D.  . Tendon repair      Left Achilles repair x2  . Coronary artery bypass graft  1993    left  internal mammary artery to his LAD artery --   . Coronary artery bypass graft  1994    vein graft failure of in his right coronary artery  . Colonoscopy    . Esophagogastroduodenoscopy    . I&d extremity  09/10/2011    Procedure: IRRIGATION AND DEBRIDEMENT EXTREMITY;  Surgeon: Sherren Kerns, MD;  Location: [facility] OR;  Service: Vascular;  Laterality: Left;  OF BKA  . Pr vein bypass graft,aorto-fem-pop    . Joint replacement  1998    Left knee  . Below knee leg amputation      left  . Cholecystectomy    . I&d extremity  04/09/2012    Procedure: IRRIGATION AND DEBRIDEMENT EXTREMITY;  Surgeon: Sharma Covert, MD;  Location: [facility] OR;  Service: Orthopedics;  Laterality: Bilateral;  right middle finger, left index finger   Family History  Problem Relation Age of Onset  . Coronary artery disease Father   . Heart disease Father   . Hypertension Father   . Heart attack Father   . Coronary artery disease Mother   . Heart  failure Mother   . Dementia Mother   . Heart disease Mother     Heart disease before age 58  . Hypertension Mother   . Heart attack Mother   . Diabetes      siblings  . Hypertension Daughter   . Anesthesia problems Neg Hx   . Hypotension Neg Hx   . Malignant hyperthermia Neg Hx   . Pseudochol deficiency Neg Hx   . Heart disease Brother   . Hypertension Brother    History  Substance Use Topics  . Smoking status: Former Smoker -- 3.00 packs/day for 25 years    Types: Cigarettes    Quit date: 04/14/1976  . Smokeless tobacco: Never Used  . Alcohol Use: No    Review of Systems  Constitutional: Negative for fever and chills.  HENT: Negative.   Respiratory: Negative.   Cardiovascular: Negative.   Gastrointestinal: Negative.   Musculoskeletal: Negative.        See HPI  Skin: Negative.   Neurological: Negative.     Allergies  Amiodarone; Colchicine; Crestor; Lipitor; Lisinopril; Lyrica; Mevacor; Micardis; Neurontin; Norvasc; Potassium chloride er; Ranolazine er; and Zocor  Home Medications   Current Outpatient Rx  Name  Route  Sig  Dispense  Refill  . allopurinol (ZYLOPRIM) 100 MG tablet   Oral   Take 100 mg by mouth daily.         Marland Kitchen ALPRAZolam (XANAX) 0.25 MG tablet   Oral   Take 0.25 mg by mouth every 4 (four) hours as needed for anxiety.         Marland Kitchen atenolol (TENORMIN) 50 MG tablet   Oral   Take 50 mg by mouth daily.         . bisacodyl (BISACODYL) 5 MG EC tablet   Oral   Take 5 mg by mouth daily as needed. For constipation         . insulin NPH-insulin regular (NOVOLIN 70/30) (70-30) 100 UNIT/ML injection   Subcutaneous   Inject 20-58 Units into the skin 2 (two) times daily. Sliding scale         . isosorbide mononitrate (IMDUR) 30 MG 24 hr tablet   Oral   Take 30 mg by mouth every evening.          . methocarbamol (ROBAXIN) 500 MG tablet   Oral   Take 500 mg by mouth every  6 (six) hours as needed for muscle spasms.          . ondansetron  (ZOFRAN) 4 MG tablet   Oral   Take 1 tablet (4 mg total) by mouth every 6 (six) hours as needed for nausea.   20 tablet   0   . sodium chloride 0.9 % SOLN 47.5 mL with HYDROmorphone 10 MG/ML SOLN 25 mg   Intravenous   Inject 0.5 mg/hr into the vein continuous. [facility] pharmacy to determine concentration of infusion. Deliver via CADD pump. Dispense per pharmacy.   999 Device   0   . Tamsulosin HCl (FLOMAX) 0.4 MG CAPS   Oral   Take 0.8 mg by mouth daily.           BP 136/64  Pulse 100  Temp(Src) 98.3 F (36.8 C)  Resp 18  SpO2 88% Physical Exam  Constitutional: He is oriented to person, place, and time. He appears well-developed and well-nourished.  Cachectic appearing man, somnolent but easily waken and oriented.  HENT:  Head: Normocephalic and atraumatic.  Eyes: Conjunctivae are normal.  Neck: Normal range of motion.  Cardiovascular: Normal rate.   No murmur heard. Pulmonary/Chest: Effort normal. He has no wheezes. He has rales. He exhibits no tenderness.  Abdominal: Soft.  RUQ tenderness with palpable RUQ mass c/w hepatic involvement of cancer.  Musculoskeletal:  PICC in place in Left upper arm. Site unremarkable without bleeding. There is moderate to significnat redness and swelling of arm from elbow into and including hand, unchanged per family at bedside. Elbow significantly swollen and fluctuant over olecranon process and tender. FROM and full use of left upper extremity. Abrasion over shoulder with mild bruising and posterior swelling. No bony deformities to left UE.  Neurological: He is alert and oriented to person, place, and time.    ED Course  Procedures (including critical care time) Labs Review Labs Reviewed  CBC WITH DIFFERENTIAL - Abnormal; Notable for the following:    WBC 22.5 (*)    RBC 6.84 (*)    MCV 73.8 (*)    MCH 20.8 (*)    MCHC 28.1 (*)    RDW 21.2 (*)    All other components within normal limits  BASIC METABOLIC PANEL - Abnormal; Notable for  the following:    GFR calc non Af Amer 63 (*)    GFR calc Af Amer 73 (*)    All other components within normal limits  URIC ACID - Abnormal; Notable for the following:    Uric Acid, Serum 3.0 (*)    All other components within normal limits   Imaging Review Dg Elbow Complete Left  04/06/2013   CLINICAL DATA:  Fall with left elbow pain.  EXAM: LEFT ELBOW - COMPLETE 3+ VIEW  COMPARISON:  None.  FINDINGS: Potential subtle avulsive fracture off of the outer aspect of the medial humeral epicondyle. There is suggestion of overlying soft tissue swelling. No joint effusion is identified.  IMPRESSION: Possible avulsive fracture off of the medial humeral epicondyle.   Electronically Signed   By: Irish Lack M.D.   On: 04/06/2013 12:20   Dg Shoulder Left  04/06/2013   CLINICAL DATA:  Fall with injury and left shoulder pain.  EXAM: LEFT SHOULDER - 2+ VIEW  COMPARISON:  None.  FINDINGS: No acute fracture or dislocation is identified. Proliferative changes are seen involving the [facility] joint. The [facility] joint shows normal alignment. Soft tissues are unremarkable. No bony lesions are identified.  IMPRESSION: No  acute fracture.   Electronically Signed   By: Irish Lack M.D.   On: 04/06/2013 12:17    EKG Interpretation   None       MDM  No diagnosis found. 1. Fall 2. Left UE pain 3. End stage cancer on Hospice  He has a history of gouty arthritis and the olecranon is fluctuant and swollen c/w previous gout symptoms. ? Avulsion fracture of humeral epicondyl. Dr. Lynelle Doctor in to see patient. Opt to treat with course of steroids and cover with abx given the redness to distal upper extremity.    Arnoldo Hooker, PA-C 04/06/13 1442

## 2013-04-06 NOTE — Progress Notes (Signed)
ED nurse paged the IV team to assess the pt's PICC line after her fell on his left arm at home.  Arrived to find that the pt's PICC line remained intact. Below the PICC dressing down to his hand was red and edematous and tender to touch. I assessed the dressing and the insertion site the pt's skin around the PICC and above the sight was of normal skin tone.  No signs of infection or trauma to the PICC or the insertion site.  I flushed the PICC x 2 with 10 cc of NS and it had a good blood return.  Advised the nurse, family and the pt of my assessment. Consuello Masse

## 2013-04-06 NOTE — ED Provider Notes (Addendum)
Pt fell from his WC and has ? Injury to his left arm. Pt is on dilaudid pump in PICC in his left arm. However he has marked redness and swelling of the olecranon bursa on his left arm that is c/w acute gouty tophi. Family report hx of gout before and he has a non inflammed tophi in his right elbow.   Medical screening examination/treatment/procedure(s) were conducted as a shared visit with non-physician practitioner(s) and myself.  I personally evaluated the patient during the encounter.  EKG Interpretation   None        Devoria Albe, MD, FACEP    Ward Givens, MD 04/06/13 1444  Ward Givens, MD 04/22/13 [phone]

## 2013-04-06 NOTE — ED Notes (Signed)
[facility] called  

## 2013-04-06 NOTE — ED Notes (Signed)
Shari PA at beside informing pt.s family about Xray results.

## 2013-04-06 NOTE — ED Notes (Signed)
Per [facility] sts that pt had a fall this am and possibly injured left arm. Pt also has redness, and swelling to right arm and warm to touch. Pt from home and hospice pt. Pt has PICC line in left arm also which he receives dilaudid PCA.

## 2013-04-16 NOTE — Progress Notes (Signed)
I have reviewed and discussed the care of this patient in detail with the nurse practitioner including pertinent patient records, physical exam findings and data. I agree with details of this encounter.  

## 2013-04-27 ENCOUNTER — Telehealth: Payer: Self-pay

## 2013-04-27 NOTE — Telephone Encounter (Signed)
Patient past away @ [facility] per [facility] in [employer] °

## 2013-05-15 DEATH — deceased

## 2013-06-05 IMAGING — CR DG CHEST 2V
3 series · 3 of 3 positions shown · non-contrast
Comparison: 11/06/2010.

CLINICAL DATA: Preoperative evaluation.  Ex-smoker.

CHEST - 2 VIEW

[view not recorded (1 of 3)]
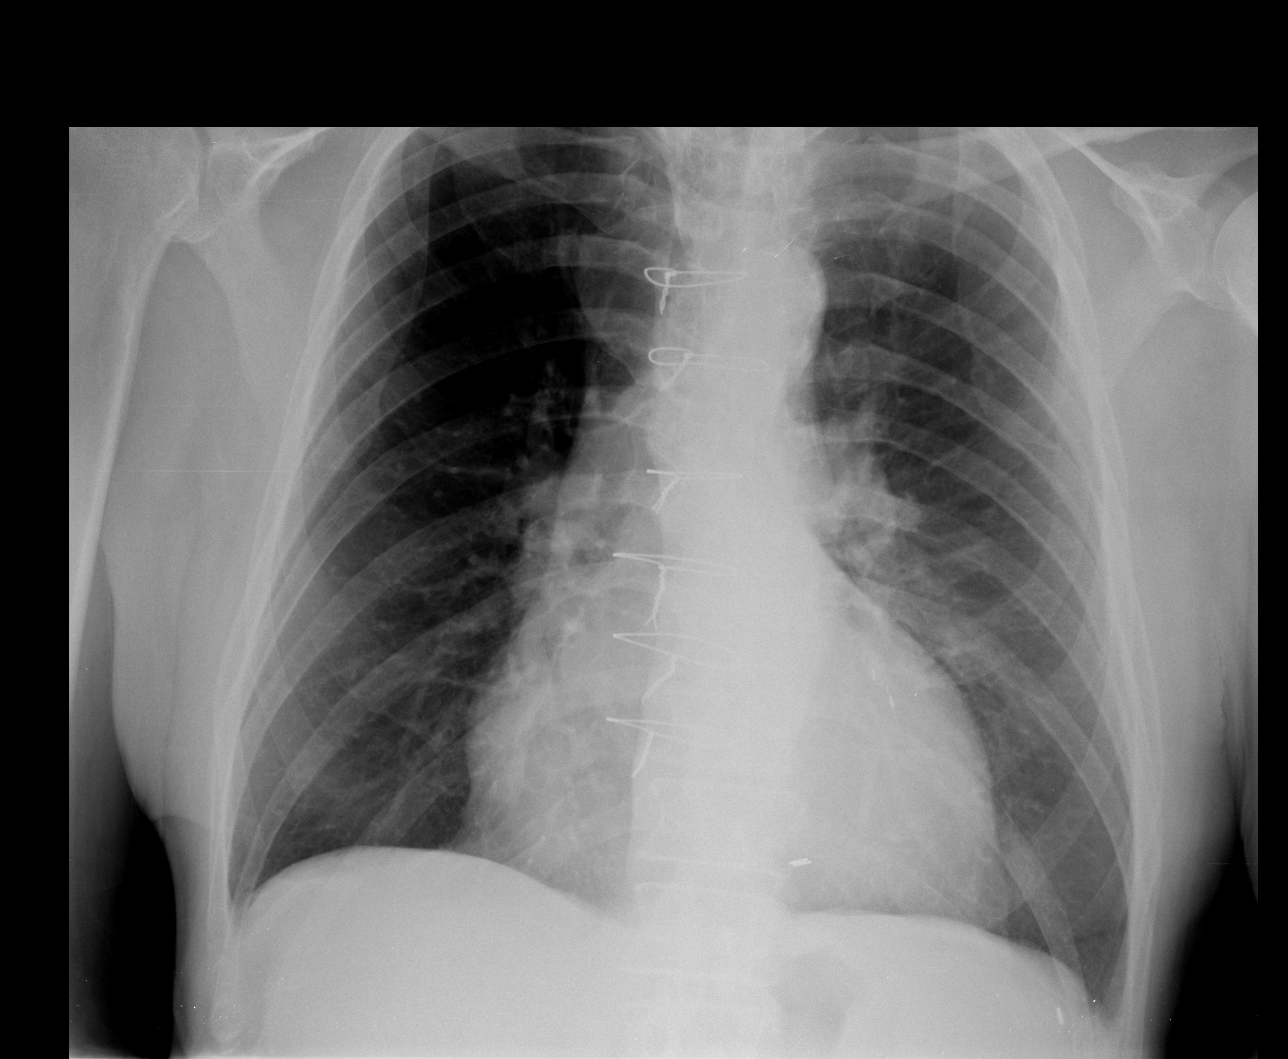

[view not recorded (2 of 3)]
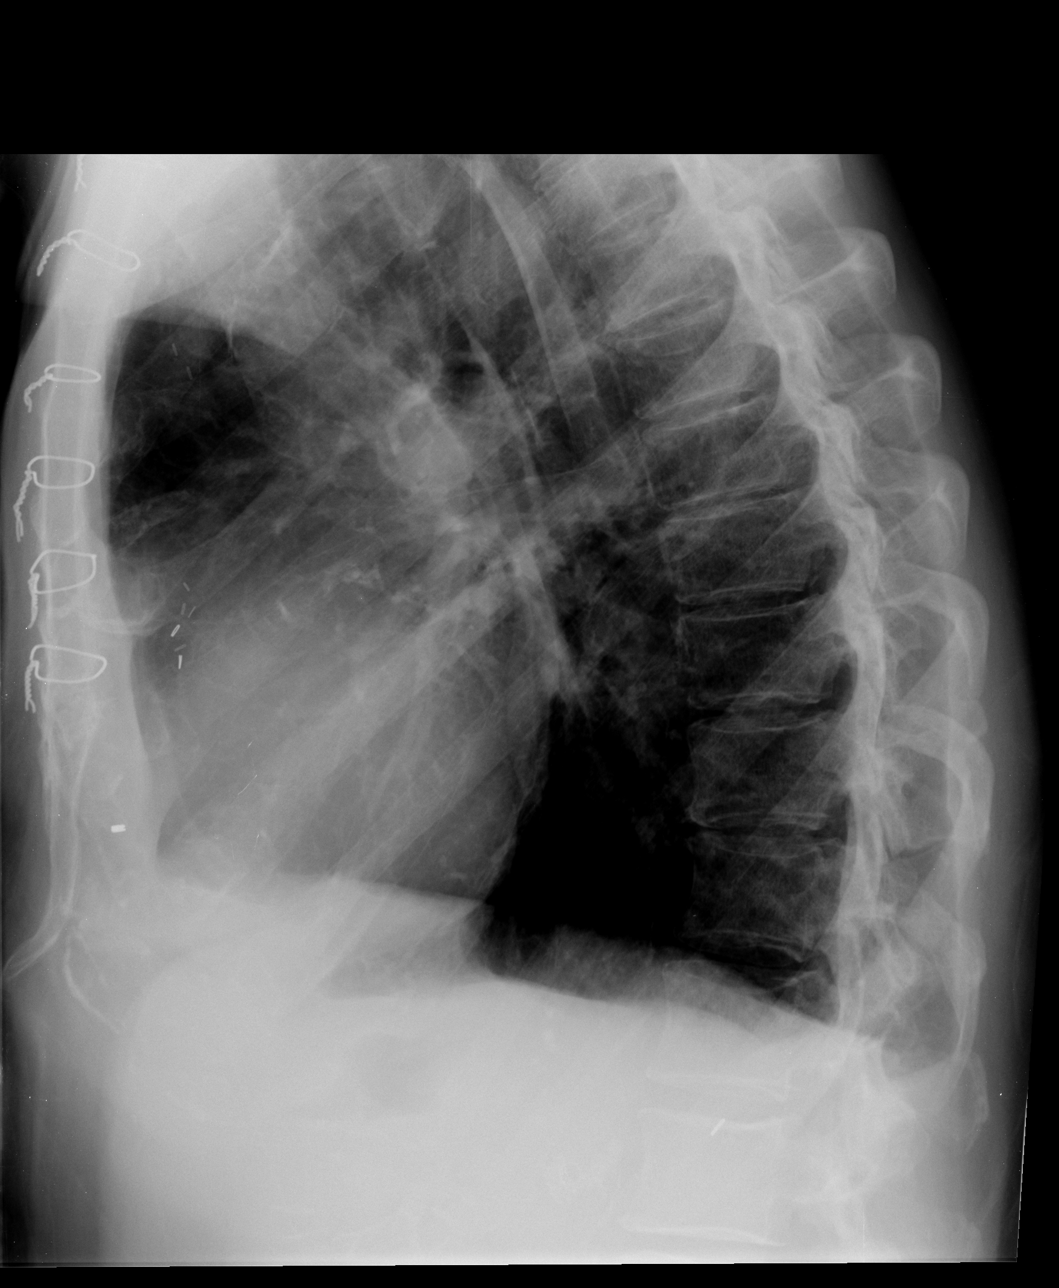

[view not recorded (3 of 3)]
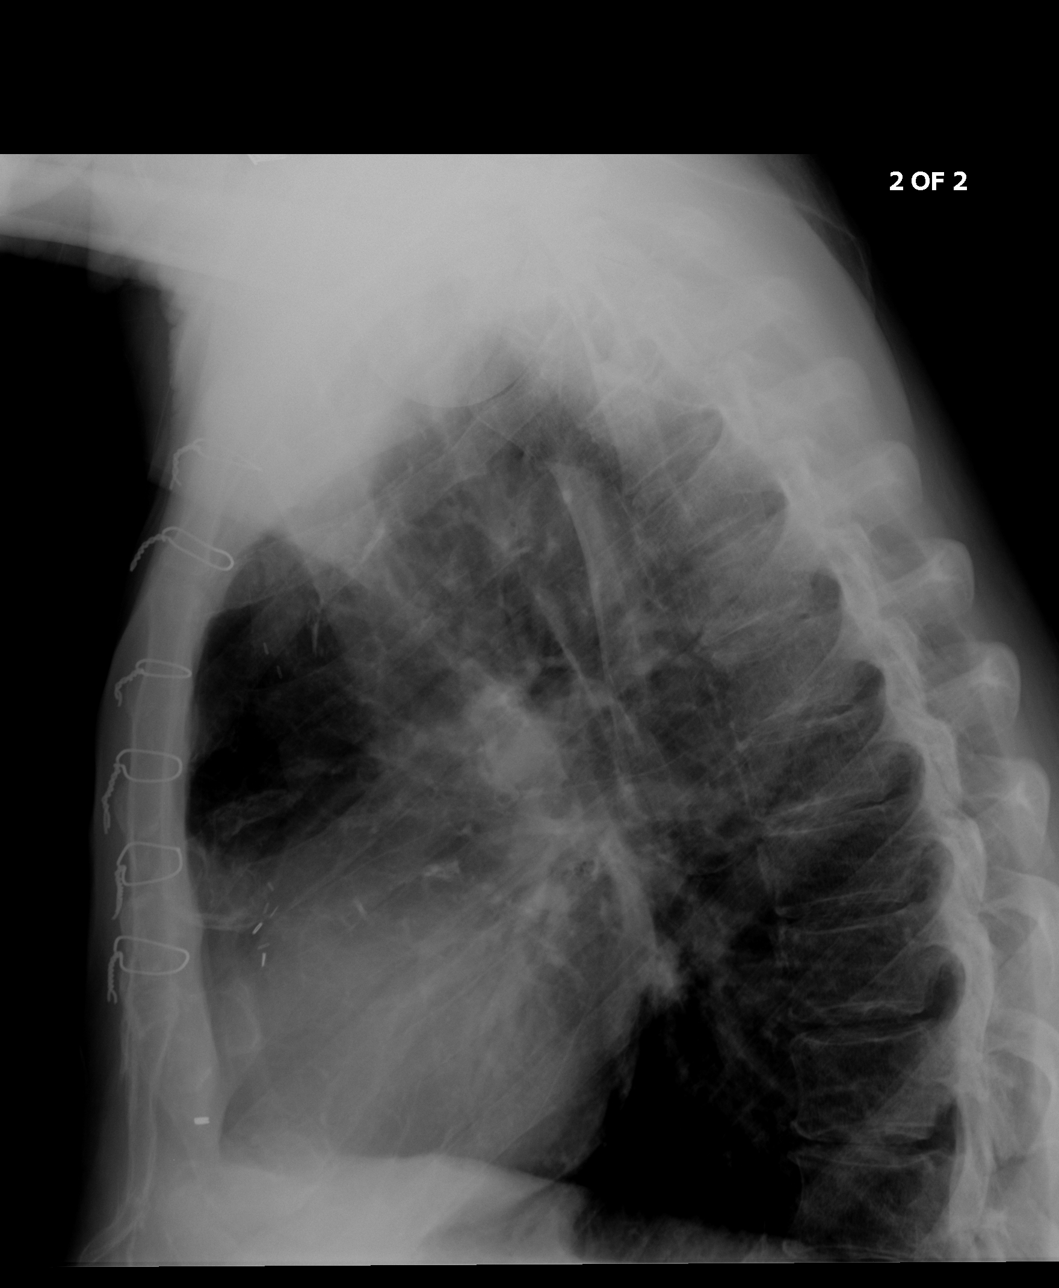

[3 of 3 positions shown; findings below may reference images not displayed]

FINDINGS: There is moderate cardiac silhouette enlargement
unchanged. The patient has undergone previous median sternotomy and
coronary artery bypass grafting.  No pulmonary infiltrates or
masses are seen. There is flattening of the diaphragm on lateral
image with generalized hyperinflation configuration consistent with
COPD.  Mediastinal and hilar contours appear stable.  Right
hemithorax is slightly more lucent than left hemithorax.  This
probably reflects mild rotation.  There has been previous resection
of the distal end of the right clavicle.
IMPRESSION: Moderate enlargement of the cardiac silhouette.  Generalized
hyperinflation consistent with COPD.  No pulmonary edema,
pneumonia, or other acute superimposed process is evident.  Stable
chronic findings are described above.

## 2013-06-06 ENCOUNTER — Other Ambulatory Visit: Payer: Medicare Other

## 2013-06-06 ENCOUNTER — Ambulatory Visit: Payer: Medicare Other | Admitting: Nurse Practitioner
